# Patient Record
Sex: Female | Born: 1970 | Race: White | Hispanic: No | Marital: Married | Smoking: Never smoker
Health system: Southern US, Community
[De-identification: ages and names within clinical notes are randomized; demographics above are authoritative.]

## PROBLEM LIST (undated history)

## (undated) DIAGNOSIS — K635 Polyp of colon: Secondary | ICD-10-CM

## (undated) DIAGNOSIS — R8789 Other abnormal findings in specimens from female genital organs: Secondary | ICD-10-CM

## (undated) DIAGNOSIS — F419 Anxiety disorder, unspecified: Secondary | ICD-10-CM

## (undated) DIAGNOSIS — Q211 Atrial septal defect: Secondary | ICD-10-CM

## (undated) DIAGNOSIS — T753XXA Motion sickness, initial encounter: Secondary | ICD-10-CM

## (undated) DIAGNOSIS — Z9889 Other specified postprocedural states: Secondary | ICD-10-CM

## (undated) DIAGNOSIS — K219 Gastro-esophageal reflux disease without esophagitis: Secondary | ICD-10-CM

## (undated) DIAGNOSIS — I639 Cerebral infarction, unspecified: Secondary | ICD-10-CM

## (undated) DIAGNOSIS — Q2112 Patent foramen ovale: Secondary | ICD-10-CM

## (undated) DIAGNOSIS — Z8619 Personal history of other infectious and parasitic diseases: Secondary | ICD-10-CM

## (undated) DIAGNOSIS — T8859XA Other complications of anesthesia, initial encounter: Secondary | ICD-10-CM

## (undated) DIAGNOSIS — T4145XA Adverse effect of unspecified anesthetic, initial encounter: Secondary | ICD-10-CM

## (undated) DIAGNOSIS — D649 Anemia, unspecified: Secondary | ICD-10-CM

## (undated) DIAGNOSIS — T8339XA Other mechanical complication of intrauterine contraceptive device, initial encounter: Secondary | ICD-10-CM

## (undated) DIAGNOSIS — Z803 Family history of malignant neoplasm of breast: Secondary | ICD-10-CM

## (undated) DIAGNOSIS — Z8632 Personal history of gestational diabetes: Secondary | ICD-10-CM

## (undated) DIAGNOSIS — Z973 Presence of spectacles and contact lenses: Secondary | ICD-10-CM

## (undated) DIAGNOSIS — J42 Unspecified chronic bronchitis: Secondary | ICD-10-CM

## (undated) DIAGNOSIS — J45909 Unspecified asthma, uncomplicated: Secondary | ICD-10-CM

## (undated) DIAGNOSIS — R7303 Prediabetes: Secondary | ICD-10-CM

## (undated) DIAGNOSIS — M1990 Unspecified osteoarthritis, unspecified site: Secondary | ICD-10-CM

## (undated) DIAGNOSIS — R87610 Atypical squamous cells of undetermined significance on cytologic smear of cervix (ASC-US): Secondary | ICD-10-CM

## (undated) DIAGNOSIS — G43909 Migraine, unspecified, not intractable, without status migrainosus: Secondary | ICD-10-CM

## (undated) DIAGNOSIS — R739 Hyperglycemia, unspecified: Secondary | ICD-10-CM

## (undated) DIAGNOSIS — R0789 Other chest pain: Secondary | ICD-10-CM

## (undated) DIAGNOSIS — R87612 Low grade squamous intraepithelial lesion on cytologic smear of cervix (LGSIL): Secondary | ICD-10-CM

## (undated) DIAGNOSIS — M5020 Other cervical disc displacement, unspecified cervical region: Secondary | ICD-10-CM

## (undated) DIAGNOSIS — Z8041 Family history of malignant neoplasm of ovary: Secondary | ICD-10-CM

## (undated) DIAGNOSIS — M722 Plantar fascial fibromatosis: Secondary | ICD-10-CM

## (undated) HISTORY — DX: Other cervical disc displacement, unspecified cervical region: M50.20

## (undated) HISTORY — PX: CERVICAL CONIZATION W/BX: SHX1330

## (undated) HISTORY — DX: Other abnormal findings in specimens from female genital organs: R87.89

## (undated) HISTORY — DX: Low grade squamous intraepithelial lesion on cytologic smear of cervix (LGSIL): R87.612

## (undated) HISTORY — DX: Other chest pain: R07.89

## (undated) HISTORY — DX: Atypical squamous cells of undetermined significance on cytologic smear of cervix (ASC-US): R87.610

## (undated) HISTORY — PX: KNEE SURGERY: SHX244

## (undated) HISTORY — DX: Personal history of gestational diabetes: Z86.32

## (undated) HISTORY — DX: Plantar fascial fibromatosis: M72.2

## (undated) HISTORY — DX: Family history of malignant neoplasm of ovary: Z80.41

## (undated) HISTORY — DX: Family history of malignant neoplasm of breast: Z80.3

---

## 2007-01-08 ENCOUNTER — Ambulatory Visit: Payer: Self-pay | Admitting: Specialist

## 2007-01-26 ENCOUNTER — Ambulatory Visit: Payer: Self-pay | Admitting: Specialist

## 2007-10-16 ENCOUNTER — Ambulatory Visit: Payer: Self-pay | Admitting: Obstetrics and Gynecology

## 2007-11-29 ENCOUNTER — Ambulatory Visit: Payer: Self-pay | Admitting: Obstetrics and Gynecology

## 2008-01-15 ENCOUNTER — Observation Stay: Payer: Self-pay | Admitting: Obstetrics and Gynecology

## 2008-02-26 ENCOUNTER — Ambulatory Visit: Payer: Self-pay | Admitting: Obstetrics and Gynecology

## 2008-03-03 ENCOUNTER — Inpatient Hospital Stay: Payer: Self-pay | Admitting: Obstetrics and Gynecology

## 2008-04-08 ENCOUNTER — Ambulatory Visit: Payer: Self-pay | Admitting: Obstetrics and Gynecology

## 2010-01-19 ENCOUNTER — Ambulatory Visit: Payer: Self-pay | Admitting: Unknown Physician Specialty

## 2010-08-05 ENCOUNTER — Emergency Department: Payer: Self-pay | Admitting: Emergency Medicine

## 2010-11-01 ENCOUNTER — Observation Stay: Payer: Self-pay | Admitting: Obstetrics and Gynecology

## 2010-12-20 ENCOUNTER — Ambulatory Visit: Payer: Self-pay | Admitting: Obstetrics and Gynecology

## 2010-12-31 ENCOUNTER — Ambulatory Visit: Payer: Self-pay | Admitting: Obstetrics and Gynecology

## 2011-01-17 ENCOUNTER — Observation Stay: Payer: Self-pay

## 2011-01-30 ENCOUNTER — Ambulatory Visit: Payer: Self-pay | Admitting: Obstetrics and Gynecology

## 2011-02-11 ENCOUNTER — Inpatient Hospital Stay: Payer: Self-pay | Admitting: Internal Medicine

## 2012-09-20 DIAGNOSIS — R87612 Low grade squamous intraepithelial lesion on cytologic smear of cervix (LGSIL): Secondary | ICD-10-CM

## 2012-09-20 HISTORY — DX: Low grade squamous intraepithelial lesion on cytologic smear of cervix (LGSIL): R87.612

## 2013-01-30 DIAGNOSIS — R87610 Atypical squamous cells of undetermined significance on cytologic smear of cervix (ASC-US): Secondary | ICD-10-CM

## 2013-01-30 HISTORY — DX: Atypical squamous cells of undetermined significance on cytologic smear of cervix (ASC-US): R87.610

## 2013-03-21 ENCOUNTER — Ambulatory Visit: Payer: Self-pay | Admitting: Obstetrics & Gynecology

## 2013-04-01 ENCOUNTER — Ambulatory Visit: Payer: Self-pay | Admitting: Obstetrics & Gynecology

## 2013-04-11 ENCOUNTER — Observation Stay: Payer: Self-pay | Admitting: Obstetrics and Gynecology

## 2013-05-06 ENCOUNTER — Inpatient Hospital Stay: Payer: Self-pay

## 2013-05-06 LAB — CBC WITH DIFFERENTIAL/PLATELET
Basophil #: 0.1 10*3/uL (ref 0.0–0.1)
Basophil %: 0.5 %
Eosinophil #: 0.1 10*3/uL (ref 0.0–0.7)
Eosinophil %: 0.5 %
HGB: 13.7 g/dL (ref 12.0–16.0)
Lymphocyte #: 3 10*3/uL (ref 1.0–3.6)
MCH: 30 pg (ref 26.0–34.0)
MCHC: 34.5 g/dL (ref 32.0–36.0)
Neutrophil #: 9.1 10*3/uL — ABNORMAL HIGH (ref 1.4–6.5)
Platelet: 187 10*3/uL (ref 150–440)

## 2013-05-07 DIAGNOSIS — O24419 Gestational diabetes mellitus in pregnancy, unspecified control: Secondary | ICD-10-CM

## 2013-05-08 LAB — HEMATOCRIT: HCT: 31.8 % — ABNORMAL LOW (ref 35.0–47.0)

## 2014-09-03 ENCOUNTER — Ambulatory Visit (INDEPENDENT_AMBULATORY_CARE_PROVIDER_SITE_OTHER): Payer: PRIVATE HEALTH INSURANCE

## 2014-09-03 ENCOUNTER — Ambulatory Visit (INDEPENDENT_AMBULATORY_CARE_PROVIDER_SITE_OTHER): Payer: PRIVATE HEALTH INSURANCE | Admitting: Podiatry

## 2014-09-03 ENCOUNTER — Encounter: Payer: Self-pay | Admitting: Podiatry

## 2014-09-03 VITALS — BP 112/68 | HR 73 | Resp 16 | Ht 64.0 in | Wt 185.0 lb

## 2014-09-03 DIAGNOSIS — M722 Plantar fascial fibromatosis: Secondary | ICD-10-CM

## 2014-09-03 MED ORDER — MELOXICAM 15 MG PO TABS: 15.0000 mg | ORAL_TABLET | Freq: Every day | ORAL | Status: DC

## 2014-09-03 MED ORDER — METHYLPREDNISOLONE (PAK) 4 MG PO TABS: ORAL_TABLET | ORAL | Status: DC

## 2014-09-03 NOTE — Patient Instructions (Signed)

## 2014-09-03 NOTE — Progress Notes (Signed)
   Subjective:    Patient ID: Tammy PontiffAngelic R Sosa, female    DOB: 12/02/1970, 44 y.o.   MRN: [id number]  HPI Comments: Left heel pain for a couple of months , only getting worse. Can barely put foot down on the ground. It is throbbing now sitting in this chair   Foot Pain      Review of Systems  All other systems reviewed and are negative.      Objective:   Physical Exam: I have reviewed her past medical history medications allergies surgery social history and review of systems. Pulses are strongly palpable bilateral. Neurologic sensorium is intact per Semmes-Weinstein monofilament. Deep tendon reflexes are intact bilateral and muscle strength is 5 over 5 dorsiflexion plantar flexors and inverters everters all just musculature is intact. [facility] on palpation medially continue tubercle of the left heel. Radiographic evaluation Mr. is a soft tissue increase in density of the plantar fascial insertion site of the left heel. Otherwise the foot appears to be rectus with normal osseous architecture.        Assessment & Plan:  Assessment: Plantar fasciitis left.  Plan: We discussed the etiology pathology conservative versus surgical therapies. We discussed appropriate shoe gear stretching exercises ice therapy and shoe modifications. I injected her left heel with Kenalog and local anesthetic today. Placed her in a plantar fascial brace and a night splint. We discussed medications which will include a Medrol Dosepak to be followed by meloxicam. I encouraged her to wear appropriate shoe gear and discussed that in detail I will follow-up with her in 1 month

## 2014-10-01 ENCOUNTER — Encounter: Payer: Self-pay | Admitting: Podiatry

## 2014-10-01 ENCOUNTER — Ambulatory Visit (INDEPENDENT_AMBULATORY_CARE_PROVIDER_SITE_OTHER): Payer: PRIVATE HEALTH INSURANCE | Admitting: Podiatry

## 2014-10-01 VITALS — BP 84/55 | HR 69 | Resp 16

## 2014-10-01 DIAGNOSIS — M722 Plantar fascial fibromatosis: Secondary | ICD-10-CM

## 2014-10-01 MED ORDER — DICLOFENAC SODIUM 75 MG PO TBEC: 75.0000 mg | DELAYED_RELEASE_TABLET | Freq: Two times a day (BID) | ORAL | Status: DC

## 2014-10-01 NOTE — Progress Notes (Signed)
She presents today for follow-up of her plantar fasciitis left heel. She states it really has not helped a whole lot.  Objective: Vital signs are stable she is alert and oriented 3. Pulses are palpable left foot. On palpation medial calcaneal tubercle of the left heel.  Assessment: Plantar fasciitis of the left heel.  Plan: Encouraged her to try to use the night splint which she states she's not been using. I also encouraged her to wear her plantar fascial brace. Reinject the left heel today with Kenalog and local anesthetic in dispensed a prescription for diclofenac and we'll discontinue meloxicam. Follow-up with me in 1 month at which time we may need to consider orthotics.

## 2014-11-05 ENCOUNTER — Encounter: Payer: Self-pay | Admitting: Podiatry

## 2014-11-05 ENCOUNTER — Ambulatory Visit (INDEPENDENT_AMBULATORY_CARE_PROVIDER_SITE_OTHER): Payer: PRIVATE HEALTH INSURANCE | Admitting: Podiatry

## 2014-11-05 VITALS — BP 110/64 | HR 70 | Resp 16

## 2014-11-05 DIAGNOSIS — M722 Plantar fascial fibromatosis: Secondary | ICD-10-CM

## 2014-11-05 NOTE — Progress Notes (Signed)
She presents today for follow-up of plantar fasciitis to her left heel. She states that she does not want another injection. She states that she is feeling much better but she still has some pain.  Objective: Pulses are palpable left foot. She has pain on palpation medial calcaneal tubercle of the left heel.  Assessment: Biomechanical-induced chronic optimal plantar fasciitis left.  Plan: She is For set of orthotics today.

## 2014-12-01 ENCOUNTER — Ambulatory Visit (INDEPENDENT_AMBULATORY_CARE_PROVIDER_SITE_OTHER): Payer: PRIVATE HEALTH INSURANCE | Admitting: *Deleted

## 2014-12-01 DIAGNOSIS — M722 Plantar fascial fibromatosis: Secondary | ICD-10-CM

## 2014-12-01 NOTE — Progress Notes (Signed)
Orthotics dispensed. Gradual breakin instructions given. Recheck 1 month, if needed.

## 2014-12-01 NOTE — Patient Instructions (Signed)

## 2014-12-09 NOTE — H&P (Signed)
L&D Evaluation:  History:  HPI 44 yo [id number] at 9784w2d gestation by D=6wk US derived EDC of 05/21/2013 presenting after being seen in [facility] clinic today and reporting contraction every 5 minutes.  The patient was not checked at the time of her clinic visit. On arrival she was noted to be 1.5cm dilated.  Contractions have been off an on for the past 24-hrs.  No LOF, no VB, +FM.  [facility], AMA, GDM currently on glyburide 2.5mg  qHS.  US 04/11/13 2792 (6lbs 2oz) c/w 72%ile, and afi of 18cm.   Presents with contractions   Patient's Medical History No Chronic Illness   Patient's Surgical History knee surgery   Medications Pre Natal Vitamins  glyburide 2.5mg  qHS   Allergies NKDA   Social History none   Family History Non-Contributory   ROS:  ROS All systems were reviewed.  HEENT, CNS, GI, GU, Respiratory, CV, Renal and Musculoskeletal systems were found to be normal.   Exam:  Vital Signs stable   Urine Protein not completed   General no apparent distress   Mental Status clear   Chest no increased work of breathing   Abdomen gravid, non-tender   Estimated Fetal Weight Average for gestational age   Fetal Position vtx   Edema no edema   Pelvic no external lesions, 1.5/50/hi unchanged over 2-hrs   Mebranes Intact   FHT normal rate with no decels, negative contraction stress test   Ucx irregular   Skin q5-217min spaced out to q7510min on discharge   Impression:  Impression Braxton Hicks contractions   Plan:  Comments - routine labor precautions - follow up in clinic next week   Follow Up Appointment already scheduled   Electronic Signatures: Lorrene ReidStaebler, Merranda Bolls M (MD)  (Signed 12-Sep-14 21:16)  Authored: L&D Evaluation   Last Updated: 12-Sep-14 21:16 by Lorrene ReidStaebler, Mykle Pascua M (MD)

## 2014-12-09 NOTE — H&P (Signed)
L&D Evaluation:  History:  HPI 44 yo [id number] at 38 weeks who presents to L&D with c/o contractions. She reports +FM, denies vb or lof. She is O+, VI, RI, and GBS-. Her prenatal course is significant for gestational diabetes requiring glyburide daily for BG control, and AMA.   Presents with abdominal pain   Patient's Medical History No Chronic Illness  AMA   Patient's Surgical History knee surgery   Medications Pre Natal Vitamins   Allergies NKDA   Social History none   Family History Non-Contributory   ROS:  ROS All systems were reviewed.  HEENT, CNS, GI, GU, Respiratory, CV, Renal and Musculoskeletal systems were found to be normal.   Exam:  Vital Signs stable   General no apparent distress   Mental Status clear   Chest clear   Heart normal sinus rhythm   Abdomen gravid, tender with contractions   Back no CVAT   Pelvic no external lesions, cervix 2.5/70/0 on admission- at 2115- cervix 4/85/0   Mebranes Intact   FHT normal rate with no decels   Ucx regular, 2 min apart   Skin dry   Lymph no lymphadenopathy   Impression:  Impression active labor, reactive NST, IUP at 38 weeks, GDM   Plan:  Plan admit for labor   Follow Up Appointment need to schedule   Electronic Signatures: Jannet MantisSubudhi, Gethsemane Fischler (CNM)  (Signed 06-Oct-14 21:47)  Authored: L&D Evaluation   Last Updated: 06-Oct-14 21:47 by Jannet MantisSubudhi, Yuki Brunsman (CNM)

## 2014-12-11 DIAGNOSIS — R87618 Other abnormal cytological findings on specimens from cervix uteri: Secondary | ICD-10-CM

## 2014-12-11 HISTORY — DX: Other abnormal cytological findings on specimens from cervix uteri: R87.618

## 2014-12-11 LAB — HM PAP SMEAR: HM Pap smear: POSITIVE

## 2015-01-01 ENCOUNTER — Encounter: Payer: Self-pay | Admitting: Podiatry

## 2015-01-01 ENCOUNTER — Ambulatory Visit (INDEPENDENT_AMBULATORY_CARE_PROVIDER_SITE_OTHER): Payer: PRIVATE HEALTH INSURANCE | Admitting: Podiatry

## 2015-01-01 VITALS — BP 136/91 | HR 83 | Resp 16

## 2015-01-01 DIAGNOSIS — M722 Plantar fascial fibromatosis: Secondary | ICD-10-CM

## 2015-01-01 MED ORDER — DICLOFENAC SODIUM 1 % TD GEL
2.0000 g | Freq: Four times a day (QID) | TRANSDERMAL | Status: DC
Start: 2015-01-01 — End: 2017-08-25

## 2015-01-01 NOTE — Patient Instructions (Signed)
Plantar Fasciitis (Heel Spur Syndrome) with Rehab The plantar fascia is a fibrous, ligament-like, soft-tissue structure that spans the bottom of the foot. Plantar fasciitis is a condition that causes pain in the foot due to inflammation of the tissue. SYMPTOMS   Pain and tenderness on the underneath side of the foot.  Pain that worsens with standing or walking. CAUSES  Plantar fasciitis is caused by irritation and injury to the plantar fascia on the underneath side of the foot. Common mechanisms of injury include:  Direct trauma to bottom of the foot.  Damage to a small nerve that runs under the foot where the main fascia attaches to the heel bone.  Stress placed on the plantar fascia due to bone spurs. RISK INCREASES WITH:   Activities that place stress on the plantar fascia (running, jumping, pivoting, or cutting).  Poor strength and flexibility.  Improperly fitted shoes.  Tight calf muscles.  Flat feet.  Failure to warm-up properly before activity.  Obesity. PREVENTION  Warm up and stretch properly before activity.  Allow for adequate recovery between workouts.  Maintain physical fitness:  Strength, flexibility, and endurance.  Cardiovascular fitness.  Maintain a health body weight.  Avoid stress on the plantar fascia.  Wear properly fitted shoes, including arch supports for individuals who have flat feet. PROGNOSIS  If treated properly, then the symptoms of plantar fasciitis usually resolve without surgery. However, occasionally surgery is necessary. RELATED COMPLICATIONS   Recurrent symptoms that may result in a chronic condition.  Problems of the lower back that are caused by compensating for the injury, such as limping.  Pain or weakness of the foot during push-off following surgery.  Chronic inflammation, scarring, and partial or complete fascia tear, occurring more often from repeated injections. TREATMENT  Treatment initially involves the use of  ice and medication to help reduce pain and inflammation. The use of strengthening and stretching exercises may help reduce pain with activity, especially stretches of the Achilles tendon. These exercises may be performed at home or with a therapist. Your caregiver may recommend that you use heel cups of arch supports to help reduce stress on the plantar fascia. Occasionally, corticosteroid injections are given to reduce inflammation. If symptoms persist for greater than 6 months despite non-surgical (conservative), then surgery may be recommended.  MEDICATION   If pain medication is necessary, then nonsteroidal anti-inflammatory medications, such as aspirin and ibuprofen, or other minor pain relievers, such as acetaminophen, are often recommended.  Do not take pain medication within 7 days before surgery.  Prescription pain relievers may be given if deemed necessary by your caregiver. Use only as directed and only as much as you need.  Corticosteroid injections may be given by your caregiver. These injections should be reserved for the most serious cases, because they may only be given a certain number of times. HEAT AND COLD  Cold treatment (icing) relieves pain and reduces inflammation. Cold treatment should be applied for 10 to 15 minutes every 2 to 3 hours for inflammation and pain and immediately after any activity that aggravates your symptoms. Use ice packs or massage the area with a piece of ice (ice massage).  Heat treatment may be used prior to performing the stretching and strengthening activities prescribed by your caregiver, physical therapist, or athletic trainer. Use a heat pack or soak the injury in warm water. SEEK IMMEDIATE MEDICAL CARE IF:  Treatment seems to offer no benefit, or the condition worsens.  Any medications produce adverse side effects. EXERCISES RANGE   OF MOTION (ROM) AND STRETCHING EXERCISES - Plantar Fasciitis (Heel Spur Syndrome) These exercises may help you  when beginning to rehabilitate your injury. Your symptoms may resolve with or without further involvement from your physician, physical therapist or athletic trainer. While completing these exercises, remember:   Restoring tissue flexibility helps normal motion to return to the joints. This allows healthier, less painful movement and activity.  An effective stretch should be held for at least 30 seconds.  A stretch should never be painful. You should only feel a gentle lengthening or release in the stretched tissue. RANGE OF MOTION - Toe Extension, Flexion  Sit with your right / left leg crossed over your opposite knee.  Grasp your toes and gently pull them back toward the top of your foot. You should feel a stretch on the bottom of your toes and/or foot.  Hold this stretch for __________ seconds.  Now, gently pull your toes toward the bottom of your foot. You should feel a stretch on the top of your toes and or foot.  Hold this stretch for __________ seconds. Repeat __________ times. Complete this stretch __________ times per day.  RANGE OF MOTION - Ankle Dorsiflexion, Active Assisted  Remove shoes and sit on a chair that is preferably not on a carpeted surface.  Place right / left foot under knee. Extend your opposite leg for support.  Keeping your heel down, slide your right / left foot back toward the chair until you feel a stretch at your ankle or calf. If you do not feel a stretch, slide your bottom forward to the edge of the chair, while still keeping your heel down.  Hold this stretch for __________ seconds. Repeat __________ times. Complete this stretch __________ times per day.  STRETCH - Gastroc, Standing  Place hands on wall.  Extend right / left leg, keeping the front knee somewhat bent.  Slightly point your toes inward on your back foot.  Keeping your right / left heel on the floor and your knee straight, shift your weight toward the wall, not allowing your back to  arch.  You should feel a gentle stretch in the right / left calf. Hold this position for __________ seconds. Repeat __________ times. Complete this stretch __________ times per day. STRETCH - Soleus, Standing  Place hands on wall.  Extend right / left leg, keeping the other knee somewhat bent.  Slightly point your toes inward on your back foot.  Keep your right / left heel on the floor, bend your back knee, and slightly shift your weight over the back leg so that you feel a gentle stretch deep in your back calf.  Hold this position for __________ seconds. Repeat __________ times. Complete this stretch __________ times per day. STRETCH - Gastrocsoleus, Standing  Note: This exercise can place a lot of stress on your foot and ankle. Please complete this exercise only if specifically instructed by your caregiver.   Place the ball of your right / left foot on a step, keeping your other foot firmly on the same step.  Hold on to the wall or a rail for balance.  Slowly lift your other foot, allowing your body weight to press your heel down over the edge of the step.  You should feel a stretch in your right / left calf.  Hold this position for __________ seconds.  Repeat this exercise with a slight bend in your right / left knee. Repeat __________ times. Complete this stretch __________ times per day.    STRENGTHENING EXERCISES - Plantar Fasciitis (Heel Spur Syndrome)  These exercises may help you when beginning to rehabilitate your injury. They may resolve your symptoms with or without further involvement from your physician, physical therapist or athletic trainer. While completing these exercises, remember:   Muscles can gain both the endurance and the strength needed for everyday activities through controlled exercises.  Complete these exercises as instructed by your physician, physical therapist or athletic trainer. Progress the resistance and repetitions only as guided. STRENGTH -  Towel Curls  Sit in a chair positioned on a non-carpeted surface.  Place your foot on a towel, keeping your heel on the floor.  Pull the towel toward your heel by only curling your toes. Keep your heel on the floor.  If instructed by your physician, physical therapist or athletic trainer, add ____________________ at the end of the towel. Repeat __________ times. Complete this exercise __________ times per day. STRENGTH - Ankle Inversion  Secure one end of a rubber exercise band/tubing to a fixed object (table, pole). Loop the other end around your foot just before your toes.  Place your fists between your knees. This will focus your strengthening at your ankle.  Slowly, pull your big toe up and in, making sure the band/tubing is positioned to resist the entire motion.  Hold this position for __________ seconds.  Have your muscles resist the band/tubing as it slowly pulls your foot back to the starting position. Repeat __________ times. Complete this exercises __________ times per day.  Document Released: 07/18/2005 Document Revised: 10/10/2011 Document Reviewed: 10/30/2008 [employer] Patient Information 2015 [employer]. This information is not intended to replace advice given to you by your health care provider. Make sure you discuss any questions you have with your health care provider.  

## 2015-01-02 NOTE — Progress Notes (Signed)
Patient ID: Tammy Sosa, female   DOB: 08/13/1970, 44 y.o.   MRN: [id number]  Subjective: 44 year old female presents the office they for follow-up evaluation of left foot plantar fasciitis. She states that she continues have pain to her left heel worse in the morning or after sustaining for long periods of time. She previously had 3 steroid injections were heel, tried Voltaren, mobile, stretching, icing without much relief. She didn't get the orthotics last appointment and she states they do help some all that she does continue to have the pain. She states that since getting orthotics she has discontinued wearing the night splint, icing, stretching or any anti-inflammatory medications. She denies a swelling or redness. Denies numbness or tingling. The pain does not wake her up at night. Denies any history of injury or trauma denies any change or increase in activity recently. No other complaints at this time in no acute changes.  Objective: AAO x3, NAD DP/PT pulses palpable bilaterally, CRT less than 3 seconds Protective sensation intact with Simms Weinstein monofilament, vibratory sensation intact, Achilles tendon reflex intact Tenderness to palpation overlying the plantar medial tubercle of the calcaneus to left heel at the insertion of the plantar fascia. There is no pain along the course of plantar fascia within the arch of the foot and the plantar fascia appears intact. There is no pain with lateral compression of the calcaneus or pain the vibratory sensation. No pain on the posterior aspect of the calcaneus or along the course/insertion of the Achilles tendon. There is no overlying edema, erythema, increase in warmth. No other areas of tenderness palpation or pain with vibratory sensation to the foot/ankle. MMT 5/5, ROM WNL No open lesions or pre-ulcerative lesions are identified. No pain with calf compression, swelling, warmth, erythema.  Assessment: 44 year old female with chronic left heel  pain, plantar fasciitis  Plan: -Treatment options discussed including all alternatives, risks, and complications -Recommended patient contacted his stretching, icing on a consistent basis. Also when the night splint at night. Continue with the orthotics. Anti-inflammatory as needed. Prescribed Voltaren gel as well. -Also discussed physical therapy, EPAT with the patient. She will continue other conservative treatments for how and if her pain is not better in 3 weeks, will likely refer to PT. in the meantime I encouraged her to call the office with questions, concerns, change in symptoms.

## 2015-01-22 ENCOUNTER — Ambulatory Visit: Payer: PRIVATE HEALTH INSURANCE | Admitting: Podiatry

## 2015-08-02 DIAGNOSIS — I639 Cerebral infarction, unspecified: Secondary | ICD-10-CM

## 2015-08-02 HISTORY — DX: Cerebral infarction, unspecified: I63.9

## 2015-08-22 ENCOUNTER — Emergency Department: Payer: BLUE CROSS/BLUE SHIELD

## 2015-08-22 ENCOUNTER — Inpatient Hospital Stay
Admission: EM | Admit: 2015-08-22 | Discharge: 2015-08-25 | DRG: 065 | Disposition: A | Discharge status: Home / Self Care | Payer: BLUE CROSS/BLUE SHIELD | Attending: Internal Medicine | Admitting: Internal Medicine

## 2015-08-22 ENCOUNTER — Encounter: Payer: Self-pay | Admitting: Emergency Medicine

## 2015-08-22 DIAGNOSIS — M6289 Other specified disorders of muscle: Secondary | ICD-10-CM | POA: Insufficient documentation

## 2015-08-22 DIAGNOSIS — R29898 Other symptoms and signs involving the musculoskeletal system: Secondary | ICD-10-CM

## 2015-08-22 DIAGNOSIS — R531 Weakness: Secondary | ICD-10-CM

## 2015-08-22 DIAGNOSIS — Z9889 Other specified postprocedural states: Secondary | ICD-10-CM

## 2015-08-22 DIAGNOSIS — I639 Cerebral infarction, unspecified: Principal | ICD-10-CM | POA: Diagnosis present

## 2015-08-22 DIAGNOSIS — R519 Headache, unspecified: Secondary | ICD-10-CM

## 2015-08-22 DIAGNOSIS — Z8673 Personal history of transient ischemic attack (TIA), and cerebral infarction without residual deficits: Secondary | ICD-10-CM | POA: Diagnosis present

## 2015-08-22 DIAGNOSIS — J45909 Unspecified asthma, uncomplicated: Secondary | ICD-10-CM | POA: Diagnosis present

## 2015-08-22 DIAGNOSIS — R609 Edema, unspecified: Secondary | ICD-10-CM

## 2015-08-22 DIAGNOSIS — G43909 Migraine, unspecified, not intractable, without status migrainosus: Secondary | ICD-10-CM | POA: Diagnosis present

## 2015-08-22 DIAGNOSIS — Z8249 Family history of ischemic heart disease and other diseases of the circulatory system: Secondary | ICD-10-CM

## 2015-08-22 DIAGNOSIS — Z79899 Other long term (current) drug therapy: Secondary | ICD-10-CM

## 2015-08-22 DIAGNOSIS — R51 Headache: Secondary | ICD-10-CM

## 2015-08-22 DIAGNOSIS — Z7982 Long term (current) use of aspirin: Secondary | ICD-10-CM

## 2015-08-22 DIAGNOSIS — Z823 Family history of stroke: Secondary | ICD-10-CM

## 2015-08-22 DIAGNOSIS — G8191 Hemiplegia, unspecified affecting right dominant side: Secondary | ICD-10-CM | POA: Diagnosis present

## 2015-08-22 HISTORY — DX: Unspecified asthma, uncomplicated: J45.909

## 2015-08-22 LAB — LIPID PANEL
Cholesterol: 158 mg/dL (ref 0–200)
HDL: 40 mg/dL — AB (ref >40)
LDL CALC: 108 mg/dL — AB (ref 0–99)
Total CHOL/HDL Ratio: 4 RATIO
Triglycerides: 48 mg/dL (ref <150)
VLDL: 10 mg/dL (ref 0–40)

## 2015-08-22 LAB — URINALYSIS COMPLETE WITH MICROSCOPIC (ARMC ONLY)
BILIRUBIN URINE: NEGATIVE
BILIRUBIN URINE: NEGATIVE
Glucose, UA: NEGATIVE mg/dL
Glucose, UA: NEGATIVE mg/dL
NITRITE: NEGATIVE
Nitrite: NEGATIVE
PH: 6 (ref 5.0–8.0)
PH: 6 (ref 5.0–8.0)
Protein, ur: NEGATIVE mg/dL
Protein, ur: NEGATIVE mg/dL
SPECIFIC GRAVITY, URINE: 1.006 (ref 1.005–1.030)
Specific Gravity, Urine: 1.006 (ref 1.005–1.030)

## 2015-08-22 LAB — CBC
HCT: 36.8 % (ref 35.0–47.0)
HEMOGLOBIN: 12.5 g/dL (ref 12.0–16.0)
MCH: 28.8 pg (ref 26.0–34.0)
MCHC: 33.9 g/dL (ref 32.0–36.0)
MCV: 84.9 fL (ref 80.0–100.0)
PLATELETS: 252 10*3/uL (ref 150–440)
RBC: 4.34 MIL/uL (ref 3.80–5.20)
RDW: 14.5 % (ref 11.5–14.5)
WBC: 6.7 10*3/uL (ref 3.6–11.0)

## 2015-08-22 LAB — DIFFERENTIAL
BASOS ABS: 0.1 10*3/uL (ref 0–0.1)
Basophils Relative: 2 %
EOS ABS: 0.1 10*3/uL (ref 0–0.7)
Eosinophils Relative: 2 %
LYMPHS ABS: 2.5 10*3/uL (ref 1.0–3.6)
LYMPHS PCT: 38 %
Monocytes Absolute: 0.4 10*3/uL (ref 0.2–0.9)
Monocytes Relative: 6 %
NEUTROS PCT: 52 %
Neutro Abs: 3.5 10*3/uL (ref 1.4–6.5)

## 2015-08-22 LAB — COMPREHENSIVE METABOLIC PANEL
ALBUMIN: 4.3 g/dL (ref 3.5–5.0)
ALK PHOS: 49 U/L (ref 38–126)
ALT: 20 U/L (ref 14–54)
ANION GAP: 6 (ref 5–15)
AST: 25 U/L (ref 15–41)
BILIRUBIN TOTAL: 0.5 mg/dL (ref 0.3–1.2)
BUN: 11 mg/dL (ref 6–20)
CO2: 24 mmol/L (ref 22–32)
Calcium: 9 mg/dL (ref 8.9–10.3)
Chloride: 108 mmol/L (ref 101–111)
Creatinine, Ser: 0.79 mg/dL (ref 0.44–1.00)
GFR calc Af Amer: >60 mL/min (ref >60)
GFR calc non Af Amer: >60 mL/min (ref >60)
GLUCOSE: 95 mg/dL (ref 65–99)
POTASSIUM: 3.7 mmol/L (ref 3.5–5.1)
SODIUM: 138 mmol/L (ref 135–145)
TOTAL PROTEIN: 7.2 g/dL (ref 6.5–8.1)

## 2015-08-22 LAB — POCT PREGNANCY, URINE: Preg Test, Ur: NEGATIVE

## 2015-08-22 LAB — TROPONIN I: Troponin I: <0.03 ng/mL (ref <0.031)

## 2015-08-22 LAB — PROTIME-INR
INR: 1.03
PROTHROMBIN TIME: 13.7 s (ref 11.4–15.0)

## 2015-08-22 LAB — ETHANOL: Alcohol, Ethyl (B): <5 mg/dL (ref <5)

## 2015-08-22 LAB — APTT: APTT: 36 s (ref 24–36)

## 2015-08-22 LAB — SEDIMENTATION RATE: Sed Rate: 15 mm/hr (ref 0–20)

## 2015-08-22 MED ORDER — ACETAMINOPHEN 325 MG PO TABS
650.0000 mg | ORAL_TABLET | Freq: Four times a day (QID) | ORAL | Status: DC | PRN
  Administered 2015-08-23 – 2015-08-25 (×5): 650 mg via ORAL
  Filled 2015-08-22 (×5): qty 2

## 2015-08-22 MED ORDER — KETOROLAC TROMETHAMINE 30 MG/ML IJ SOLN
30.0000 mg | Freq: Once | INTRAMUSCULAR | Status: AC
  Administered 2015-08-22: 30 mg via INTRAVENOUS
  Filled 2015-08-22: qty 1

## 2015-08-22 MED ORDER — HYDROCODONE-ACETAMINOPHEN 5-325 MG PO TABS
1.0000 | ORAL_TABLET | ORAL | Status: DC | PRN
  Filled 2015-08-22: qty 2

## 2015-08-22 MED ORDER — ACETAMINOPHEN 650 MG RE SUPP: 650.0000 mg | Freq: Four times a day (QID) | RECTAL | Status: DC | PRN

## 2015-08-22 MED ORDER — DOCUSATE SODIUM 100 MG PO CAPS
100.0000 mg | ORAL_CAPSULE | Freq: Two times a day (BID) | ORAL | Status: DC
Start: 2015-08-22 — End: 2015-08-25
  Administered 2015-08-23: 100 mg via ORAL
  Filled 2015-08-22 (×5): qty 1

## 2015-08-22 MED ORDER — SODIUM CHLORIDE 0.9 % IJ SOLN
3.0000 mL | Freq: Two times a day (BID) | INTRAMUSCULAR | Status: DC
  Administered 2015-08-22 – 2015-08-24 (×5): 3 mL via INTRAVENOUS

## 2015-08-22 MED ORDER — ASPIRIN EC 81 MG PO TBEC
81.0000 mg | DELAYED_RELEASE_TABLET | Freq: Every day | ORAL | Status: DC
  Administered 2015-08-22 – 2015-08-23 (×2): 81 mg via ORAL
  Filled 2015-08-22 (×2): qty 1

## 2015-08-22 MED ORDER — SODIUM CHLORIDE 0.9 % IV BOLUS (SEPSIS)
1000.0000 mL | Freq: Once | INTRAVENOUS | Status: AC
  Administered 2015-08-22: 1000 mL via INTRAVENOUS

## 2015-08-22 MED ORDER — METOCLOPRAMIDE HCL 5 MG/ML IJ SOLN
10.0000 mg | Freq: Once | INTRAMUSCULAR | Status: AC
  Administered 2015-08-22: 10 mg via INTRAVENOUS
  Filled 2015-08-22: qty 2

## 2015-08-22 MED ORDER — ONDANSETRON HCL 4 MG PO TABS: 4.0000 mg | ORAL_TABLET | Freq: Four times a day (QID) | ORAL | Status: DC | PRN

## 2015-08-22 MED ORDER — HYDROMORPHONE HCL 1 MG/ML IJ SOLN
1.0000 mg | INTRAMUSCULAR | Status: DC | PRN
  Administered 2015-08-24: 07:00:00 1 mg via INTRAVENOUS
  Filled 2015-08-22: qty 1

## 2015-08-22 MED ORDER — BISACODYL 10 MG RE SUPP: 10.0000 mg | Freq: Every day | RECTAL | Status: DC | PRN

## 2015-08-22 MED ORDER — IOHEXOL 350 MG/ML SOLN
75.0000 mL | Freq: Once | INTRAVENOUS | Status: AC | PRN
  Administered 2015-08-22: 75 mL via INTRAVENOUS

## 2015-08-22 MED ORDER — HEPARIN SODIUM (PORCINE) 5000 UNIT/ML IJ SOLN
5000.0000 [IU] | Freq: Three times a day (TID) | INTRAMUSCULAR | Status: DC
  Administered 2015-08-22 – 2015-08-25 (×8): 5000 [IU] via SUBCUTANEOUS
  Filled 2015-08-22 (×8): qty 1

## 2015-08-22 MED ORDER — ONDANSETRON HCL 4 MG/2ML IJ SOLN
4.0000 mg | Freq: Four times a day (QID) | INTRAMUSCULAR | Status: DC | PRN
  Administered 2015-08-24: 4 mg via INTRAVENOUS
  Filled 2015-08-22: qty 2

## 2015-08-22 MED ORDER — DIPHENHYDRAMINE HCL 50 MG/ML IJ SOLN
12.5000 mg | Freq: Once | INTRAMUSCULAR | Status: AC
  Administered 2015-08-22: 12.5 mg via INTRAVENOUS
  Filled 2015-08-22: qty 1

## 2015-08-22 NOTE — ED Notes (Signed)
Patient transported to CT 

## 2015-08-22 NOTE — H&P (Signed)
History and Physical    Tammy Sosa:[id number] DOB: June 10, 1971 DOA: 08/22/2015  Referring physician: Dr. Inocencio Homes PCP: Towana Badger, MD  Specialists: none  Chief Complaint: HA with right-sided weakness  HPI: Tammy Sosa is a 45 y.o. female has a past medical history significant for migraines now with acute right-sided weakness causing her to drop to the ground associated with severe HA. In ER, weakness has resolved but she continues to c/o HA. Work up in ER negative thus far. She is now admitted. Has strong FH of CVA's at early age.  Review of Systems: The patient denies anorexia, fever, weight loss,, vision loss, decreased hearing, hoarseness, chest pain, syncope, dyspnea on exertion, peripheral edema, balance deficits, hemoptysis, abdominal pain, melena, hematochezia, severe indigestion/heartburn, hematuria, incontinence, genital sores, muscle weakness, suspicious skin lesions, transient blindness,  depression, unusual weight change, abnormal bleeding, enlarged lymph nodes, angioedema, and breast masses.   History reviewed. No pertinent past medical history. Past Surgical History  Procedure Laterality Date  . Knee surgery    . Knee surgery     Social History:  reports that she has never smoked. She does not have any smokeless tobacco history on file. She reports that she does not drink alcohol. Her drug history is not on file.  No Known Allergies  FH: positive for CVA and HTN, negative for breast or colon cancer. Negative for ASCVD  Prior to Admission medications   Medication Sig Start Date End Date Taking? Authorizing Provider  norethindrone (MICRONOR,CAMILA,ERRIN) 0.35 MG tablet Take 1 tablet by mouth daily. 08/16/15  Yes Historical Provider, MD  diclofenac sodium (VOLTAREN) 1 % GEL Apply 2 g topically 4 (four) times daily. Rub into affected area of foot 2 to 4 times daily 01/01/15   Vivi Barrack, DPM   Physical Exam: Filed Vitals:   08/22/15 1330 08/22/15 1400  08/22/15 1430 08/22/15 1500  BP: 130/95 127/76 119/73 118/72  Pulse: 65 54 63 58  Temp:      TempSrc:      Resp: Height:      Weight:      SpO2: 96% 100% 97% 96%     General:  No apparent distress  Eyes: PERRL, EOMI, no scleral icterus  ENT: moist oropharynx, dentition fair  Neck: supple, no lymphadenopathy. No thyromegaly or bruits  Cardiovascular: regular rate with 2/6 systolic murmur noted. No rubs or gallops.; 2+ peripheral pulses, no JVD, no peripheral edema  Respiratory: CTA biL, good air movement without wheezing, rhonchi or crackled  Abdomen: soft, non tender to palpation, positive bowel sounds, no guarding, no rebound  Skin: no rashes or lesions  Musculoskeletal: normal bulk and tone, no joint swelling  Psychiatric: normal mood and affect  Neurologic: CN 2-12 grossly intact, Motor strength 5/5 in all 4 groups with normal sensory exam. DTR's symmetric  Labs on Admission:  Basic Metabolic Panel:  Recent Labs Lab 08/22/15 1338  NA 138  K 3.7  CL 108  CO2 24  GLUCOSE 95  BUN 11  CREATININE 0.79  CALCIUM 9.0   Liver Function Tests:  Recent Labs Lab 08/22/15 1338  AST 25  ALT 20  ALKPHOS 49  BILITOT 0.5  PROT 7.2  ALBUMIN 4.3   No results for input(s): LIPASE, AMYLASE in the last 168 hours. No results for input(s): AMMONIA in the last 168 hours. CBC:  Recent Labs Lab 08/22/15 1338  WBC 6.7  NEUTROABS 3.5  HGB 12.5  HCT 36.8  MCV 84.9  PLT 252   Cardiac Enzymes:  Recent Labs Lab 08/22/15 1338  TROPONINI <0.03    BNP (last 3 results) No results for input(s): BNP in the last 8760 hours.  ProBNP (last 3 results) No results for input(s): PROBNP in the last 8760 hours.  CBG: No results for input(s): GLUCAP in the last 168 hours.  Radiological Exams on Admission: Ct Head Wo Contrast  08/22/2015  CLINICAL DATA:  Right-sided weakness EXAM: CT HEAD WITHOUT CONTRAST TECHNIQUE: Contiguous axial images were obtained from  the base of the skull through the vertex without intravenous contrast. COMPARISON:  01/19/2010 FINDINGS: The bony calvarium is intact. The ventricles are of normal size and configuration. No findings to suggest acute hemorrhage, acute infarction or space-occupying mass lesion are noted. IMPRESSION: No acute intracranial abnormality noted. These results were called by telephone at the time of interpretation on 08/22/2015 at 1:58 pm to Dr. Toney Rakes , who verbally acknowledged these results. Electronically Signed   By: Alcide Clever M.D.   On: 08/22/2015 13:59   Dg Chest Portable 1 View  08/22/2015  CLINICAL DATA:  Possible stroke. EXAM: PORTABLE CHEST 1 VIEW COMPARISON:  None. FINDINGS: Cardiomediastinal silhouette is normal. Mediastinal contours appear intact. There is no evidence of focal airspace consolidation, pleural effusion or pneumothorax. Osseous structures are without acute abnormality. Soft tissues are grossly normal. IMPRESSION: No active disease. Electronically Signed   By: Ted Mcalpine M.D.   On: 08/22/2015 15:44    EKG: Independently reviewed.  Assessment/Plan Principal Problem:   Right sided weakness Active Problems:   Headache   Will observe on floor and consult Neurology. Neuro checks q4h. Order echo and brain MRI. Monitor BP closely.  Diet: regular Fluids: saline lock DVT Prophylaxis: SQ Heparin  Code Status: FULL  Family Communication: yes  Disposition Plan: home  Time spent: 45 min

## 2015-08-22 NOTE — ED Provider Notes (Signed)
[facility] Emergency Department Provider Note  ____________________________________________  Time seen: Approximately 1:30 PM  I have reviewed the triage vital signs and the nursing notes.   HISTORY  Chief Complaint Stroke Symptoms    HPI Tammy Sosa is a 45 y.o. female with history of migraines who presents for evaluation of sudden onset right-sided weakness which began at 12:30 PM, sudden onset, initially severe, now mild, no modifying factors. The patient reports that she was in her kitchen making a smoothie when she dropped her smoothie cup because of sudden development of right arm numbness. She did crumple to the floor and her family noted that her "mouth was twisted". Tonight after that she developed left-sided headache with some "spots in her vision". She reports initially the headache was severe however now is reporting headache 5 out of 10. She report she has had migraines in the past and her pain today is somewhat similar to those. No chest pain or difficulty breathing. No vomiting, diarrhea, fevers or chills but she does take oral contraceptive pills. No family history of early coronary artery disease or early CVA though she does have strong family history of family members with strokes in their 50s-60s.   History reviewed. No pertinent past medical history.  There are no active problems to display for this patient.   Past Surgical History  Procedure Laterality Date  . Knee surgery    . Knee surgery      Current Outpatient Rx  Name  Route  Sig  Dispense  Refill  . diclofenac sodium (VOLTAREN) 1 % GEL   Topical   Apply 2 g topically 4 (four) times daily. Rub into affected area of foot 2 to 4 times daily   100 g   2     Allergies Review of patient's allergies indicates no known allergies.  History reviewed. No pertinent family history.  Social History Social History  Substance Use Topics  . Smoking status: Never Smoker   .  Smokeless tobacco: None  . Alcohol Use: No    Review of Systems Constitutional: No fever/chills Eyes: No visual changes. ENT: No sore throat. Cardiovascular: Denies chest pain. Respiratory: Denies shortness of breath. Gastrointestinal: No abdominal pain.  No nausea, no vomiting.  No diarrhea.  No constipation. Genitourinary: Negative for dysuria. Musculoskeletal: Negative for back pain. Skin: Negative for rash. Neurological: Positive for left sided headache and right arm weakness.  10-point ROS otherwise negative.  ____________________________________________   PHYSICAL EXAM:  VITAL SIGNS: ED Triage Vitals  Enc Vitals Group     BP 08/22/15 1329 129/68 mmHg     Pulse Rate 08/22/15 1329 64     Resp 08/22/15 1330 14     Temp 08/22/15 1329 98.1 F (36.7 C)     Temp Source 08/22/15 1329 Oral     SpO2 08/22/15 1329 100 %     Weight 08/22/15 1329 180 lb (81.647 kg)     Height 08/22/15 1329  (1.626 m)     Head Cir --      Peak Flow --      Pain Score 08/22/15 1330 10     Pain Loc --      Pain Edu? --      Excl. in GC? --     Constitutional: Alert and oriented. Well appearing and in no acute distress. Sitting up in bed. Pleasant, cooperative. Eyes: Conjunctivae are normal. PERRL. EOMI. Head: Atraumatic. Nose: No congestion/rhinnorhea. Mouth/Throat: Mucous membranes are moist.  Oropharynx non-erythematous. Neck: No stridor.  Cardiovascular: Normal rate, regular rhythm. Grossly normal heart sounds.  Good peripheral circulation. Respiratory: Normal respiratory effort.  No retractions. Lungs CTAB. Gastrointestinal: Soft and nontender. No distention. No abdominal bruits. No CVA tenderness. Genitourinary: deferred Musculoskeletal: No lower extremity tenderness nor edema.  No joint effusions. Neurologic:  Normal speech and language. There is 5 out of 5 strength in bilateral upper and lower extremities. Sensation intact to light touch throughout. Cranial nerves II through  XII intact. The patient does exhibit some drift of the right arm however there is no pronation and she has full strength on formal testing. Skin:  Skin is warm, dry and intact. No rash noted. Psychiatric: Mood and affect are normal. Speech and behavior are normal.  ____________________________________________   LABS (all labs ordered are listed, but only abnormal results are displayed)  Labs Reviewed  ETHANOL  PROTIME-INR  APTT  CBC  DIFFERENTIAL  COMPREHENSIVE METABOLIC PANEL  URINALYSIS COMPLETEWITH MICROSCOPIC ([facility] ONLY)  TROPONIN I  URINALYSIS COMPLETEWITH MICROSCOPIC ([facility] ONLY)  POC URINE PREG, ED  POC URINE PREG, ED  POCT PREGNANCY, URINE   ____________________________________________  EKG  ED ECG REPORT I, Gayla Doss, the attending physician, personally viewed and interpreted this ECG.   Date: 08/22/2015  EKG Time: 13:26  Rate: 68  Rhythm: normal sinus rhythm  Axis: normal  Intervals:none  ST&T Change: No acute ST elevation.  ____________________________________________  RADIOLOGY  CT head IMPRESSION: No acute intracranial abnormality noted.  These results were called by telephone at the time of interpretation on 08/22/2015 at 1:58 pm to Dr. Toney Rakes , who verbally acknowledged these results.  CXR - pending ____________________________________________   PROCEDURES  Procedure(s) performed: None  Critical Care performed: Yes, see critical care note(s). Total critical care time spent 35 minutes.  ____________________________________________   INITIAL IMPRESSION / ASSESSMENT AND PLAN / ED COURSE  Pertinent labs & imaging results that were available during my care of the patient were reviewed by me and considered in my medical decision making (see chart for details).  Tammy Sosa is a 45 y.o. female with history of migraines who presents for evaluation of sudden onset right-sided weakness which began at 12:30 PM, now significantly  improved. On exam, she is nontoxic. No acute distress. NIH stroke scale is 1 for some mild right arm drift and there is no pronation of the arm. The remainder of her neurological examination is intact. Vital signs are stable, she is afebrile. Code stroke initiated on arrival. CT head negative for any acute intracranial process. We'll obtain labs and consult specialist on call.  ----------------------------------------- 3:18 PM on 08/22/2015 ----------------------------------------- Case discussed with Dr.Yapundich, teleneurologist on-call who has evaluated the patient. He suspects symptoms are likely secondary to complicated migraine. Currently, her NIH stroke scale is 0. Given resolution of symptoms, she is not a candidate for TPA. He reports to me that the patient was complaining of a 10 out of 10 headache when he spoke with her. At this time, I have reassessed her (after his evaluation) and she reports that her headache has "passed" and has nearly completely resolved. She reports that the headache comes and goes in waves. She reports it comes on for a few minutes and is severe and then it resolves completely. She is complaining of some intermittent subjective right arm numbness. I doubt that this represents a subarachnoid hemorrhage. Her neck is supple without meningismus, she has no fever and clinical picture is not consistent with meningitis. Teleneurologist  on call recommends CT angio head for evaluation for a large vessel occlusion which could potentially benefit from emergent intervention. If CTA unremarkable, he recommends admission for full stroke workup to include echo and MRI MRA. We'll treat with migraine cocktail and anticipate admission. Labs reviewed. CBC and BMP are unremarkable. Negative pregnancy test. Care transferred to Dr. Barbette Reichmann at this time.  ____________________________________________   FINAL CLINICAL IMPRESSION(S) / ED DIAGNOSES  Final diagnoses:  Weakness  Right arm  weakness  Acute nonintractable headache, unspecified headache type      Gayla Doss, MD 08/22/15 [phone]

## 2015-08-22 NOTE — ED Notes (Signed)
Pt arrived by EMS with c/o rt side head pain. Family told EMS that pt was dropping things in the kitchen and had rt sided facial droop.Upon arrival EMS did not see facial droop and stated that Pt had no signs/symptoms of stroke.

## 2015-08-23 ENCOUNTER — Observation Stay: Payer: BLUE CROSS/BLUE SHIELD

## 2015-08-23 ENCOUNTER — Observation Stay
Admit: 2015-08-23 | Discharge: 2015-08-23 | Disposition: A | Discharge status: Home / Self Care | Payer: BLUE CROSS/BLUE SHIELD | Attending: Internal Medicine | Admitting: Internal Medicine

## 2015-08-23 DIAGNOSIS — Z79899 Other long term (current) drug therapy: Secondary | ICD-10-CM | POA: Diagnosis not present

## 2015-08-23 DIAGNOSIS — Z9889 Other specified postprocedural states: Secondary | ICD-10-CM | POA: Diagnosis not present

## 2015-08-23 DIAGNOSIS — M6289 Other specified disorders of muscle: Secondary | ICD-10-CM

## 2015-08-23 DIAGNOSIS — G8191 Hemiplegia, unspecified affecting right dominant side: Secondary | ICD-10-CM | POA: Diagnosis present

## 2015-08-23 DIAGNOSIS — Z8249 Family history of ischemic heart disease and other diseases of the circulatory system: Secondary | ICD-10-CM | POA: Diagnosis not present

## 2015-08-23 DIAGNOSIS — I63132 Cerebral infarction due to embolism of left carotid artery: Secondary | ICD-10-CM

## 2015-08-23 DIAGNOSIS — R531 Weakness: Secondary | ICD-10-CM | POA: Diagnosis present

## 2015-08-23 DIAGNOSIS — J45909 Unspecified asthma, uncomplicated: Secondary | ICD-10-CM | POA: Diagnosis present

## 2015-08-23 DIAGNOSIS — Z8673 Personal history of transient ischemic attack (TIA), and cerebral infarction without residual deficits: Secondary | ICD-10-CM | POA: Diagnosis present

## 2015-08-23 DIAGNOSIS — Z7982 Long term (current) use of aspirin: Secondary | ICD-10-CM | POA: Diagnosis not present

## 2015-08-23 DIAGNOSIS — I639 Cerebral infarction, unspecified: Secondary | ICD-10-CM | POA: Diagnosis present

## 2015-08-23 DIAGNOSIS — G43009 Migraine without aura, not intractable, without status migrainosus: Secondary | ICD-10-CM | POA: Diagnosis not present

## 2015-08-23 DIAGNOSIS — Z823 Family history of stroke: Secondary | ICD-10-CM | POA: Diagnosis not present

## 2015-08-23 DIAGNOSIS — G43909 Migraine, unspecified, not intractable, without status migrainosus: Secondary | ICD-10-CM | POA: Diagnosis present

## 2015-08-23 LAB — CBC
HEMATOCRIT: 33.4 % — AB (ref 35.0–47.0)
HEMOGLOBIN: 11.2 g/dL — AB (ref 12.0–16.0)
MCH: 28.5 pg (ref 26.0–34.0)
MCHC: 33.4 g/dL (ref 32.0–36.0)
MCV: 85.4 fL (ref 80.0–100.0)
Platelets: 209 10*3/uL (ref 150–440)
RBC: 3.92 MIL/uL (ref 3.80–5.20)
RDW: 14.6 % — ABNORMAL HIGH (ref 11.5–14.5)
WBC: 6 10*3/uL (ref 3.6–11.0)

## 2015-08-23 LAB — COMPREHENSIVE METABOLIC PANEL
ALBUMIN: 3.2 g/dL — AB (ref 3.5–5.0)
ALK PHOS: 55 U/L (ref 38–126)
ALT: 33 U/L (ref 14–54)
ANION GAP: 4 — AB (ref 5–15)
AST: 29 U/L (ref 15–41)
BUN: 14 mg/dL (ref 6–20)
CHLORIDE: 114 mmol/L — AB (ref 101–111)
CO2: 24 mmol/L (ref 22–32)
Calcium: 8.4 mg/dL — ABNORMAL LOW (ref 8.9–10.3)
Creatinine, Ser: 0.66 mg/dL (ref 0.44–1.00)
GFR calc non Af Amer: >60 mL/min (ref >60)
GLUCOSE: 115 mg/dL — AB (ref 65–99)
POTASSIUM: 3.5 mmol/L (ref 3.5–5.1)
SODIUM: 142 mmol/L (ref 135–145)
Total Bilirubin: 0.2 mg/dL — ABNORMAL LOW (ref 0.3–1.2)
Total Protein: 5.6 g/dL — ABNORMAL LOW (ref 6.5–8.1)

## 2015-08-23 LAB — ANTITHROMBIN III: AntiThromb III Func: 93 % (ref 75–120)

## 2015-08-23 MED ORDER — ATORVASTATIN CALCIUM 20 MG PO TABS
40.0000 mg | ORAL_TABLET | Freq: Every day | ORAL | Status: DC
  Administered 2015-08-23 – 2015-08-24 (×2): 40 mg via ORAL
  Filled 2015-08-23 (×2): qty 2

## 2015-08-23 MED ORDER — SODIUM CHLORIDE 0.9 % IV BOLUS (SEPSIS)
1000.0000 mL | Freq: Once | INTRAVENOUS | Status: AC
  Administered 2015-08-23: 01:00:00 1000 mL via INTRAVENOUS

## 2015-08-23 NOTE — Plan of Care (Signed)
Problem: Skin Integrity: Goal: Risk for impaired skin integrity will decrease Outcome: Progressing NIH (0). No complaints of pain. Independent with adl's. Neuro checks q2 hrs until 6am then q4. MRI today along with echocardiogram. CT angiogram performed yesterday was negative.

## 2015-08-23 NOTE — Progress Notes (Signed)
[facility] [place] [facility]   PATIENT NAME: Tammy Sosa    MR#:  [id number]  DATE OF BIRTH:  Mar 16, 1971  SUBJECTIVE:  Came in with left-sided headache left upper and lower extremity weakness Symptoms resolved. Doing well. Requesting to go home.  REVIEW OF SYSTEMS:   Review of Systems  Constitutional: Negative for fever, chills and weight loss.  HENT: Negative for ear discharge, ear pain and nosebleeds.   Eyes: Negative for blurred vision, pain and discharge.  Respiratory: Negative for sputum production, shortness of breath, wheezing and stridor.   Cardiovascular: Negative for chest pain, palpitations, orthopnea and PND.  Gastrointestinal: Negative for nausea, vomiting, abdominal pain and diarrhea.  Genitourinary: Negative for urgency and frequency.  Musculoskeletal: Negative for back pain and joint pain.  Neurological: Positive for focal weakness, weakness and headaches. Negative for sensory change and speech change.  Psychiatric/Behavioral: Negative for depression and hallucinations. The patient is not nervous/anxious.   All other systems reviewed and are negative.  Tolerating Diet: Yes Tolerating PT: Pending  DRUG ALLERGIES:  No Known Allergies  VITALS:  Blood pressure 109/58, pulse 63, temperature 98.3 F (36.8 C), temperature source Oral, resp. rate 18, height  (1.651 m), weight 84.732 kg (186 lb 12.8 oz), last menstrual period 08/20/2015, SpO2 99 %.  PHYSICAL EXAMINATION:   Physical Exam  GENERAL:  45 y.o.-year-old patient lying in the bed with no acute distress.  EYES: Pupils equal, round, reactive to light and accommodation. No scleral icterus. Extraocular muscles intact.  HEENT: Head atraumatic, normocephalic. Oropharynx and nasopharynx clear.  NECK:  Supple, no jugular venous distention. No thyroid enlargement, no tenderness.  LUNGS: Normal breath sounds bilaterally, no wheezing, rales, rhonchi. No use of accessory muscles of  respiration.  CARDIOVASCULAR: S1, S2 normal. No murmurs, rubs, or gallops.  ABDOMEN: Soft, nontender, nondistended. Bowel sounds present. No organomegaly or mass.  EXTREMITIES: No cyanosis, clubbing or edema b/l.    NEUROLOGIC: Cranial nerves II through XII are intact. No focal Motor or sensory deficits b/l.  No focal neuro deficit PSYCHIATRIC:  patient is alert and oriented x 3.  SKIN: No obvious rash, lesion, or ulcer.   LABORATORY PANEL:  CBC  Recent Labs Lab 08/23/15 0536  WBC 6.0  HGB 11.2*  HCT 33.4*  PLT 209    Chemistries   Recent Labs Lab 08/23/15 0536  NA 142  K 3.5  CL 114*  CO2 24  GLUCOSE 115*  BUN 14  CREATININE 0.66  CALCIUM 8.4*  AST 29  ALT 33  ALKPHOS 55  BILITOT 0.2*   Cardiac Enzymes  Recent Labs Lab 08/22/15 1338  TROPONINI <0.03   RADIOLOGY:  Ct Angio Head W/cm &/or Wo Cm  08/22/2015  CLINICAL DATA:  45 year old female with right-sided weakness. Headache. History migraines during pregnancy. Subsequent encounter. EXAM: CT ANGIOGRAPHY HEAD TECHNIQUE: Multidetector CT imaging of the head was performed using the standard protocol during bolus administration of intravenous contrast. Multiplanar CT image reconstructions and MIPs were obtained to evaluate the vascular anatomy. CONTRAST:  75mL OMNIPAQUE IOHEXOL 350 MG/ML SOLN COMPARISON:  08/22/2015 head CT. FINDINGS: CT HEAD Brain: No intracranial hemorrhage, CT evidence of large acute infarct or intracranial enhancing lesion. No hydrocephalus. Calvarium and skull base: Negative. Paranasal sinuses: Clear. Orbits: Negative. CTA HEAD Anterior circulation: Anterior circulation without medium or large size vessel significant stenosis or occlusion. Posterior circulation: Posterior circulation without medium or large size vessel significant stenosis or occlusion. Venous sinuses: Patent. Anatomic variants: Negative. Delayed  phase:Negative. IMPRESSION: No medium or large size vessel significant stenosis or  occlusion. No aneurysm detected. No CT evidence of large acute infarct, intracranial hemorrhage, intracranial mass or hydrocephalus. Electronically Signed   By: Lacy Duverney M.D.   On: 08/22/2015 17:31   Ct Head Wo Contrast  08/22/2015  CLINICAL DATA:  Right-sided weakness EXAM: CT HEAD WITHOUT CONTRAST TECHNIQUE: Contiguous axial images were obtained from the base of the skull through the vertex without intravenous contrast. COMPARISON:  01/19/2010 FINDINGS: The bony calvarium is intact. The ventricles are of normal size and configuration. No findings to suggest acute hemorrhage, acute infarction or space-occupying mass lesion are noted. IMPRESSION: No acute intracranial abnormality noted. These results were called by telephone at the time of interpretation on 08/22/2015 at 1:58 pm to Dr. Toney Rakes , who verbally acknowledged these results. Electronically Signed   By: Alcide Clever M.D.   On: 08/22/2015 13:59   Mr Brain Wo Contrast  08/23/2015  CLINICAL DATA:  45 year old female who with migraines with migraines presenting with acute right-sided weakness. Weakness has resolved but continued headache. Subsequent encounter. EXAM: MRI HEAD WITHOUT CONTRAST TECHNIQUE: Multiplanar, multiecho pulse sequences of the brain and surrounding structures were obtained without intravenous contrast. COMPARISON:  08/22/2015 head CT/CT angiogram. Portions of 01/19/2010 MR. FINDINGS: Very small acute nonhemorrhagic infarct left subinsular region and left periatrial region. Tiny acute nonhemorrhagic infarcts posterior left operculum region. Left middle cerebral artery branch is well visualized within the left sylvian fissure on FLAIR sequence suggesting slow flow (from proximal stenosis) or occlusion. On the prior CT angiogram, no significant stenosis of the left carotid terminus, M1 segment of the left middle cerebral artery or left middle cerebral artery bifurcation was noted. One left middle cerebral artery M2 branch (1 cm beyond its  origin) may have a proximal stenosis although difficult to confirm with certainty on CT angiogram as there is artifact at this level. No intracranial hemorrhage. No hydrocephalus. No intracranial mass lesion noted on this unenhanced exam. Minimal polypoid opacification inferior left maxillary sinus. Minimal mucosal thickening right maxillary sinus and ethmoid sinus air cells. Decreased signal intensity of bone marrow may be related to patient's habitus. Correlation with CBC to exclude anemia contributing to this appearance may be considered Partially empty non expanded sella. Orbital structures unremarkable. Cervical medullary junction fine-needle region within normal limits. IMPRESSION: Very small acute nonhemorrhagic infarct left subinsular region and left periatrial region. Tiny acute nonhemorrhagic infarcts posterior left operculum region. Left middle cerebral artery branch is well visualized within the left sylvian fissure on FLAIR sequence suggesting slow flow (from proximal stenosis) or occlusion. On the prior CT angiogram, no significant stenosis of the left carotid terminus, M1 segment of the left middle cerebral artery or left middle cerebral artery bifurcation was noted. One left middle cerebral artery M2 branch (1 cm beyond its origin) may have a proximal stenosis although difficult to confirm with certainty on CT angiogram as there is artifact at this level. Decreased signal intensity of bone marrow may be related to patient's habitus. Correlation with CBC to exclude anemia contributing to this appearance may be considered Electronically Signed   By: Lacy Duverney M.D.   On: 08/23/2015 10:01   Dg Chest Portable 1 View  08/22/2015  CLINICAL DATA:  Possible stroke. EXAM: PORTABLE CHEST 1 VIEW COMPARISON:  None. FINDINGS: Cardiomediastinal silhouette is normal. Mediastinal contours appear intact. There is no evidence of focal airspace consolidation, pleural effusion or pneumothorax. Osseous structures  are without acute abnormality. Soft tissues are grossly  normal. IMPRESSION: No active disease. Electronically Signed   By: Ted Mcalpine M.D.   On: 08/22/2015 15:44   ASSESSMENT AND PLAN:  Patient is a 45 year old Caucasian female with past medical history of intermittent migraine headaches comes in with right upper and lower extremity weakness and severe headache. She was found to have  1. 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 Emmaline Life neuro hospitalist. -Chek echo. Get ultrasound carotid Doppler -Case discussed with Dr. Welton Flakes cardiology to perform TEE as part of workup for patient's with young stroke  2. Headache resolved likely secondary to stroke  3. Birth control pills discontinued at present patient is on norethindrone 0.35 mg daily  Case discussed with Care Management/Social Worker. Management plans discussed with the patient, family and they are in agreement.  CODE STATUS: Full  DVT Prophylaxis: Lovenox  TOTAL TIME TAKING CARE OF THIS PATIENT: 30 minutes.  >50% time spent on counselling and coordination of care  POSSIBLE D/C IN one to 2 DAYS, DEPENDING ON CLINICAL CONDITION.  Note: This dictation was prepared with Dragon dictation along with smaller phrase technology. Any transcriptional errors that result from this process are unintentional.  Kamariah Fruchter M.D on 08/23/2015 at 12:28 PM  Between 7am to 6pm - Pager - [phone]  After 6pm go to [email or web address] - password [email or web address]  [facility]  Office  [phone]  CC: Primary care physician; Towana Badger, MD

## 2015-08-23 NOTE — Consult Note (Signed)
Referring Physician: Manuella Ghazi    Chief Complaint: HA with right sided weakness  HPI: Tammy Sosa is an 45 y.o. female who reports that she was at home with family.  She bent down to pick something up and could not get up on her own.  She experienced a severe headache.  When her family came to help her she could not get her words out and they noticed a right facial droop.  Patient was brought to the ED where her symptoms resolved.    Date last known well: 08/22/2015 Time last known well: Time: 12:30 tPA Given: No: Resolution of symptoms  Past Medical History  Diagnosis Date  . Asthma     Past Surgical History  Procedure Laterality Date  . Knee surgery    . Knee surgery      Family history: Positive for CVA and HTN.    Social History:  reports that she has never smoked. She does not have any smokeless tobacco history on file. She reports that she does not drink alcohol. Her drug history is not on file.  Allergies: No Known Allergies  Medications:  I have reviewed the patient's current medications. Prior to Admission:  Prescriptions prior to admission  Medication Sig Dispense Refill Last Dose  . norethindrone (MICRONOR,CAMILA,ERRIN) 0.35 MG tablet Take 1 tablet by mouth daily.  11 08/22/2015 at Unknown time  . diclofenac sodium (VOLTAREN) 1 % GEL Apply 2 g topically 4 (four) times daily. Rub into affected area of foot 2 to 4 times daily 100 g 2    Scheduled: . aspirin EC  81 mg Oral Daily  . atorvastatin  40 mg Oral q1800  . docusate sodium  100 mg Oral BID  . heparin  5,000 Units Subcutaneous 3 times per day  . sodium chloride  3 mL Intravenous Q12H    ROS: History obtained from the patient  General ROS: negative for - chills, fatigue, fever, night sweats, weight gain or weight loss Psychological ROS: negative for - behavioral disorder, hallucinations, memory difficulties, mood swings or suicidal ideation Ophthalmic ROS: negative for - blurry vision, double vision, eye  pain or loss of vision ENT ROS: negative for - epistaxis, nasal discharge, oral lesions, sore throat, tinnitus or vertigo Allergy and Immunology ROS: negative for - hives or itchy/watery eyes Hematological and Lymphatic ROS: negative for - bleeding problems, bruising or swollen lymph nodes Endocrine ROS: negative for - galactorrhea, hair pattern changes, polydipsia/polyuria or temperature intolerance Respiratory ROS: negative for - cough, hemoptysis, shortness of breath or wheezing Cardiovascular ROS: negative for - chest pain, dyspnea on exertion, edema or irregular heartbeat Gastrointestinal ROS: negative for - abdominal pain, diarrhea, hematemesis, nausea/vomiting or stool incontinence Genito-Urinary ROS: negative for - dysuria, hematuria, incontinence or urinary frequency/urgency Musculoskeletal ROS: negative for - joint swelling or muscular weakness Neurological ROS: as noted in HPI Dermatological ROS: negative for rash and skin lesion changes  Physical Examination: Blood pressure 107/48, pulse 62, temperature 98.4 F (36.9 C), temperature source Oral, resp. rate 18, height 5' 5" (1.651 m), weight 84.732 kg (186 lb 12.8 oz), last menstrual period 08/20/2015, SpO2 98 %.  HEENT-  Normocephalic, no lesions, without obvious abnormality.  Normal external eye and conjunctiva.  Normal TM's bilaterally.  Normal auditory canals and external ears. Normal external nose, mucus membranes and septum.  Normal pharynx. Cardiovascular- S1, S2 normal, pulses palpable throughout   Lungs- chest clear, no wheezing, rales, normal symmetric air entry Abdomen- soft, non-tender; bowel sounds normal; no masses,  no organomegaly Extremities- no edema Lymph-no adenopathy palpable Musculoskeletal-no joint tenderness, deformity or swelling Skin-warm and dry, no hyperpigmentation, vitiligo, or suspicious lesions  Neurological Examination Mental Status: Alert, oriented, thought content appropriate.  Speech fluent  without evidence of aphasia.  Able to follow 3 step commands without difficulty. Cranial Nerves: II: Discs flat bilaterally; Visual fields grossly normal, pupils equal, round, reactive to light and accommodation III,IV, VI: ptosis not present, extra-ocular motions intact bilaterally V,VII: smile symmetric, facial light touch sensation normal bilaterally VIII: hearing normal bilaterally IX,X: gag reflex present XI: bilateral shoulder shrug XII: midline tongue extension Motor: Right : Upper extremity   5/5    Left:     Upper extremity   5/5  Lower extremity   5/5     Lower extremity   5/5 Mildly decreased right hand grip Sensory: Pinprick and light touch intact throughout, bilaterally Deep Tendon Reflexes: 2+ and symmetric throughout Plantars: Right: downgoing   Left: downgoing Cerebellar: Normal finger-to-noseand normal heel-to-shin testing bilaterally.  Decreased rapid alternating movements using the RUE  Gait: normal gait and station   Laboratory Studies:  Basic Metabolic Panel:  Recent Labs Lab 08/22/15 1338 08/23/15 0536  NA 138 142  K 3.7 3.5  CL 108 114*  CO2 24 24  GLUCOSE 95 115*  BUN 11 14  CREATININE 0.79 0.66  CALCIUM 9.0 8.4*    Liver Function Tests:  Recent Labs Lab 08/22/15 1338 08/23/15 0536  AST 25 29  ALT 20 33  ALKPHOS 49 55  BILITOT 0.5 0.2*  PROT 7.2 5.6*  ALBUMIN 4.3 3.2*   No results for input(s): LIPASE, AMYLASE in the last 168 hours. No results for input(s): AMMONIA in the last 168 hours.  CBC:  Recent Labs Lab 08/22/15 1338 08/23/15 0536  WBC 6.7 6.0  NEUTROABS 3.5  --   HGB 12.5 11.2*  HCT 36.8 33.4*  MCV 84.9 85.4  PLT 252 209    Cardiac Enzymes:  Recent Labs Lab 08/22/15 1338  TROPONINI <0.03    BNP: Invalid input(s): POCBNP  CBG: No results for input(s): GLUCAP in the last 168 hours.  Microbiology: No results found for this or any previous visit.  Coagulation Studies:  Recent Labs  08/22/15 1338   LABPROT 13.7  INR 1.03    Urinalysis:  Recent Labs Lab 08/22/15 1438  COLORURINE YELLOW*  YELLOW*  LABSPEC 1.006  1.006  PHURINE 6.0  6.0  GLUCOSEU NEGATIVE  NEGATIVE  HGBUR 1+*  1+*  BILIRUBINUR NEGATIVE  NEGATIVE  KETONESUR TRACE*  TRACE*  PROTEINUR NEGATIVE  NEGATIVE  NITRITE NEGATIVE  NEGATIVE  LEUKOCYTESUR TRACE*  TRACE*    Lipid Panel:    Component Value Date/Time   CHOL 158 08/22/2015 1848   TRIG 48 08/22/2015 1848   HDL 40* 08/22/2015 1848   CHOLHDL 4.0 08/22/2015 1848   VLDL 10 08/22/2015 1848   LDLCALC 108* 08/22/2015 1848    HgbA1C: No results found for: HGBA1C  Urine Drug Screen:  No results found for: LABOPIA, COCAINSCRNUR, LABBENZ, AMPHETMU, THCU, LABBARB  Alcohol Level:  Recent Labs Lab 08/22/15 1338  ETH <5    Imaging: Ct Angio Head W/cm &/or Wo Cm  08/22/2015  CLINICAL DATA:  44-year-old female with right-sided weakness. Headache. History migraines during pregnancy. Subsequent encounter. EXAM: CT ANGIOGRAPHY HEAD TECHNIQUE: Multidetector CT imaging of the head was performed using the standard protocol during bolus administration of intravenous contrast. Multiplanar CT image reconstructions and MIPs were obtained to evaluate the vascular anatomy. CONTRAST:    75mL OMNIPAQUE IOHEXOL 350 MG/ML SOLN COMPARISON:  08/22/2015 head CT. FINDINGS: CT HEAD Brain: No intracranial hemorrhage, CT evidence of large acute infarct or intracranial enhancing lesion. No hydrocephalus. Calvarium and skull base: Negative. Paranasal sinuses: Clear. Orbits: Negative. CTA HEAD Anterior circulation: Anterior circulation without medium or large size vessel significant stenosis or occlusion. Posterior circulation: Posterior circulation without medium or large size vessel significant stenosis or occlusion. Venous sinuses: Patent. Anatomic variants: Negative. Delayed phase:Negative. IMPRESSION: No medium or large size vessel significant stenosis or occlusion. No aneurysm  detected. No CT evidence of large acute infarct, intracranial hemorrhage, intracranial mass or hydrocephalus. Electronically Signed   By: Steven  Olson M.D.   On: 08/22/2015 17:31   Ct Head Wo Contrast  08/22/2015  CLINICAL DATA:  Right-sided weakness EXAM: CT HEAD WITHOUT CONTRAST TECHNIQUE: Contiguous axial images were obtained from the base of the skull through the vertex without intravenous contrast. COMPARISON:  01/19/2010 FINDINGS: The bony calvarium is intact. The ventricles are of normal size and configuration. No findings to suggest acute hemorrhage, acute infarction or space-occupying mass lesion are noted. IMPRESSION: No acute intracranial abnormality noted. These results were called by telephone at the time of interpretation on 08/22/2015 at 1:58 pm to Dr. ERYKA GAYLE , who verbally acknowledged these results. Electronically Signed   By: Mark  Lukens M.D.   On: 08/22/2015 13:59   Mr Brain Wo Contrast  08/23/2015  CLINICAL DATA:  44-year-old female who with migraines presenting with acute right-sided weakness. Weakness has resolved but continued headache. Subsequent encounter. EXAM: MRI HEAD WITHOUT CONTRAST TECHNIQUE: Multiplanar, multiecho pulse sequences of the brain and surrounding structures were obtained without intravenous contrast. COMPARISON:  08/22/2015 head CT/CT angiogram. Portions of 01/19/2010 MR. FINDINGS: Very small acute nonhemorrhagic infarct left subinsular region and left periatrial region. Tiny acute nonhemorrhagic infarcts posterior left operculum region. Left middle cerebral artery branch is well visualized within the left sylvian fissure on FLAIR sequence suggesting slow flow (from proximal stenosis) or occlusion. On the prior CT angiogram, no significant stenosis of the left carotid terminus, M1 segment of the left middle cerebral artery or left middle cerebral artery bifurcation was noted. One left middle cerebral artery M2 branch (1 cm beyond its origin) may have a proximal  stenosis although difficult to confirm with certainty on CT angiogram as there is artifact at this level. No intracranial hemorrhage. No hydrocephalus. No intracranial mass lesion noted on this unenhanced exam. Minimal polypoid opacification inferior left maxillary sinus. Minimal mucosal thickening right maxillary sinus and ethmoid sinus air cells. Decreased signal intensity of bone marrow may be related to patient's habitus. Correlation with CBC to exclude anemia contributing to this appearance may be considered Partially empty non expanded sella. Orbital structures unremarkable. Cervical medullary junction fine-needle region within normal limits. IMPRESSION: Very small acute nonhemorrhagic infarct left subinsular region and left periatrial region. Tiny acute nonhemorrhagic infarcts posterior left operculum region. Left middle cerebral artery branch is well visualized within the left sylvian fissure on FLAIR sequence suggesting slow flow (from proximal stenosis) or occlusion. On the prior CT angiogram, no significant stenosis of the left carotid terminus, M1 segment of the left middle cerebral artery or left middle cerebral artery bifurcation was noted. One left middle cerebral artery M2 branch (1 cm beyond its origin) may have a proximal stenosis although difficult to confirm with certainty on CT angiogram as there is artifact at this level. Decreased signal intensity of bone marrow may be related to patient's habitus. Correlation with CBC to exclude   anemia contributing to this appearance may be considered Electronically Signed   By: Steven  Olson M.D.   On: 08/23/2015 10:01   Us Carotid Bilateral  08/23/2015  CLINICAL DATA:  Acute left-sided cerebral infarction. EXAM: BILATERAL CAROTID DUPLEX ULTRASOUND TECHNIQUE: Gray scale imaging, color Doppler and duplex ultrasound were performed of bilateral carotid and vertebral arteries in the neck. COMPARISON:  None. FINDINGS: Criteria: Quantification of carotid  stenosis is based on velocity parameters that correlate the residual internal carotid diameter with NASCET-based stenosis levels, using the diameter of the distal internal carotid lumen as the denominator for stenosis measurement. The following velocity measurements were obtained: RIGHT ICA:  89/32 cm/sec CCA:  100/27 cm/sec SYSTOLIC ICA/CCA RATIO:  0.9 DIASTOLIC ICA/CCA RATIO:  1.2 ECA:  120 cm/sec LEFT ICA:  77/30 cm/sec CCA:  109/22 cm/sec SYSTOLIC ICA/CCA RATIO:  0.7 DIASTOLIC ICA/CCA RATIO:  1.4 ECA:  109 cm/sec RIGHT CAROTID ARTERY: No focal plaque is identified. Velocities and waveforms are normal. There is no evidence of right carotid stenosis. RIGHT VERTEBRAL ARTERY: Antegrade flow with normal waveform and velocity. LEFT CAROTID ARTERY: No focal plaque is identified. Velocities and waveforms are normal. There is no evidence of left carotid stenosis. LEFT VERTEBRAL ARTERY: Antegrade flow with normal waveform and velocity. IMPRESSION: Normal carotid duplex ultrasound demonstrating no evidence of focal plaque or bilateral carotid stenosis in the neck. Both vertebral arteries demonstrate antegrade flow. Electronically Signed   By: Glenn  Yamagata M.D.   On: 08/23/2015 13:28   Dg Chest Portable 1 View  08/22/2015  CLINICAL DATA:  Possible stroke. EXAM: PORTABLE CHEST 1 VIEW COMPARISON:  None. FINDINGS: Cardiomediastinal silhouette is normal. Mediastinal contours appear intact. There is no evidence of focal airspace consolidation, pleural effusion or pneumothorax. Osseous structures are without acute abnormality. Soft tissues are grossly normal. IMPRESSION: No active disease. Electronically Signed   By: Dobrinka  Dimitrova M.D.   On: 08/22/2015 15:44    Assessment: 44 y.o. female presenting with headache, right sided weakness and difficulty with speech.  Although symptoms resolved, MRI of the brain performed.  MRI personally reviewed and shows a small left periatrial and subinsular acute infarcts.  CTA  shows no vessel occlusion.  LDL elevated at 108.  ESR 15.  Carotid dopplers are unremarkable.  Etiology for infarcts remains unclear.  Further work up recommended.  Stroke Risk Factors - hyperlipidemia  Plan: 1. HgbA1c, ATIII, lupus anticoagulant, factor V, homocysteine, ANA, anticardiolipin antibody, protein C, protein S 2. PT consult, OT consult, Speech consult 3. Echocardiogram.  If unremarkable would have a low threshold for a TEE 4. Prophylactic therapy-Antiplatelet med: Aspirin - dose 325mg daily 5. NPO until RN stroke swallow screen 6. Telemetry monitoring 7. Frequent neuro checks 8. Agree with statin initiation 9. BCP discontinuation   Leslie Reynolds, MD Neurology [phone] 08/23/2015, 8:02 PM      

## 2015-08-23 NOTE — Plan of Care (Signed)
Problem: Physical Regulation: Goal: Ability to maintain clinical measurements within normal limits will improve Outcome: Progressing VSS. Mild headache in am but declined medication. NIH score 0. No neuro deficits. MRI positive for a stroke. TEE tomorrow in am. NPO after midnight.

## 2015-08-23 NOTE — Progress Notes (Signed)
Tammy Sosa is a 45 y.o. female  [id number]  Primary Cardiologist: Adrian Blackwater Reason for Consultation: CVA  HPI: 45 year old pleasant white female with a past medical history ofsignificant medical problems presented to the emergency room with right-sided weakness and severe headache and was found to have a nonhemorrhagic infarct on MRI and I was asked to evaluate the patient for TEE. Patient patient is otherwise feeling fine right now.   Review of Systems: No chest pain shortness of breath or difficulty swallowing   Past Medical History  Diagnosis Date  . Asthma     Medications Prior to Admission  Medication Sig Dispense Refill  . norethindrone (MICRONOR,CAMILA,ERRIN) 0.35 MG tablet Take 1 tablet by mouth daily.  11  . diclofenac sodium (VOLTAREN) 1 % GEL Apply 2 g topically 4 (four) times daily. Rub into affected area of foot 2 to 4 times daily 100 g 2     . aspirin EC  81 mg Oral Daily  . atorvastatin  40 mg Oral q1800  . docusate sodium  100 mg Oral BID  . heparin  5,000 Units Subcutaneous 3 times per day  . sodium chloride  3 mL Intravenous Q12H    Infusions:    No Known Allergies  Social History   Social History  . Marital Status: Married    Spouse Name: N/A  . Number of Children: N/A  . Years of Education: N/A   Occupational History  . Not on file.   Social History Main Topics  . Smoking status: Never Smoker   . Smokeless tobacco: Not on file  . Alcohol Use: No  . Drug Use: Not on file  . Sexual Activity: Not on file   Other Topics Concern  . Not on file   Social History Narrative    History reviewed. No pertinent family history.  PHYSICAL EXAM: Filed Vitals:   08/23/15 0610 08/23/15 1100  BP: 102/54 109/58  Pulse: 51 63  Temp: 98.5 F (36.9 C) 98.3 F (36.8 C)  Resp: 17 18     Intake/Output Summary (Last 24 hours) at 08/23/15 1227 Last data filed at 08/23/15 0006  Gross per 24 hour  Intake      0 ml  Output    350 ml  Net    -350 ml    General:  Well appearing. No respiratory difficulty HEENT: normal Neck: supple. no JVD. Carotids 2+ bilat; no bruits. No lymphadenopathy or thryomegaly appreciated. Cor: PMI nondisplaced. Regular rate & rhythm. No rubs, gallops or murmurs. Lungs: clear Abdomen: soft, nontender, nondistended. No hepatosplenomegaly. No bruits or masses. Good bowel sounds. Extremities: no cyanosis, clubbing, rash, edema Neuro: alert & oriented x 3, cranial nerves grossly intact. moves all 4 extremities w/o difficulty. Affect pleasant.  ECG: Sinus rhythm with no acute changes  Results for orders placed or performed during the hospital encounter of 08/22/15 (from the past 24 hour(s))  Ethanol     Status: None   Collection Time: 08/22/15  1:38 PM  Result Value Ref Range   Alcohol, Ethyl (B) <5 <5 mg/dL  Protime-INR     Status: None   Collection Time: 08/22/15  1:38 PM  Result Value Ref Range   Prothrombin Time 13.7 11.4 - 15.0 seconds   INR 1.03   APTT     Status: None   Collection Time: 08/22/15  1:38 PM  Result Value Ref Range   aPTT 36 24 - 36 seconds  CBC     Status:  None   Collection Time: 08/22/15  1:38 PM  Result Value Ref Range   WBC 6.7 3.6 - 11.0 K/uL   RBC 4.34 3.80 - 5.20 MIL/uL   Hemoglobin 12.5 12.0 - 16.0 g/dL   HCT 16.1 09.6 - 04.5 %   MCV 84.9 80.0 - 100.0 fL   MCH 28.8 26.0 - 34.0 pg   MCHC 33.9 32.0 - 36.0 g/dL   RDW 40.9 81.1 - 91.4 %   Platelets 252 150 - 440 K/uL  Differential     Status: None   Collection Time: 08/22/15  1:38 PM  Result Value Ref Range   Neutrophils Relative % 52 %   Neutro Abs 3.5 1.4 - 6.5 K/uL   Lymphocytes Relative 38 %   Lymphs Abs 2.5 1.0 - 3.6 K/uL   Monocytes Relative 6 %   Monocytes Absolute 0.4 0.2 - 0.9 K/uL   Eosinophils Relative 2 %   Eosinophils Absolute 0.1 0 - 0.7 K/uL   Basophils Relative 2 %   Basophils Absolute 0.1 0 - 0.1 K/uL  Comprehensive metabolic panel     Status: None   Collection Time: 08/22/15  1:38 PM   Result Value Ref Range   Sodium 138 135 - 145 mmol/L   Potassium 3.7 3.5 - 5.1 mmol/L   Chloride 108 101 - 111 mmol/L   CO2 24 22 - 32 mmol/L   Glucose, Bld 95 65 - 99 mg/dL   BUN 11 6 - 20 mg/dL   Creatinine, Ser 7.82 0.44 - 1.00 mg/dL   Calcium 9.0 8.9 - 95.6 mg/dL   Total Protein 7.2 6.5 - 8.1 g/dL   Albumin 4.3 3.5 - 5.0 g/dL   AST 25 15 - 41 U/L   ALT 20 14 - 54 U/L   Alkaline Phosphatase 49 38 - 126 U/L   Total Bilirubin 0.5 0.3 - 1.2 mg/dL   GFR calc non Af Amer >60 >60 mL/min   GFR calc Af Amer >60 >60 mL/min   Anion gap 6 5 - 15  Troponin I     Status: None   Collection Time: 08/22/15  1:38 PM  Result Value Ref Range   Troponin I <0.03 <0.031 ng/mL  Urinalysis complete, with microscopic ([facility] only)     Status: Abnormal   Collection Time: 08/22/15  2:38 PM  Result Value Ref Range   Color, Urine YELLOW (A) YELLOW   APPearance HAZY (A) CLEAR   Glucose, UA NEGATIVE NEGATIVE mg/dL   Bilirubin Urine NEGATIVE NEGATIVE   Ketones, ur TRACE (A) NEGATIVE mg/dL   Specific Gravity, Urine 1.006 1.005 - 1.030   Hgb urine dipstick 1+ (A) NEGATIVE   pH 6.0 5.0 - 8.0   Protein, ur NEGATIVE NEGATIVE mg/dL   Nitrite NEGATIVE NEGATIVE   Leukocytes, UA TRACE (A) NEGATIVE   RBC / HPF 6-30 0 - 5 RBC/hpf   WBC, UA 6-30 0 - 5 WBC/hpf   Bacteria, UA RARE (A) NONE SEEN   Squamous Epithelial / LPF 6-30 (A) NONE SEEN   Mucous PRESENT   Urinalysis complete, with microscopic ([facility] only)     Status: Abnormal   Collection Time: 08/22/15  2:38 PM  Result Value Ref Range   Color, Urine YELLOW (A) YELLOW   APPearance HAZY (A) CLEAR   Glucose, UA NEGATIVE NEGATIVE mg/dL   Bilirubin Urine NEGATIVE NEGATIVE   Ketones, ur TRACE (A) NEGATIVE mg/dL   Specific Gravity, Urine 1.006 1.005 - 1.030   Hgb urine dipstick 1+ (  A) NEGATIVE   pH 6.0 5.0 - 8.0   Protein, ur NEGATIVE NEGATIVE mg/dL   Nitrite NEGATIVE NEGATIVE   Leukocytes, UA TRACE (A) NEGATIVE   RBC / HPF 6-30 0 - 5 RBC/hpf   WBC, UA  6-30 0 - 5 WBC/hpf   Bacteria, UA RARE (A) NONE SEEN   Squamous Epithelial / LPF 6-30 (A) NONE SEEN   Mucous PRESENT   Pregnancy, urine POC     Status: None   Collection Time: 08/22/15  2:46 PM  Result Value Ref Range   Preg Test, Ur NEGATIVE NEGATIVE  Sedimentation rate     Status: None   Collection Time: 08/22/15  6:48 PM  Result Value Ref Range   Sed Rate 15 0 - 20 mm/hr  Lipid panel     Status: Abnormal   Collection Time: 08/22/15  6:48 PM  Result Value Ref Range   Cholesterol 158 0 - 200 mg/dL   Triglycerides 48 <161 mg/dL   HDL 40 (L) >09 mg/dL   Total CHOL/HDL Ratio 4.0 RATIO   VLDL 10 0 - 40 mg/dL   LDL Cholesterol 604 (H) 0 - 99 mg/dL  Comprehensive metabolic panel     Status: Abnormal   Collection Time: 08/23/15  5:36 AM  Result Value Ref Range   Sodium 142 135 - 145 mmol/L   Potassium 3.5 3.5 - 5.1 mmol/L   Chloride 114 (H) 101 - 111 mmol/L   CO2 24 22 - 32 mmol/L   Glucose, Bld 115 (H) 65 - 99 mg/dL   BUN 14 6 - 20 mg/dL   Creatinine, Ser 5.40 0.44 - 1.00 mg/dL   Calcium 8.4 (L) 8.9 - 10.3 mg/dL   Total Protein 5.6 (L) 6.5 - 8.1 g/dL   Albumin 3.2 (L) 3.5 - 5.0 g/dL   AST 29 15 - 41 U/L   ALT 33 14 - 54 U/L   Alkaline Phosphatase 55 38 - 126 U/L   Total Bilirubin 0.2 (L) 0.3 - 1.2 mg/dL   GFR calc non Af Amer >60 >60 mL/min   GFR calc Af Amer >60 >60 mL/min   Anion gap 4 (L) 5 - 15  CBC     Status: Abnormal   Collection Time: 08/23/15  5:36 AM  Result Value Ref Range   WBC 6.0 3.6 - 11.0 K/uL   RBC 3.92 3.80 - 5.20 MIL/uL   Hemoglobin 11.2 (L) 12.0 - 16.0 g/dL   HCT 98.1 (L) 19.1 - 47.8 %   MCV 85.4 80.0 - 100.0 fL   MCH 28.5 26.0 - 34.0 pg   MCHC 33.4 32.0 - 36.0 g/dL   RDW 29.5 (H) 62.1 - 30.8 %   Platelets 209 150 - 440 K/uL   Ct Angio Head W/cm &/or Wo Cm  08/22/2015  CLINICAL DATA:  45 year old female with right-sided weakness. Headache. History migraines during pregnancy. Subsequent encounter. EXAM: CT ANGIOGRAPHY HEAD TECHNIQUE: Multidetector  CT imaging of the head was performed using the standard protocol during bolus administration of intravenous contrast. Multiplanar CT image reconstructions and MIPs were obtained to evaluate the vascular anatomy. CONTRAST:  75mL OMNIPAQUE IOHEXOL 350 MG/ML SOLN COMPARISON:  08/22/2015 head CT. FINDINGS: CT HEAD Brain: No intracranial hemorrhage, CT evidence of large acute infarct or intracranial enhancing lesion. No hydrocephalus. Calvarium and skull base: Negative. Paranasal sinuses: Clear. Orbits: Negative. CTA HEAD Anterior circulation: Anterior circulation without medium or large size vessel significant stenosis or occlusion. Posterior circulation: Posterior circulation without medium or large  size vessel significant stenosis or occlusion. Venous sinuses: Patent. Anatomic variants: Negative. Delayed phase:Negative. IMPRESSION: No medium or large size vessel significant stenosis or occlusion. No aneurysm detected. No CT evidence of large acute infarct, intracranial hemorrhage, intracranial mass or hydrocephalus. Electronically Signed   By: Lacy Duverney M.D.   On: 08/22/2015 17:31   Ct Head Wo Contrast  08/22/2015  CLINICAL DATA:  Right-sided weakness EXAM: CT HEAD WITHOUT CONTRAST TECHNIQUE: Contiguous axial images were obtained from the base of the skull through the vertex without intravenous contrast. COMPARISON:  01/19/2010 FINDINGS: The bony calvarium is intact. The ventricles are of normal size and configuration. No findings to suggest acute hemorrhage, acute infarction or space-occupying mass lesion are noted. IMPRESSION: No acute intracranial abnormality noted. These results were called by telephone at the time of interpretation on 08/22/2015 at 1:58 pm to Dr. Toney Rakes , who verbally acknowledged these results. Electronically Signed   By: Alcide Clever M.D.   On: 08/22/2015 13:59   Mr Brain Wo Contrast  08/23/2015  CLINICAL DATA:  45 year old female who with migraines presenting with acute  right-sided weakness. Weakness has resolved but continued headache. Subsequent encounter. EXAM: MRI HEAD WITHOUT CONTRAST TECHNIQUE: Multiplanar, multiecho pulse sequences of the brain and surrounding structures were obtained without intravenous contrast. COMPARISON:  08/22/2015 head CT/CT angiogram. Portions of 01/19/2010 MR. FINDINGS: Very small acute nonhemorrhagic infarct left subinsular region and left periatrial region. Tiny acute nonhemorrhagic infarcts posterior left operculum region. Left middle cerebral artery branch is well visualized within the left sylvian fissure on FLAIR sequence suggesting slow flow (from proximal stenosis) or occlusion. On the prior CT angiogram, no significant stenosis of the left carotid terminus, M1 segment of the left middle cerebral artery or left middle cerebral artery bifurcation was noted. One left middle cerebral artery M2 branch (1 cm beyond its origin) may have a proximal stenosis although difficult to confirm with certainty on CT angiogram as there is artifact at this level. No intracranial hemorrhage. No hydrocephalus. No intracranial mass lesion noted on this unenhanced exam. Minimal polypoid opacification inferior left maxillary sinus. Minimal mucosal thickening right maxillary sinus and ethmoid sinus air cells. Decreased signal intensity of bone marrow may be related to patient's habitus. Correlation with CBC to exclude anemia contributing to this appearance may be considered Partially empty non expanded sella. Orbital structures unremarkable. Cervical medullary junction fine-needle region within normal limits. IMPRESSION: Very small acute nonhemorrhagic infarct left subinsular region and left periatrial region. Tiny acute nonhemorrhagic infarcts posterior left operculum region. Left middle cerebral artery branch is well visualized within the left sylvian fissure on FLAIR sequence suggesting slow flow (from proximal stenosis) or occlusion. On the prior CT angiogram,  no significant stenosis of the left carotid terminus, M1 segment of the left middle cerebral artery or left middle cerebral artery bifurcation was noted. One left middle cerebral artery M2 branch (1 cm beyond its origin) may have a proximal stenosis although difficult to confirm with certainty on CT angiogram as there is artifact at this level. Decreased signal intensity of bone marrow may be related to patient's habitus. Correlation with CBC to exclude anemia contributing to this appearance may be considered Electronically Signed   By: Lacy Duverney M.D.   On: 08/23/2015 10:01   Dg Chest Portable 1 View  08/22/2015  CLINICAL DATA:  Possible stroke. EXAM: PORTABLE CHEST 1 VIEW COMPARISON:  None. FINDINGS: Cardiomediastinal silhouette is normal. Mediastinal contours appear intact. There is no evidence of focal airspace consolidation, pleural effusion  or pneumothorax. Osseous structures are without acute abnormality. Soft tissues are grossly normal. IMPRESSION: No active disease. Electronically Signed   By: Ted Mcalpine M.D.   On: 08/22/2015 15:44     ASSESSMENT AND PLAN: Micro-infarct seen on MRI to be nonhemorrhagic and advised to do TEE by neurologists. Dr. Burna Cash called and she is requesting T to be done tomorrow morning. Patient has no difficulty swallowing and procedure was explained to the patient and is being set up for in the morning.  Amyah Clawson A

## 2015-08-24 ENCOUNTER — Inpatient Hospital Stay: Payer: BLUE CROSS/BLUE SHIELD

## 2015-08-24 ENCOUNTER — Inpatient Hospital Stay
Admit: 2015-08-24 | Discharge: 2015-08-24 | Disposition: A | Discharge status: Home / Self Care | Payer: BLUE CROSS/BLUE SHIELD | Attending: Cardiovascular Disease | Admitting: Cardiovascular Disease

## 2015-08-24 ENCOUNTER — Encounter
Admission: EM | Disposition: A | Discharge status: Home / Self Care | Payer: Self-pay | Source: Home / Self Care | Attending: Internal Medicine

## 2015-08-24 DIAGNOSIS — G43009 Migraine without aura, not intractable, without status migrainosus: Secondary | ICD-10-CM

## 2015-08-24 LAB — HEMOGLOBIN A1C: Hgb A1c MFr Bld: 5.3 % (ref 4.0–6.0)

## 2015-08-24 SURGERY — ECHOCARDIOGRAM, TRANSESOPHAGEAL
Anesthesia: Moderate Sedation

## 2015-08-24 MED ORDER — TRAMADOL HCL 50 MG PO TABS: 50.0000 mg | ORAL_TABLET | Freq: Four times a day (QID) | ORAL | Status: DC | PRN

## 2015-08-24 MED ORDER — MIDAZOLAM HCL 2 MG/2ML IJ SOLN
INTRAMUSCULAR | Status: AC | PRN
  Administered 2015-08-24: 13:00:00 1 mg via INTRAVENOUS

## 2015-08-24 MED ORDER — FENTANYL CITRATE (PF) 100 MCG/2ML IJ SOLN
INTRAMUSCULAR | Status: AC | PRN
  Administered 2015-08-24 (×2): 50 ug via INTRAVENOUS

## 2015-08-24 MED ORDER — SODIUM CHLORIDE 0.9 % IV SOLN
INTRAVENOUS | Status: AC | PRN
  Administered 2015-08-24: 10 mL/h via INTRAVENOUS

## 2015-08-24 MED ORDER — FENTANYL CITRATE (PF) 100 MCG/2ML IJ SOLN
INTRAMUSCULAR | Status: AC
  Administered 2015-08-24: 19:00:00
  Filled 2015-08-24: qty 4

## 2015-08-24 MED ORDER — SODIUM CHLORIDE 0.9 % IV SOLN
INTRAVENOUS | Status: DC
  Administered 2015-08-24 (×2): via INTRAVENOUS

## 2015-08-24 MED ORDER — LIDOCAINE VISCOUS 2 % MT SOLN
OROMUCOSAL | Status: AC
  Administered 2015-08-24: 19:00:00
  Filled 2015-08-24: qty 15

## 2015-08-24 MED ORDER — MIDAZOLAM HCL 5 MG/5ML IJ SOLN
INTRAMUSCULAR | Status: AC
  Administered 2015-08-24: 19:00:00
  Filled 2015-08-24: qty 5

## 2015-08-24 MED ORDER — ASPIRIN EC 325 MG PO TBEC
325.0000 mg | DELAYED_RELEASE_TABLET | Freq: Every day | ORAL | Status: DC
  Administered 2015-08-24 – 2015-08-25 (×2): 325 mg via ORAL
  Filled 2015-08-24 (×2): qty 1

## 2015-08-24 MED ORDER — MIDAZOLAM HCL 5 MG/5ML IJ SOLN
INTRAMUSCULAR | Status: AC | PRN
  Administered 2015-08-24: 1 mg via INTRAVENOUS

## 2015-08-24 MED ORDER — BUTAMBEN-TETRACAINE-BENZOCAINE 2-2-14 % EX AERO
INHALATION_SPRAY | CUTANEOUS | Status: AC
  Administered 2015-08-24: 19:00:00
  Filled 2015-08-24: qty 20

## 2015-08-24 NOTE — Progress Notes (Signed)
*  PRELIMINARY RESULTS* Echocardiogram 2D Echocardiogram has been performed.  Tammy Sosa 08/24/2015, 12:56 PM

## 2015-08-24 NOTE — Progress Notes (Signed)
Regular diet per Dr Sherryll Burger

## 2015-08-24 NOTE — Progress Notes (Signed)
SUBJECTIVE: Patient feeling much better   Filed Vitals:   08/23/15 2150 08/24/15 0114 08/24/15 0500 08/24/15 0511  BP: 104/52 99/47  107/56  Pulse: 66 57  62  Temp: 97.9 F (36.6 C) 97.4 F (36.3 C)  98 F (36.7 C)  TempSrc: Oral Oral  Oral  Resp: Height:      Weight:   187 lb 14.4 oz (85.231 kg)   SpO2: 100% 98%  100%    Intake/Output Summary (Last 24 hours) at 08/24/15 0827 Last data filed at 08/23/15 1800  Gross per 24 hour  Intake      0 ml  Output      0 ml  Net      0 ml    LABS: Basic Metabolic Panel:  Recent Labs  04/54/09 1338 08/23/15 0536  NA 138 142  K 3.7 3.5  CL 108 114*  CO2 24 24  GLUCOSE 95 115*  BUN 11 14  CREATININE 0.79 0.66  CALCIUM 9.0 8.4*   Liver Function Tests:  Recent Labs  08/22/15 1338 08/23/15 0536  AST 25 29  ALT 20 33  ALKPHOS 49 55  BILITOT 0.5 0.2*  PROT 7.2 5.6*  ALBUMIN 4.3 3.2*   No results for input(s): LIPASE, AMYLASE in the last 72 hours. CBC:  Recent Labs  08/22/15 1338 08/23/15 0536  WBC 6.7 6.0  NEUTROABS 3.5  --   HGB 12.5 11.2*  HCT 36.8 33.4*  MCV 84.9 85.4  PLT 252 209   Cardiac Enzymes:  Recent Labs  08/22/15 1338  TROPONINI <0.03   BNP: Invalid input(s): POCBNP D-Dimer: No results for input(s): DDIMER in the last 72 hours. Hemoglobin A1C:  Recent Labs  08/23/15 1704  HGBA1C 5.3   Fasting Lipid Panel:  Recent Labs  08/22/15 1848  CHOL 158  HDL 40*  LDLCALC 108*  TRIG 48  CHOLHDL 4.0   Thyroid Function Tests: No results for input(s): TSH, T4TOTAL, T3FREE, THYROIDAB in the last 72 hours.  Invalid input(s): FREET3 Anemia Panel: No results for input(s): VITAMINB12, FOLATE, FERRITIN, TIBC, IRON, RETICCTPCT in the last 72 hours.   PHYSICAL EXAM General: Well developed, well nourished, in no acute distress HEENT:  Normocephalic and atramatic Neck:  No JVD.  Lungs: Clear bilaterally to auscultation and percussion. Heart: HRRR . Normal S1 and S2 without  gallops or murmurs.  Abdomen: Bowel sounds are positive, abdomen soft and non-tender  Msk:  Back normal, normal gait. Normal strength and tone for age. Extremities: No clubbing, cyanosis or edema.   Neuro: Alert and oriented X 3. Psych:  Good affect, responds appropriately  TELEMETRY:  ASSESSMENT AND PLAN: Patient had nonhemorrhagic's mini strokes and is set up for TEE today. Dr. Debbrah Alar shock called and also wants to have Holter monitor done in the office also.  Principal Problem:   Right sided weakness Active Problems:   Headache   CVA (cerebral infarction)    Tammy Nancy, MD, [facility] 08/24/2015 8:27 AM

## 2015-08-24 NOTE — Progress Notes (Signed)
[facility] [place] at [facility]   PATIENT NAME: Tammy Sosa    MR#:  [id number]  DATE OF BIRTH:  Jul 15, 1971  SUBJECTIVE:  Came in with left-sided headache left upper and lower extremity weakness Symptoms resolved. Doing well. Getting TEE done later today. Having migraine symptoms. - headache some better still very nauseous  REVIEW OF SYSTEMS:   Review of Systems  Constitutional: Negative for fever, chills and weight loss.  HENT: Negative for ear discharge, ear pain and nosebleeds.   Eyes: Negative for blurred vision, pain and discharge.  Respiratory: Negative for sputum production, shortness of breath, wheezing and stridor.   Cardiovascular: Negative for chest pain, palpitations, orthopnea and PND.  Gastrointestinal: Positive for nausea. Negative for vomiting, abdominal pain and diarrhea.  Genitourinary: Negative for urgency and frequency.  Musculoskeletal: Negative for back pain and joint pain.  Neurological: Positive for weakness and headaches. Negative for sensory change, speech change and focal weakness.  Psychiatric/Behavioral: Negative for depression and hallucinations. The patient is not nervous/anxious.   All other systems reviewed and are negative.  Tolerating Diet: Yes Tolerating PT: Pending  DRUG ALLERGIES:  No Known Allergies  VITALS:  Blood pressure 107/56, pulse 62, temperature 98 F (36.7 C), temperature source Oral, resp. rate 22, height  (1.651 m), weight 85.231 kg (187 lb 14.4 oz), last menstrual period 08/20/2015, SpO2 100 %.  PHYSICAL EXAMINATION:   Physical Exam  GENERAL:  45 y.o.-year-old patient lying in the bed with no acute distress.  EYES: Pupils equal, round, reactive to light and accommodation. No scleral icterus. Extraocular muscles intact.  HEENT: Head atraumatic, normocephalic. Oropharynx and nasopharynx clear.  NECK:  Supple, no jugular venous distention. No thyroid enlargement, no tenderness.  LUNGS:  Normal breath sounds bilaterally, no wheezing, rales, rhonchi. No use of accessory muscles of respiration.  CARDIOVASCULAR: S1, S2 normal. No murmurs, rubs, or gallops.  ABDOMEN: Soft, nontender, nondistended. Bowel sounds present. No organomegaly or mass.  EXTREMITIES: No cyanosis, clubbing or edema b/l.    NEUROLOGIC: Cranial nerves II through XII are intact. No focal Motor or sensory deficits b/l.  No focal neuro deficit PSYCHIATRIC:  patient is alert and oriented x 3.  SKIN: No obvious rash, lesion, or ulcer.   LABORATORY PANEL:  CBC  Recent Labs Lab 08/23/15 0536  WBC 6.0  HGB 11.2*  HCT 33.4*  PLT 209    Chemistries   Recent Labs Lab 08/23/15 0536  NA 142  K 3.5  CL 114*  CO2 24  GLUCOSE 115*  BUN 14  CREATININE 0.66  CALCIUM 8.4*  AST 29  ALT 33  ALKPHOS 55  BILITOT 0.2*   Cardiac Enzymes  Recent Labs Lab 08/22/15 1338  TROPONINI <0.03   RADIOLOGY:  Ct Angio Head W/cm &/or Wo Cm  08/22/2015  CLINICAL DATA:  45 year old female with right-sided weakness. Headache. History migraines during pregnancy. Subsequent encounter. EXAM: CT ANGIOGRAPHY HEAD TECHNIQUE: Multidetector CT imaging of the head was performed using the standard protocol during bolus administration of intravenous contrast. Multiplanar CT image reconstructions and MIPs were obtained to evaluate the vascular anatomy. CONTRAST:  75mL OMNIPAQUE IOHEXOL 350 MG/ML SOLN COMPARISON:  08/22/2015 head CT. FINDINGS: CT HEAD Brain: No intracranial hemorrhage, CT evidence of large acute infarct or intracranial enhancing lesion. No hydrocephalus. Calvarium and skull base: Negative. Paranasal sinuses: Clear. Orbits: Negative. CTA HEAD Anterior circulation: Anterior circulation without medium or large size vessel significant stenosis or occlusion. Posterior circulation: Posterior circulation without medium or large  size vessel significant stenosis or occlusion. Venous sinuses: Patent. Anatomic variants: Negative.  Delayed phase:Negative. IMPRESSION: No medium or large size vessel significant stenosis or occlusion. No aneurysm detected. No CT evidence of large acute infarct, intracranial hemorrhage, intracranial mass or hydrocephalus. Electronically Signed   By: Lacy Duverney M.D.   On: 08/22/2015 17:31   Ct Head Wo Contrast  08/22/2015  CLINICAL DATA:  Right-sided weakness EXAM: CT HEAD WITHOUT CONTRAST TECHNIQUE: Contiguous axial images were obtained from the base of the skull through the vertex without intravenous contrast. COMPARISON:  01/19/2010 FINDINGS: The bony calvarium is intact. The ventricles are of normal size and configuration. No findings to suggest acute hemorrhage, acute infarction or space-occupying mass lesion are noted. IMPRESSION: No acute intracranial abnormality noted. These results were called by telephone at the time of interpretation on 08/22/2015 at 1:58 pm to Dr. Toney Rakes , who verbally acknowledged these results. Electronically Signed   By: Alcide Clever M.D.   On: 08/22/2015 13:59   Mr Brain Wo Contrast  08/23/2015  CLINICAL DATA:  45 year old female who with migraines presenting with acute right-sided weakness. Weakness has resolved but continued headache. Subsequent encounter. EXAM: MRI HEAD WITHOUT CONTRAST TECHNIQUE: Multiplanar, multiecho pulse sequences of the brain and surrounding structures were obtained without intravenous contrast. COMPARISON:  08/22/2015 head CT/CT angiogram. Portions of 01/19/2010 MR. FINDINGS: Very small acute nonhemorrhagic infarct left subinsular region and left periatrial region. Tiny acute nonhemorrhagic infarcts posterior left operculum region. Left middle cerebral artery branch is well visualized within the left sylvian fissure on FLAIR sequence suggesting slow flow (from proximal stenosis) or occlusion. On the prior CT angiogram, no significant stenosis of the left carotid terminus, M1 segment of the left middle cerebral artery or left middle cerebral  artery bifurcation was noted. One left middle cerebral artery M2 branch (1 cm beyond its origin) may have a proximal stenosis although difficult to confirm with certainty on CT angiogram as there is artifact at this level. No intracranial hemorrhage. No hydrocephalus. No intracranial mass lesion noted on this unenhanced exam. Minimal polypoid opacification inferior left maxillary sinus. Minimal mucosal thickening right maxillary sinus and ethmoid sinus air cells. Decreased signal intensity of bone marrow may be related to patient's habitus. Correlation with CBC to exclude anemia contributing to this appearance may be considered Partially empty non expanded sella. Orbital structures unremarkable. Cervical medullary junction fine-needle region within normal limits. IMPRESSION: Very small acute nonhemorrhagic infarct left subinsular region and left periatrial region. Tiny acute nonhemorrhagic infarcts posterior left operculum region. Left middle cerebral artery branch is well visualized within the left sylvian fissure on FLAIR sequence suggesting slow flow (from proximal stenosis) or occlusion. On the prior CT angiogram, no significant stenosis of the left carotid terminus, M1 segment of the left middle cerebral artery or left middle cerebral artery bifurcation was noted. One left middle cerebral artery M2 branch (1 cm beyond its origin) may have a proximal stenosis although difficult to confirm with certainty on CT angiogram as there is artifact at this level. Decreased signal intensity of bone marrow may be related to patient's habitus. Correlation with CBC to exclude anemia contributing to this appearance may be considered Electronically Signed   By: Lacy Duverney M.D.   On: 08/23/2015 10:01   US Carotid Bilateral  08/23/2015  CLINICAL DATA:  Acute left-sided cerebral infarction. EXAM: BILATERAL CAROTID DUPLEX ULTRASOUND TECHNIQUE: Wallace Cullens scale imaging, color Doppler and duplex ultrasound were performed of  bilateral carotid and vertebral arteries in the neck. COMPARISON:  None. FINDINGS: Criteria: Quantification of carotid stenosis is based on velocity parameters that correlate the residual internal carotid diameter with NASCET-based stenosis levels, using the diameter of the distal internal carotid lumen as the denominator for stenosis measurement. The following velocity measurements were obtained: RIGHT ICA:  89/32 cm/sec CCA:  100/27 cm/sec SYSTOLIC ICA/CCA RATIO:  0.9 DIASTOLIC ICA/CCA RATIO:  1.2 ECA:  120 cm/sec LEFT ICA:  77/30 cm/sec CCA:  109/22 cm/sec SYSTOLIC ICA/CCA RATIO:  0.7 DIASTOLIC ICA/CCA RATIO:  1.4 ECA:  109 cm/sec RIGHT CAROTID ARTERY: No focal plaque is identified. Velocities and waveforms are normal. There is no evidence of right carotid stenosis. RIGHT VERTEBRAL ARTERY: Antegrade flow with normal waveform and velocity. LEFT CAROTID ARTERY: No focal plaque is identified. Velocities and waveforms are normal. There is no evidence of left carotid stenosis. LEFT VERTEBRAL ARTERY: Antegrade flow with normal waveform and velocity. IMPRESSION: Normal carotid duplex ultrasound demonstrating no evidence of focal plaque or bilateral carotid stenosis in the neck. Both vertebral arteries demonstrate antegrade flow. Electronically Signed   By: Irish Lack M.D.   On: 08/23/2015 13:28   Dg Chest Portable 1 View  08/22/2015  CLINICAL DATA:  Possible stroke. EXAM: PORTABLE CHEST 1 VIEW COMPARISON:  None. FINDINGS: Cardiomediastinal silhouette is normal. Mediastinal contours appear intact. There is no evidence of focal airspace consolidation, pleural effusion or pneumothorax. Osseous structures are without acute abnormality. Soft tissues are grossly normal. IMPRESSION: No active disease. Electronically Signed   By: Ted Mcalpine M.D.   On: 08/22/2015 15:44   ASSESSMENT AND PLAN:  Patient is a 45 year old Caucasian female with past medical history of intermittent migraine headaches comes in with  right upper and lower extremity weakness and severe headache. She was found to have  1. 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 Emmaline Life neuro hospitalist. -Chek TEE. neg ultrasound carotid Doppler - Dr. Welton Flakes to perform TEE as part of workup for patient's with young stroke  - will need loop/holter monitor before D/C  2. Migraine: was on topamax post pregnancy but not any more - will let Neuro evaluate this.  3. Birth control pills discontinued at present patient is on norethindrone 0.35 mg daily  Case discussed with Care Management/Social Worker. Management plans discussed with the patient, family and they are in agreement.  CODE STATUS: Full  DVT Prophylaxis: Lovenox  TOTAL TIME TAKING CARE OF THIS PATIENT: 30 minutes.  >50% time spent on counselling and coordination of care  POSSIBLE D/C IN AM, DEPENDING ON CLINICAL CONDITION.  Note: This dictation was prepared with Dragon dictation along with smaller phrase technology. Any transcriptional errors that result from this process are unintentional.  [facility], Atleigh Gruen M.D on 08/24/2015 at 8:19 AM  Between 7am to 6pm - Pager - [phone]  After 6pm go to [email or web address] - password [email or web address]  [facility]  Office  [phone]  CC: Primary care physician; Towana Badger, MD

## 2015-08-24 NOTE — Progress Notes (Signed)
Subjective: Patient unchanged this morning.  Has had a severe headache.  Has a history of migraine but they have not been frequent.  Has been on Topamax in the past.    Objective: Current vital signs: BP 102/55 mmHg  Pulse 48  Temp(Src) 97.9 F (36.6 C) (Oral)  Resp 16  Ht  (1.651 m)  Wt 85.231 kg (187 lb 14.4 oz)  BMI 31.27 kg/m2  SpO2 96%  LMP 08/20/2015 Vital signs in last 24 hours: Temp:  [97.4 F (36.3 C)-98.4 F (36.9 C)] 97.9 F (36.6 C) (01/23 0856) Pulse Rate:  [48-66] 48 (01/23 1149) Resp:  [16-22] 16 (01/23 0856) BP: (99-107)/(47-63) 102/55 mmHg (01/23 1149) SpO2:  [96 %-100 %] 96 % (01/23 1149) Weight:  [85.231 kg (187 lb 14.4 oz)] 85.231 kg (187 lb 14.4 oz) (01/23 0500)  Intake/Output from previous day:   Intake/Output this shift:   Nutritional status: Diet NPO time specified  Neurologic Exam: Mental Status: Alert, oriented, thought content appropriate. Speech fluent without evidence of aphasia. Able to follow 3 step commands without difficulty. Cranial Nerves: II: Discs flat bilaterally; Visual fields grossly normal, pupils equal, round, reactive to light and accommodation III,IV, VI: ptosis not present, extra-ocular motions intact bilaterally V,VII: smile symmetric, facial light touch sensation normal bilaterally VIII: hearing normal bilaterally IX,X: gag reflex present XI: bilateral shoulder shrug XII: midline tongue extension Motor: Right :Upper extremity 5/5Left: Upper extremity 5/5 Lower extremity 5/5Lower extremity 5/5   Lab Results: Basic Metabolic Panel:  Recent Labs Lab 08/22/15 1338 08/23/15 0536  NA 138 142  K 3.7 3.5  CL 108 114*  CO2 24 24  GLUCOSE 95 115*  BUN 11 14  CREATININE 0.79 0.66  CALCIUM 9.0 8.4*    Liver Function Tests:  Recent Labs Lab 08/22/15 1338 08/23/15 0536  AST 25 29  ALT 20 33   ALKPHOS 49 55  BILITOT 0.5 0.2*  PROT 7.2 5.6*  ALBUMIN 4.3 3.2*   No results for input(s): LIPASE, AMYLASE in the last 168 hours. No results for input(s): AMMONIA in the last 168 hours.  CBC:  Recent Labs Lab 08/22/15 1338 08/23/15 0536  WBC 6.7 6.0  NEUTROABS 3.5  --   HGB 12.5 11.2*  HCT 36.8 33.4*  MCV 84.9 85.4  PLT 252 209    Cardiac Enzymes:  Recent Labs Lab 08/22/15 1338  TROPONINI <0.03    Lipid Panel:  Recent Labs Lab 08/22/15 1848  CHOL 158  TRIG 48  HDL 40*  CHOLHDL 4.0  VLDL 10  LDLCALC 696*    CBG: No results for input(s): GLUCAP in the last 168 hours.  Microbiology: No results found for this or any previous visit.  Coagulation Studies:  Recent Labs  08/22/15 1338  LABPROT 13.7  INR 1.03    Imaging: Ct Angio Head W/cm &/or Wo Cm  08/22/2015  CLINICAL DATA:  45 year old female with right-sided weakness. Headache. History migraines during pregnancy. Subsequent encounter. EXAM: CT ANGIOGRAPHY HEAD TECHNIQUE: Multidetector CT imaging of the head was performed using the standard protocol during bolus administration of intravenous contrast. Multiplanar CT image reconstructions and MIPs were obtained to evaluate the vascular anatomy. CONTRAST:  75mL OMNIPAQUE IOHEXOL 350 MG/ML SOLN COMPARISON:  08/22/2015 head CT. FINDINGS: CT HEAD Brain: No intracranial hemorrhage, CT evidence of large acute infarct or intracranial enhancing lesion. No hydrocephalus. Calvarium and skull base: Negative. Paranasal sinuses: Clear. Orbits: Negative. CTA HEAD Anterior circulation: Anterior circulation without medium or large size vessel significant stenosis or  occlusion. Posterior circulation: Posterior circulation without medium or large size vessel significant stenosis or occlusion. Venous sinuses: Patent. Anatomic variants: Negative. Delayed phase:Negative. IMPRESSION: No medium or large size vessel significant stenosis or occlusion. No aneurysm detected. No CT  evidence of large acute infarct, intracranial hemorrhage, intracranial mass or hydrocephalus. Electronically Signed   By: Lacy Duverney M.D.   On: 08/22/2015 17:31   Ct Head Wo Contrast  08/22/2015  CLINICAL DATA:  Right-sided weakness EXAM: CT HEAD WITHOUT CONTRAST TECHNIQUE: Contiguous axial images were obtained from the base of the skull through the vertex without intravenous contrast. COMPARISON:  01/19/2010 FINDINGS: The bony calvarium is intact. The ventricles are of normal size and configuration. No findings to suggest acute hemorrhage, acute infarction or space-occupying mass lesion are noted. IMPRESSION: No acute intracranial abnormality noted. These results were called by telephone at the time of interpretation on 08/22/2015 at 1:58 pm to Dr. Toney Rakes , who verbally acknowledged these results. Electronically Signed   By: Alcide Clever M.D.   On: 08/22/2015 13:59   Mr Brain Wo Contrast  08/23/2015  CLINICAL DATA:  45 year old female who with migraines presenting with acute right-sided weakness. Weakness has resolved but continued headache. Subsequent encounter. EXAM: MRI HEAD WITHOUT CONTRAST TECHNIQUE: Multiplanar, multiecho pulse sequences of the brain and surrounding structures were obtained without intravenous contrast. COMPARISON:  08/22/2015 head CT/CT angiogram. Portions of 01/19/2010 MR. FINDINGS: Very small acute nonhemorrhagic infarct left subinsular region and left periatrial region. Tiny acute nonhemorrhagic infarcts posterior left operculum region. Left middle cerebral artery branch is well visualized within the left sylvian fissure on FLAIR sequence suggesting slow flow (from proximal stenosis) or occlusion. On the prior CT angiogram, no significant stenosis of the left carotid terminus, M1 segment of the left middle cerebral artery or left middle cerebral artery bifurcation was noted. One left middle cerebral artery M2 branch (1 cm beyond its origin) may have a proximal stenosis  although difficult to confirm with certainty on CT angiogram as there is artifact at this level. No intracranial hemorrhage. No hydrocephalus. No intracranial mass lesion noted on this unenhanced exam. Minimal polypoid opacification inferior left maxillary sinus. Minimal mucosal thickening right maxillary sinus and ethmoid sinus air cells. Decreased signal intensity of bone marrow may be related to patient's habitus. Correlation with CBC to exclude anemia contributing to this appearance may be considered Partially empty non expanded sella. Orbital structures unremarkable. Cervical medullary junction fine-needle region within normal limits. IMPRESSION: Very small acute nonhemorrhagic infarct left subinsular region and left periatrial region. Tiny acute nonhemorrhagic infarcts posterior left operculum region. Left middle cerebral artery branch is well visualized within the left sylvian fissure on FLAIR sequence suggesting slow flow (from proximal stenosis) or occlusion. On the prior CT angiogram, no significant stenosis of the left carotid terminus, M1 segment of the left middle cerebral artery or left middle cerebral artery bifurcation was noted. One left middle cerebral artery M2 branch (1 cm beyond its origin) may have a proximal stenosis although difficult to confirm with certainty on CT angiogram as there is artifact at this level. Decreased signal intensity of bone marrow may be related to patient's habitus. Correlation with CBC to exclude anemia contributing to this appearance may be considered Electronically Signed   By: Lacy Duverney M.D.   On: 08/23/2015 10:01   US Carotid Bilateral  08/23/2015  CLINICAL DATA:  Acute left-sided cerebral infarction. EXAM: BILATERAL CAROTID DUPLEX ULTRASOUND TECHNIQUE: Wallace Cullens scale imaging, color Doppler and duplex ultrasound were performed of bilateral  carotid and vertebral arteries in the neck. COMPARISON:  None. FINDINGS: Criteria: Quantification of carotid stenosis is  based on velocity parameters that correlate the residual internal carotid diameter with NASCET-based stenosis levels, using the diameter of the distal internal carotid lumen as the denominator for stenosis measurement. The following velocity measurements were obtained: RIGHT ICA:  89/32 cm/sec CCA:  100/27 cm/sec SYSTOLIC ICA/CCA RATIO:  0.9 DIASTOLIC ICA/CCA RATIO:  1.2 ECA:  120 cm/sec LEFT ICA:  77/30 cm/sec CCA:  109/22 cm/sec SYSTOLIC ICA/CCA RATIO:  0.7 DIASTOLIC ICA/CCA RATIO:  1.4 ECA:  109 cm/sec RIGHT CAROTID ARTERY: No focal plaque is identified. Velocities and waveforms are normal. There is no evidence of right carotid stenosis. RIGHT VERTEBRAL ARTERY: Antegrade flow with normal waveform and velocity. LEFT CAROTID ARTERY: No focal plaque is identified. Velocities and waveforms are normal. There is no evidence of left carotid stenosis. LEFT VERTEBRAL ARTERY: Antegrade flow with normal waveform and velocity. IMPRESSION: Normal carotid duplex ultrasound demonstrating no evidence of focal plaque or bilateral carotid stenosis in the neck. Both vertebral arteries demonstrate antegrade flow. Electronically Signed   By: Irish Lack M.D.   On: 08/23/2015 13:28   Dg Chest Portable 1 View  08/22/2015  CLINICAL DATA:  Possible stroke. EXAM: PORTABLE CHEST 1 VIEW COMPARISON:  None. FINDINGS: Cardiomediastinal silhouette is normal. Mediastinal contours appear intact. There is no evidence of focal airspace consolidation, pleural effusion or pneumothorax. Osseous structures are without acute abnormality. Soft tissues are grossly normal. IMPRESSION: No active disease. Electronically Signed   By: Ted Mcalpine M.D.   On: 08/22/2015 15:44    Medications:  I have reviewed the patient's current medications. Scheduled: . aspirin EC  325 mg Oral Daily  . atorvastatin  40 mg Oral q1800  . butamben-tetracaine-benzocaine      . docusate sodium  100 mg Oral BID  . fentaNYL      . heparin  5,000 Units  Subcutaneous 3 times per day  . lidocaine      . midazolam      . sodium chloride  3 mL Intravenous Q12H    Assessment/Plan: Headaches have increased.  This can be seen after acute infarcts.  Patient on ASA.  Cardiac work up remains in progress.   A1c 5.3, ATIII 93.  Remaining lab work pending.     Recommendations: 1.  Would not start daily prophylactic medication at this time.  Expect headaches to improve in frequency.  Would consider Ultram  prn headache for use at home.   LOS: 1 day   Thana Farr, MD Neurology [phone] 08/24/2015  11:59 AM

## 2015-08-25 ENCOUNTER — Inpatient Hospital Stay: Payer: BLUE CROSS/BLUE SHIELD

## 2015-08-25 MED ORDER — ATORVASTATIN CALCIUM 40 MG PO TABS: 40.0000 mg | ORAL_TABLET | Freq: Every day | ORAL | Status: DC

## 2015-08-25 MED ORDER — ASPIRIN 325 MG PO TBEC: 325.0000 mg | DELAYED_RELEASE_TABLET | Freq: Every day | ORAL | Status: DC

## 2015-08-25 NOTE — Discharge Instructions (Signed)
Stroke Prevention Some medical conditions and behaviors are associated with an increased chance of having a stroke. You may prevent a stroke by making healthy choices and managing medical conditions. HOW CAN I REDUCE MY RISK OF HAVING A STROKE?   Stay physically active. Get at least 30 minutes of activity on most or all days.  Do not smoke. It may also be helpful to avoid exposure to secondhand smoke.  Limit alcohol use. Moderate alcohol use is considered to be:  No more than 2 drinks per day for men.  No more than 1 drink per day for nonpregnant women.  Eat healthy foods. This involves:  Eating 5 or more servings of fruits and vegetables a day.  Making dietary changes that address high blood pressure (hypertension), high cholesterol, diabetes, or obesity.  Manage your cholesterol levels.  Making food choices that are high in fiber and low in saturated fat, trans fat, and cholesterol may control cholesterol levels.  Take any prescribed medicines to control cholesterol as directed by your health care provider.  Manage your diabetes.  Controlling your carbohydrate and sugar intake is recommended to manage diabetes.  Take any prescribed medicines to control diabetes as directed by your health care provider.  Control your hypertension.  Making food choices that are low in salt (sodium), saturated fat, trans fat, and cholesterol is recommended to manage hypertension.  Ask your health care provider if you need treatment to lower your blood pressure. Take any prescribed medicines to control hypertension as directed by your health care provider.  If you are 18-39 years of age, have your blood pressure checked every 3-5 years. If you are 40 years of age or older, have your blood pressure checked every year.  Maintain a healthy weight.  Reducing calorie intake and making food choices that are low in sodium, saturated fat, trans fat, and cholesterol are recommended to manage  weight.  Stop drug abuse.  Avoid taking birth control pills.  Talk to your health care provider about the risks of taking birth control pills if you are over 35 years old, smoke, get migraines, or have ever had a blood clot.  Get evaluated for sleep disorders (sleep apnea).  Talk to your health care provider about getting a sleep evaluation if you snore a lot or have excessive sleepiness.  Take medicines only as directed by your health care provider.  For some people, aspirin or blood thinners (anticoagulants) are helpful in reducing the risk of forming abnormal blood clots that can lead to stroke. If you have the irregular heart rhythm of atrial fibrillation, you should be on a blood thinner unless there is a good reason you cannot take them.  Understand all your medicine instructions.  Make sure that other conditions (such as anemia or atherosclerosis) are addressed. SEEK IMMEDIATE MEDICAL CARE IF:   You have sudden weakness or numbness of the face, arm, or leg, especially on one side of the body.  Your face or eyelid droops to one side.  You have sudden confusion.  You have trouble speaking (aphasia) or understanding.  You have sudden trouble seeing in one or both eyes.  You have sudden trouble walking.  You have dizziness.  You have a loss of balance or coordination.  You have a sudden, severe headache with no known cause.  You have new chest pain or an irregular heartbeat. Any of these symptoms may represent a serious problem that is an emergency. Do not wait to see if the symptoms will   go away. Get medical help at once. Call your local emergency services (911 in [place] Do not drive yourself to the hospital.   This information is not intended to replace advice given to you by your health care provider. Make sure you discuss any questions you have with your health care provider.   Document Released: 08/25/2004 Document Revised: 08/08/2014 Document Reviewed:  01/18/2013 Elsevier Interactive Patient Education 2016 Elsevier Inc.  

## 2015-08-25 NOTE — Progress Notes (Signed)
SUBJECTIVE: Pt is doing very well.    Filed Vitals:   08/24/15 1317 08/24/15 1347 08/24/15 2130 08/25/15 0620  BP: 129/69 109/53 93/50 93/52   Pulse: 53 49 54 48  Temp:  97.8 F (36.6 C) 98.5 F (36.9 C) 98.6 F (37 C)  TempSrc:  Oral Oral   Resp: Height:      Weight:    180 lb (81.647 kg)  SpO2: 97% 97% 98% 98%   No intake or output data in the 24 hours ending 08/25/15 0835  LABS: Basic Metabolic Panel:  Recent Labs  16/10/96 1338 08/23/15 0536  NA 138 142  K 3.7 3.5  CL 108 114*  CO2 24 24  GLUCOSE 95 115*  BUN 11 14  CREATININE 0.79 0.66  CALCIUM 9.0 8.4*   Liver Function Tests:  Recent Labs  08/22/15 1338 08/23/15 0536  AST 25 29  ALT 20 33  ALKPHOS 49 55  BILITOT 0.5 0.2*  PROT 7.2 5.6*  ALBUMIN 4.3 3.2*   No results for input(s): LIPASE, AMYLASE in the last 72 hours. CBC:  Recent Labs  08/22/15 1338 08/23/15 0536  WBC 6.7 6.0  NEUTROABS 3.5  --   HGB 12.5 11.2*  HCT 36.8 33.4*  MCV 84.9 85.4  PLT 252 209   Cardiac Enzymes:  Recent Labs  08/22/15 1338  TROPONINI <0.03   BNP: Invalid input(s): POCBNP D-Dimer: No results for input(s): DDIMER in the last 72 hours. Hemoglobin A1C:  Recent Labs  08/23/15 1704  HGBA1C 5.3   Fasting Lipid Panel:  Recent Labs  08/22/15 1848  CHOL 158  HDL 40*  LDLCALC 108*  TRIG 48  CHOLHDL 4.0   Thyroid Function Tests: No results for input(s): TSH, T4TOTAL, T3FREE, THYROIDAB in the last 72 hours.  Invalid input(s): FREET3 Anemia Panel: No results for input(s): VITAMINB12, FOLATE, FERRITIN, TIBC, IRON, RETICCTPCT in the last 72 hours.   PHYSICAL EXAM General: Well developed, well nourished, in no acute distress HEENT:  Normocephalic and atramatic Neck:  No JVD.  Lungs: Clear bilaterally to auscultation and percussion. Heart: HRRR . Normal S1 and S2 without gallops or murmurs.  Abdomen: Bowel sounds are positive, abdomen soft and non-tender  Msk:  Back normal, normal  gait. Normal strength and tone for age. Extremities: No clubbing, cyanosis or edema.   Neuro: Alert and oriented X 3. 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 Going home today after ultra sound of the leg to rule out DVT is done. She will be coming to our office to get a holter and follow up. We will set her up for ASD repair 6 weeks from now.  Principal Problem:   Right sided weakness Active Problems:   Headache   CVA (cerebral infarction)    Laurier Nancy, MD, [facility] 08/25/2015 8:35 AM

## 2015-08-26 NOTE — Discharge Summary (Signed)
[place] [facility] [place] NAME: Tammy Sosa    MR#:  [id number]  DATE OF BIRTH:  Aug 11, 1970  DATE OF ADMISSION:  08/22/2015 ADMITTING PHYSICIAN: Idelle Crouch, MD  DATE OF DISCHARGE: 08/25/2015 11:10 AM  PRIMARY CARE PHYSICIAN: Rosina Lowenstein, MD    ADMISSION DIAGNOSIS:  Weakness [R53.1] Right arm weakness [R29.898] Right sided weakness [M62.89] Acute nonintractable headache, unspecified headache type [R51]  DISCHARGE DIAGNOSIS:  Principal Problem:   Right sided weakness Active Problems:   Headache   CVA (cerebral infarction) no residual deficits - not requiring any therapy due to same. SECONDARY DIAGNOSIS:   Past Medical History  Diagnosis Date  . Asthma     HOSPITAL COURSE:  45 y.o. female with H/O migraine who reports that she was at home with family. She bent down to pick something up and could not get up on her own. She experienced a severe headache. When her family came to help her she could not get her words out and they noticed a right facial droop. Patient was admitted for stroke work up. Please see Dr Stacie Glaze dictated [place] for further details.   Her symptoms were completely resolved not requiring any therapy evaluations. MRI showed a small left periatrial and subinsular acute infarcts. CTA shows no vessel occlusion. LDL elevated at 108. ESR 15. Carotid dopplers are unremarkable. Neuro c/s was obtained who recommended TEE which was performed showing ASD with positive bubble study. After d/w cardio and Neuro decision was made to perform LE dopplers to r/o DVT which was negative.  Neuro didn't recommend full dose anticoagulation per recent literature for stroke. She was D/Ced on full dose ASA and high intensity STATIN per Neuro recommendations and outpt holter monitoring.  Patient was agreeable with same. And was D/C home in stable condition. DISCHARGE CONDITIONS:   STABLE  CONSULTS OBTAINED:  Treatment  Team:  Alexis Goodell, MD Dionisio David, MD  DRUG ALLERGIES:  No Known Allergies  DISCHARGE MEDICATIONS:   Discharge Medication List as of 08/25/2015  9:55 AM    START taking these medications   Details  aspirin EC 325 MG EC tablet Take 1 tablet (325 mg total) by mouth daily., Starting 08/25/2015, Until Discontinued, Normal    atorvastatin (LIPITOR) 40 MG tablet Take 1 tablet (40 mg total) by mouth daily at 6 PM., Starting 08/25/2015, Until Discontinued, Normal      CONTINUE these medications which have NOT CHANGED   Details  diclofenac sodium (VOLTAREN) 1 % GEL Apply 2 g topically 4 (four) times daily. Rub into affected area of foot 2 to 4 times daily, Starting 01/01/2015, Until Discontinued, Normal      STOP taking these medications     norethindrone (MICRONOR,CAMILA,ERRIN) 0.35 MG tablet          DISCHARGE INSTRUCTIONS:    DIET:  Regular diet  DISCHARGE CONDITION:  Good  ACTIVITY:  Activity as tolerated  OXYGEN:  Home Oxygen: No.   Oxygen Delivery: room air  DISCHARGE LOCATION:  home   If you experience worsening of your admission symptoms, develop shortness of breath, life threatening emergency, suicidal or homicidal thoughts you must seek medical attention immediately by calling 911 or calling your MD immediately  if symptoms less severe.  You Must read complete instructions/literature along with all the possible adverse reactions/side effects for all the Medicines you take and that have been prescribed to you. Take any new Medicines after you have completely understood  and accpet all the possible adverse reactions/side effects.   Please note  You were cared for by a hospitalist during your hospital stay. If you have any questions about your discharge medications or the care you received while you were in the hospital after you are discharged, you can call the unit and asked to speak with the hospitalist on call if the hospitalist that took care of you is  not available. Once you are discharged, your primary care physician will handle any further medical issues. Please note that NO REFILLS for any discharge medications will be authorized once you are discharged, as it is imperative that you return to your primary care physician (or establish a relationship with a primary care physician if you do not have one) for your aftercare needs so that they can reassess your need for medications and monitor your lab values.    On the day of Discharge:  VITAL SIGNS:  Blood pressure 93/52, pulse 48, temperature 98.6 F (37 C), temperature source Oral, resp. rate 18, height 5' 5"  (1.651 m), weight 81.647 kg (180 lb), last menstrual period 08/20/2015, SpO2 98 %.  PHYSICAL EXAMINATION:  GENERAL:  45 y.o.-year-old patient lying in the bed with no acute distress.  EYES: Pupils equal, round, reactive to light and accommodation. No scleral icterus. Extraocular muscles intact.  HEENT: Head atraumatic, normocephalic. Oropharynx and nasopharynx clear.  NECK:  Supple, no jugular venous distention. No thyroid enlargement, no tenderness.  LUNGS: Normal breath sounds bilaterally, no wheezing, rales,rhonchi or crepitation. No use of accessory muscles of respiration.  CARDIOVASCULAR: S1, S2 normal. No murmurs, rubs, or gallops.  ABDOMEN: Soft, non-tender, non-distended. Bowel sounds present. No organomegaly or mass.  EXTREMITIES: No pedal edema, cyanosis, or clubbing.  NEUROLOGIC: Cranial nerves II through XII are intact. Muscle strength 5/5 in all extremities. Sensation intact. Gait not checked.  PSYCHIATRIC: The patient is alert and oriented x 3.  SKIN: No obvious rash, lesion, or ulcer.  DATA REVIEW:   CBC  Recent Labs Lab 08/23/15 0536  WBC 6.0  HGB 11.2*  HCT 33.4*  PLT 209    Chemistries   Recent Labs Lab 08/23/15 0536  NA 142  K 3.5  CL 114*  CO2 24  GLUCOSE 115*  BUN 14  CREATININE 0.66  CALCIUM 8.4*  AST 29  ALT 33  ALKPHOS 55  BILITOT  0.2*    Cardiac Enzymes  Recent Labs Lab 08/22/15 [place] <0.03    Microbiology Results  No results found for this or any previous visit.  RADIOLOGY:  US Venous Img Lower Bilateral  08/25/2015  CLINICAL DATA:  Bilateral lower extremity swelling for 1 day. Evaluate for DVT. EXAM: BILATERAL LOWER EXTREMITY VENOUS DOPPLER ULTRASOUND TECHNIQUE: Gray-scale sonography with graded compression, as well as color Doppler and duplex ultrasound were performed to evaluate the lower extremity deep venous systems from the level of the common femoral vein and including the common femoral, femoral, profunda femoral, popliteal and calf veins including the posterior tibial, peroneal and gastrocnemius veins when visible. The superficial great saphenous vein was also interrogated. Spectral Doppler was utilized to evaluate flow at rest and with distal augmentation maneuvers in the common femoral, femoral and popliteal veins. COMPARISON:  None. FINDINGS: RIGHT LOWER EXTREMITY Common Femoral Vein: No evidence of thrombus. Normal compressibility, respiratory phasicity and response to augmentation. Saphenofemoral Junction: No evidence of thrombus. Normal compressibility and flow on color Doppler imaging. Profunda Femoral Vein: No evidence of thrombus. Normal compressibility and flow on color  Doppler imaging. Femoral Vein: No evidence of thrombus. Normal compressibility, respiratory phasicity and response to augmentation. Popliteal Vein: No evidence of thrombus. Normal compressibility, respiratory phasicity and response to augmentation. Calf Veins: No evidence of thrombus. Normal compressibility and flow on color Doppler imaging. Superficial Great Saphenous Vein: No evidence of thrombus. Normal compressibility and flow on color Doppler imaging. Venous Reflux:  None. Other Findings:  None. LEFT LOWER EXTREMITY Common Femoral Vein: No evidence of thrombus. Normal compressibility, respiratory phasicity and response to  augmentation. Saphenofemoral Junction: No evidence of thrombus. Normal compressibility and flow on color Doppler imaging. Profunda Femoral Vein: No evidence of thrombus. Normal compressibility and flow on color Doppler imaging. Femoral Vein: No evidence of thrombus. Normal compressibility, respiratory phasicity and response to augmentation. Popliteal Vein: No evidence of thrombus. Normal compressibility, respiratory phasicity and response to augmentation. Calf Veins: No evidence of thrombus. Normal compressibility and flow on color Doppler imaging. Superficial Great Saphenous Vein: No evidence of thrombus. Normal compressibility and flow on color Doppler imaging. Venous Reflux:  None. Other Findings:  None. IMPRESSION: No evidence of DVT within either lower extremity. Electronically Signed   By: Sandi Mariscal M.D.   On: 08/25/2015 09:28     Management plans discussed with the patient, family and they are in agreement.  CODE STATUS:  Code Status History    Date Active Date Inactive Code Status Order ID Comments User Context   08/22/2015  6:16 PM 08/25/2015  2:10 PM Full Code [id number]  Idelle Crouch, MD Inpatient      TOTAL TIME TAKING CARE OF THIS PATIENT: 55 minutes.    [facility], Veralyn Lopp M.D on 08/26/2015 at 3:18 PM  Between 7am to 6pm - Pager - [phone]  After 6pm go to [email or web address] - password [email or web address] [facility]  Office  [phone]  CC: Primary care physician; Rosina Lowenstein, MD Alexis Goodell, MD Dionisio David, MD   Note: This dictation was prepared with Dragon dictation along with smaller phrase technology. Any transcriptional errors that result from this process are unintentional.

## 2015-08-26 NOTE — Progress Notes (Signed)
[facility] - [place] at [facility] was admitted to the Hospital on 08/22/2015 and Discharged 08/25/2015 and should be excused from work for 3 days starting 08/22/2015 , may return to work without any restrictions starting 08/26/2015.  Delfino Lovett M.D on 08/26/2015,at 3:26 PM  [facility] - [place] at [facility]  [phone]

## 2015-08-27 LAB — CARDIOLIPIN ANTIBODIES, IGG, IGM, IGA
Anticardiolipin IgA: <9 APL U/mL (ref 0–11)
Anticardiolipin IgG: <9 GPL U/mL (ref 0–14)
Anticardiolipin IgM: 11 MPL U/mL (ref 0–12)

## 2015-08-27 LAB — PROTEIN S ACTIVITY: PROTEIN S ACTIVITY: 83 % (ref 63–140)

## 2015-08-27 LAB — LUPUS ANTICOAGULANT PANEL
DRVVT: 34.4 s (ref 0.0–44.0)
PTT LA: 40.4 s (ref 0.0–40.6)

## 2015-08-27 LAB — BETA-2-GLYCOPROTEIN I ABS, IGG/M/A
BETA-2-GLYCOPROTEIN I IGM: 17 GPI IgM units (ref 0–32)
Beta-2 Glyco I IgG: <9 GPI IgG units (ref 0–20)
Beta-2-Glycoprotein I IgA: <9 GPI IgA units (ref 0–25)

## 2015-08-27 LAB — FACTOR 5 LEIDEN

## 2015-08-27 LAB — HOMOCYSTEINE: Homocysteine: 8.3 umol/L (ref 0.0–15.0)

## 2015-08-27 LAB — ANTINUCLEAR ANTIBODIES, IFA: ANA Ab, IFA: NEGATIVE

## 2015-08-27 LAB — PROTHROMBIN GENE MUTATION

## 2015-08-27 LAB — PROTEIN S, TOTAL: Protein S Ag, Total: 110 % (ref 60–150)

## 2015-08-27 LAB — PROTEIN C ACTIVITY: PROTEIN C ACTIVITY: 107 % (ref 73–180)

## 2015-08-27 LAB — PROTEIN C, TOTAL: PROTEIN C, TOTAL: 99 % (ref 60–150)

## 2015-10-07 ENCOUNTER — Other Ambulatory Visit: Payer: Self-pay | Admitting: Internal Medicine

## 2015-10-07 DIAGNOSIS — Z1231 Encounter for screening mammogram for malignant neoplasm of breast: Secondary | ICD-10-CM

## 2015-10-15 ENCOUNTER — Encounter: Payer: Self-pay | Admitting: Physical Therapy

## 2015-10-15 ENCOUNTER — Ambulatory Visit
Admission: RE | Admit: 2015-10-15 | Discharge: 2015-10-15 | Disposition: A | Discharge status: Home / Self Care | Payer: BLUE CROSS/BLUE SHIELD | Source: Ambulatory Visit | Attending: Internal Medicine | Admitting: Internal Medicine

## 2015-10-15 ENCOUNTER — Ambulatory Visit: Payer: BLUE CROSS/BLUE SHIELD | Attending: Neurology | Admitting: Physical Therapy

## 2015-10-15 DIAGNOSIS — R531 Weakness: Secondary | ICD-10-CM | POA: Insufficient documentation

## 2015-10-15 DIAGNOSIS — Z1231 Encounter for screening mammogram for malignant neoplasm of breast: Secondary | ICD-10-CM

## 2015-10-15 DIAGNOSIS — I69898 Other sequelae of other cerebrovascular disease: Secondary | ICD-10-CM | POA: Insufficient documentation

## 2015-10-15 DIAGNOSIS — IMO0002 Reserved for concepts with insufficient information to code with codable children: Secondary | ICD-10-CM

## 2015-10-15 NOTE — Addendum Note (Signed)
Addended by: Ezekiel InaMANSFIELD, KRISTINE S on: 10/15/2015 05:58 PM   Modules accepted: Orders

## 2015-10-15 NOTE — Therapy (Addendum)
[facility] [facility] MAIN [facility] SERVICES [address] [place], [place], [address] Phone: [phone]   Fax:  [phone]  Physical Therapy Evaluation  Patient Details  Name: IOANA LOUKS MRN: [id number] Date of Birth: 1970-08-19 Referring Provider: Clelia Croft  Encounter Date: 10/15/2015      PT End of Session - 10/15/15 1509    Visit Number 1   Date for PT Re-Evaluation 12/10/15   PT Start Time 0300   PT Stop Time 0345   PT Time Calculation (min) 45 min      Past Medical History  Diagnosis Date  . Asthma     Past Surgical History  Procedure Laterality Date  . Knee surgery    . Knee surgery      There were no vitals filed for this visit.  Visit Diagnosis:  Weakness due to cerebrovascular accident      Subjective Assessment - 10/15/15 1506    Subjective Patient says that right side is weak mostly in her arm.    Pain Score 3    Pain Location Shoulder   Pain Orientation Right   Pain Descriptors / Indicators Throbbing   Pain Type Acute pain   Pain Onset Today            [facility] PT Assessment - 10/15/15 1510    Assessment   Medical Diagnosis cva   Referring Provider shaw   Onset Date/Surgical Date 08/21/15   Hand Dominance Right   Prior Therapy no   Restrictions   Weight Bearing Restrictions No   Balance Screen   Has the patient fallen in the past 6 months No   Has the patient had a decrease in activity level because of a fear of falling?  Yes   Is the patient reluctant to leave their home because of a fear of falling?  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   Cognition   Overall Cognitive Status Within Functional Limits for tasks assessed       PAIN: right shoulder 3/10  POSTURE: WFL   PROM/AROM: WFL  STRENGTH:  Graded on a 0-5 scale Muscle Group Left Right  Shoulder flex 5 4  Shoulder Abd 5 4  Shoulder Ext 5 4  Shoulder IR/ER 5 4  Elbow 5 4  Wrist/hand 5 4  Hip Flex 5 4  Hip Abd 5 4  Hip Add 5 4  Hip Ext 5 4  Hip IR/ER 5 4  Knee Flex 5 4  Knee Ext 5 4  Ankel DF 5 4  Ankle PF 5 4   SENSATION: intact      FUNCTIONAL MOBILITY: independent   BALANCE: 25 sec tandem stand   GAIT: Independent without AD  OUTCOME MEASURES: TEST Outcome Interpretation  5 times sit<>stand 13.30sec >60 yo, >15 sec indicates increased risk for falls  10 meter walk test  1.70               m/s <1.0 m/s indicates increased risk for falls; limited community ambulator  Timed  up and Go   6.30              sec <14 sec indicates increased risk for falls  6 minute walk test     1530           Feet 1000 feet is community Financial controllerambulator  Berg Balance Assessment  <36/56 (100% risk for falls), 37-45 (80% risk for falls); 46-51 (>50% risk for falls); 52-55 (lower risk <25% of falls)  9 Hole Peg Test L:     18.37           R: 20.40                          PT Education - 10/15/15 1507    Education provided Yes   Education Details HEP   Person(s) Educated Patient   Methods Explanation   Comprehension Verbalized understanding                     Problem List Patient Active Problem List   Diagnosis Date Noted  . CVA (cerebral infarction) 08/23/2015  . Right sided weakness 08/22/2015  . Headache 08/22/2015   Ezekiel InaKristine S Jarrod Mcenery, PT, DPT [place], Barkley BrunsKristine S 10/15/2015, 3:15 PM  [facility] [address] [place], [place], [address] Phone: [phone]   Fax:  [phone]  Name: Ericka Pontiffngelic R  Schwanke MRN: [id number] Date of Birth: 11/02/1970

## 2015-10-19 ENCOUNTER — Ambulatory Visit: Payer: BLUE CROSS/BLUE SHIELD | Admitting: Physical Therapy

## 2015-10-21 ENCOUNTER — Ambulatory Visit: Payer: BLUE CROSS/BLUE SHIELD | Admitting: Physical Therapy

## 2015-10-22 ENCOUNTER — Ambulatory Visit: Payer: BLUE CROSS/BLUE SHIELD | Admitting: Physical Therapy

## 2015-10-22 ENCOUNTER — Encounter: Payer: Self-pay | Admitting: Physical Therapy

## 2015-10-22 DIAGNOSIS — IMO0002 Reserved for concepts with insufficient information to code with codable children: Secondary | ICD-10-CM

## 2015-10-22 DIAGNOSIS — I69898 Other sequelae of other cerebrovascular disease: Secondary | ICD-10-CM | POA: Diagnosis not present

## 2015-10-22 NOTE — Therapy (Signed)
[facility] [address] [place], [place], [address] Phone: [phone]   Fax:  [phone]  Physical Therapy Treatment  Patient Details  Name: Tammy Sosa MRN: [id number] Date of Birth: 07/09/1971 Referring Provider: Clelia Croftshaw  Encounter Date: 10/22/2015      PT End of Session - 10/22/15 1350    Visit Number 2   Date for PT Re-Evaluation 12/10/15   PT Start Time 0145   PT Stop Time 0230   PT Time Calculation (min) 45 min      Past Medical History  Diagnosis Date  . Asthma     Past Surgical History  Procedure Laterality Date  . Knee surgery    . Knee surgery      There were no vitals filed for this visit.  Visit Diagnosis:  Weakness due to cerebrovascular accident      Subjective Assessment - 10/22/15 1349    Subjective Patient says that right side is weak  and she is also pregnant.    Currently in Pain? No/denies   Pain Onset Today     Therapeutic exercise: standing hip abd with YTB x 20  side stepping left and right with YTB 10 feet x 3 step ups from floor to 6 inch stool x 20 bilateral sit to stand x 10 marching in parallel bars x 20 Cross training  x 10 minutes TM x 10 mins  UBE x 10 mins L4 Patient needs occasional verbal cueing to improve posture and cueing to correctly perform exercises slowly, holding at end of range to increase motor firing of desired muscle to encourage fatigue.                            PT Education - 10/22/15 1350    Education provided Yes   Education Details HEP   Person(s) Educated Patient   Methods Explanation   Comprehension Verbalized understanding             PT Long Term Goals - 10/15/15 1540    PT LONG TERM GOAL #1   Title Patient will be independent in home exercise program to improve strength/mobility for better functional independence with ADLs.   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    Pt will benefit from skilled therapeutic intervention in order to improve on the following deficits Decreased strength;Decreased balance   PT Frequency 1x / week   PT Duration 8 weeks        Problem List Patient Active Problem List   Diagnosis Date Noted  . CVA (cerebral infarction) 08/23/2015  . Right sided weakness 08/22/2015  . Headache 08/22/2015   Ezekiel InaKristine S Rayelle Armor, PT, DPT [place], Barkley BrunsKristine S 10/22/2015, 1:54 PM  [place]  [facility] MAIN [facility] SERVICES [address] [place], [place], [address] Phone: [phone]   Fax:  [phone]  Name: Tammy Sosa MRN: [id number] Date of Birth: [birth year]

## 2015-10-22 NOTE — Addendum Note (Signed)
Addended by: Ezekiel InaMANSFIELD, Maryanna Stuber S on: 10/22/2015 04:36 PM   Modules accepted: Orders

## 2015-10-26 ENCOUNTER — Ambulatory Visit: Payer: BLUE CROSS/BLUE SHIELD | Admitting: Physical Therapy

## 2015-10-28 ENCOUNTER — Ambulatory Visit: Payer: BLUE CROSS/BLUE SHIELD | Admitting: Physical Therapy

## 2015-10-29 ENCOUNTER — Ambulatory Visit: Payer: BLUE CROSS/BLUE SHIELD | Admitting: Physical Therapy

## 2015-11-02 ENCOUNTER — Other Ambulatory Visit: Payer: BLUE CROSS/BLUE SHIELD

## 2015-11-02 ENCOUNTER — Ambulatory Visit: Payer: BLUE CROSS/BLUE SHIELD | Admitting: Physical Therapy

## 2015-11-03 ENCOUNTER — Encounter: Admission: RE | Payer: Self-pay | Source: Ambulatory Visit

## 2015-11-03 ENCOUNTER — Ambulatory Visit
Admission: RE | Admit: 2015-11-03 | Payer: BLUE CROSS/BLUE SHIELD | Source: Ambulatory Visit | Admitting: Obstetrics & Gynecology

## 2015-11-03 SURGERY — DILATION AND EVACUATION, UTERUS
Anesthesia: Choice

## 2015-11-04 ENCOUNTER — Ambulatory Visit: Payer: BLUE CROSS/BLUE SHIELD | Admitting: Physical Therapy

## 2015-11-20 DIAGNOSIS — Z30430 Encounter for insertion of intrauterine contraceptive device: Secondary | ICD-10-CM | POA: Diagnosis not present

## 2015-11-25 ENCOUNTER — Ambulatory Visit: Payer: BLUE CROSS/BLUE SHIELD | Admitting: Anesthesiology

## 2015-11-25 ENCOUNTER — Ambulatory Visit
Admission: AD | Admit: 2015-11-25 | Discharge: 2015-11-25 | Disposition: A | Discharge status: Home / Self Care | Payer: BLUE CROSS/BLUE SHIELD | Source: Ambulatory Visit | Attending: Obstetrics & Gynecology | Admitting: Obstetrics & Gynecology

## 2015-11-25 ENCOUNTER — Encounter: Payer: Self-pay | Admitting: Anesthesiology

## 2015-11-25 ENCOUNTER — Encounter
Admission: AD | Disposition: A | Discharge status: Home / Self Care | Payer: Self-pay | Source: Ambulatory Visit | Attending: Obstetrics & Gynecology

## 2015-11-25 DIAGNOSIS — O039 Complete or unspecified spontaneous abortion without complication: Secondary | ICD-10-CM | POA: Diagnosis not present

## 2015-11-25 DIAGNOSIS — Z803 Family history of malignant neoplasm of breast: Secondary | ICD-10-CM | POA: Insufficient documentation

## 2015-11-25 DIAGNOSIS — N939 Abnormal uterine and vaginal bleeding, unspecified: Secondary | ICD-10-CM | POA: Insufficient documentation

## 2015-11-25 DIAGNOSIS — Z9889 Other specified postprocedural states: Secondary | ICD-10-CM | POA: Diagnosis not present

## 2015-11-25 DIAGNOSIS — O034 Incomplete spontaneous abortion without complication: Secondary | ICD-10-CM | POA: Diagnosis not present

## 2015-11-25 DIAGNOSIS — Z7982 Long term (current) use of aspirin: Secondary | ICD-10-CM | POA: Diagnosis not present

## 2015-11-25 DIAGNOSIS — J45909 Unspecified asthma, uncomplicated: Secondary | ICD-10-CM | POA: Insufficient documentation

## 2015-11-25 DIAGNOSIS — Z8673 Personal history of transient ischemic attack (TIA), and cerebral infarction without residual deficits: Secondary | ICD-10-CM | POA: Insufficient documentation

## 2015-11-25 DIAGNOSIS — N888 Other specified noninflammatory disorders of cervix uteri: Secondary | ICD-10-CM | POA: Diagnosis not present

## 2015-11-25 HISTORY — PX: INTRAUTERINE DEVICE (IUD) INSERTION: SHX5877

## 2015-11-25 HISTORY — DX: Cerebral infarction, unspecified: I63.9

## 2015-11-25 HISTORY — DX: Other specified postprocedural states: Z98.890

## 2015-11-25 LAB — BASIC METABOLIC PANEL
ANION GAP: 7 (ref 5–15)
BUN: 11 mg/dL (ref 6–20)
CALCIUM: 8.8 mg/dL — AB (ref 8.9–10.3)
CO2: 24 mmol/L (ref 22–32)
Chloride: 109 mmol/L (ref 101–111)
Creatinine, Ser: 0.71 mg/dL (ref 0.44–1.00)
Glucose, Bld: 102 mg/dL — ABNORMAL HIGH (ref 65–99)
Potassium: 3.7 mmol/L (ref 3.5–5.1)
SODIUM: 140 mmol/L (ref 135–145)

## 2015-11-25 LAB — CBC
HCT: 32.7 % — ABNORMAL LOW (ref 35.0–47.0)
Hemoglobin: 11.1 g/dL — ABNORMAL LOW (ref 12.0–16.0)
MCH: 29.2 pg (ref 26.0–34.0)
MCHC: 33.9 g/dL (ref 32.0–36.0)
MCV: 86.2 fL (ref 80.0–100.0)
PLATELETS: 248 10*3/uL (ref 150–440)
RBC: 3.79 MIL/uL — ABNORMAL LOW (ref 3.80–5.20)
RDW: 14.8 % — AB (ref 11.5–14.5)
WBC: 5.8 10*3/uL (ref 3.6–11.0)

## 2015-11-25 LAB — ABO/RH: ABO/RH(D): O POS

## 2015-11-25 LAB — TYPE AND SCREEN
ABO/RH(D): O POS
ANTIBODY SCREEN: NEGATIVE

## 2015-11-25 SURGERY — EXAM UNDER ANESTHESIA
Anesthesia: Monitor Anesthesia Care | Site: Cervix | Wound class: Clean Contaminated

## 2015-11-25 MED ORDER — DIPHENHYDRAMINE HCL 50 MG/ML IJ SOLN
INTRAMUSCULAR | Status: AC
  Filled 2015-11-25: qty 1

## 2015-11-25 MED ORDER — ONDANSETRON HCL 4 MG/2ML IJ SOLN
4.0000 mg | Freq: Once | INTRAMUSCULAR | Status: AC | PRN
  Administered 2015-11-25: 4 mg via INTRAVENOUS

## 2015-11-25 MED ORDER — DOXYCYCLINE HYCLATE 100 MG IV SOLR
200.0000 mg | Freq: Once | INTRAVENOUS | Status: AC
  Administered 2015-11-25: 200 mg via INTRAVENOUS
  Filled 2015-11-25: qty 200

## 2015-11-25 MED ORDER — FENTANYL CITRATE (PF) 100 MCG/2ML IJ SOLN
INTRAMUSCULAR | Status: AC
  Filled 2015-11-25: qty 2

## 2015-11-25 MED ORDER — DIPHENHYDRAMINE HCL 50 MG/ML IJ SOLN
25.0000 mg | Freq: Once | INTRAMUSCULAR | Status: AC
  Administered 2015-11-25: 25 mg via INTRAVENOUS

## 2015-11-25 MED ORDER — ONDANSETRON HCL 4 MG/2ML IJ SOLN
INTRAMUSCULAR | Status: AC
  Filled 2015-11-25: qty 2

## 2015-11-25 MED ORDER — METHYLERGONOVINE MALEATE 0.2 MG/ML IJ SOLN
INTRAMUSCULAR | Status: AC
  Administered 2015-11-25: 0.2 mg via INTRAMUSCULAR
  Filled 2015-11-25: qty 1

## 2015-11-25 MED ORDER — FENTANYL CITRATE (PF) 100 MCG/2ML IJ SOLN
25.0000 ug | INTRAMUSCULAR | Status: DC | PRN
  Administered 2015-11-25 (×4): 25 ug via INTRAVENOUS

## 2015-11-25 MED ORDER — PROPOFOL 10 MG/ML IV BOLUS
INTRAVENOUS | Status: DC | PRN
  Administered 2015-11-25: 40 mg via INTRAVENOUS
  Administered 2015-11-25: 20 mg via INTRAVENOUS
  Administered 2015-11-25: 30 mg via INTRAVENOUS

## 2015-11-25 MED ORDER — FENTANYL CITRATE (PF) 100 MCG/2ML IJ SOLN
INTRAMUSCULAR | Status: DC | PRN
  Administered 2015-11-25: 50 ug via INTRAVENOUS

## 2015-11-25 MED ORDER — LACTATED RINGERS IV SOLN
INTRAVENOUS | Status: DC
  Administered 2015-11-25: 14:00:00 via INTRAVENOUS

## 2015-11-25 MED ORDER — MIDAZOLAM HCL 2 MG/2ML IJ SOLN
INTRAMUSCULAR | Status: DC | PRN
  Administered 2015-11-25: 2 mg via INTRAVENOUS

## 2015-11-25 SURGICAL SUPPLY — 18 items
CUP MEDICINE 2OZ PLAST GRAD ST (MISCELLANEOUS) ×2 IMPLANT
DRAPE UNDER BUTTOCK W/FLU (DRAPES) ×2 IMPLANT
GLOVE BIO SURGEON STRL SZ 6.5 (GLOVE) ×6 IMPLANT
GOWN STRL REUS W/ TWL LRG LVL3 (GOWN DISPOSABLE) ×2 IMPLANT
GOWN STRL REUS W/TWL LRG LVL3 (GOWN DISPOSABLE) ×2
HEMOSTAT SURGICEL 2X3 (HEMOSTASIS) ×2 IMPLANT
KIT RM TURNOVER CYSTO AR (KITS) ×2 IMPLANT
LABEL OR SOLS (LABEL) IMPLANT
NS IRRIG 500ML POUR BTL (IV SOLUTION) ×2 IMPLANT
PACK DNC HYST (MISCELLANEOUS) ×2 IMPLANT
PAD OB MATERNITY 4.3X12.25 (PERSONAL CARE ITEMS) ×2 IMPLANT
PAD PREP 24X41 OB/GYN DISP (PERSONAL CARE ITEMS) ×2 IMPLANT
SOL PREP PVP 2OZ (MISCELLANEOUS)
SOLUTION PREP PVP 2OZ (MISCELLANEOUS) IMPLANT
SPONGE XRAY 4X4 16PLY STRL (MISCELLANEOUS) ×2 IMPLANT
SURGILUBE 2OZ TUBE FLIPTOP (MISCELLANEOUS) IMPLANT
SUT VICRYL+ 3-0 36IN CT-1 (SUTURE) ×2 IMPLANT
TOWEL OR 17X26 4PK STRL BLUE (TOWEL DISPOSABLE) IMPLANT

## 2015-11-25 NOTE — Op Note (Signed)
Operative Report Exam Under Anesthesia 11/25/2015  Patient:  Yoseline R Madilyn FiremanHayes  45 y.o. female Preoperative diagnosis:  CERVICAL BLEEDING Postoperative diagnosis:  CERVICAL BLEEDING  PROCEDURE:  Procedure(s): EXAM UNDER ANESTHESIA WITH SUTURE OF CERVIX (N/A) Surgeon:  Moishe SpiceSurgeon(s) and Role:    * Tijuan Dantes Salena Saner Brentney Goldbach, MD - Primary  Anesthesia:  sedation I/O: Total I/O In: 300 [I.V.:300] Out: - 30cc blood, no urine Specimens:  Cervical tissue Complications: None Apparent Disposition:  VS stable to PACU  Findings: Uterus, mobile, normal size, sounding to 8.5 cm; normal vagina, perineum. Cervix, dilated to 1cm, with what appeared to be blood clot protruding through the external os, once removed - brisk bleeding.      Indication for procedure/Consents: 45 y.o. [id number] s/p EAB 2-3 weeks ago due to recent stroke and high pregnancy risk.  She was following up with me in the office, and had an ultrasound to confirm evacuation of uterus.  At the same visit she was having a Mirena IUD placed, and when the os was seen the tissue was removed and brisk bleeding encountered.  Various hemostatic techniques and agents were used but bleeding remained brisk.  Patient is on 81mg  ASA daily for stroke prevention.  She was brought to the OR for exam under anesthesia for attempt to eliminate bleeding.  Written informed consent was obtained.    Procedure Details:   The patient was then taken to the operating room where sedatives were administered and was found to be adequate.  After a formal and adequate timeout was performed, she was placed in the dorsal lithotomy position and draped in the sterile manner. The vaginal packing was removed from the vagina, and betadine was used for antiseptic prep.   A speculum was then placed in the patient's vagina.   The Monsel's solution previously used was scraped away, and the IUD strings remained intact and visible.  The bleeding was lighter than in the office but still persistent.  A  3-0 vicryl was placed in a figure-of-eight to capture and ligate the cervical vessels at 9:00.  This was tied down.  Another 3-0 vicryl was placed to capture and ligate the vessels at 3:00, and were tied down.  After several minutes of observation, hemostasis was confirmed.  Surgicell was placed in the cervical os.  Again hemostasis was confirmed.  The patient tolerated the procedure well and was taken to the recovery area awake, extubated and in stable condition.  The patient will be discharged to home as per PACU criteria.  Routine postoperative instructions given. She will follow up in the clinic in two to four weeks for postoperative evaluation.  Ranae Plumberhelsea Ahnyla Mendel, MD [facility] Attending Gynecologist

## 2015-11-25 NOTE — Discharge Instructions (Signed)
You have my number - please let me know if you have any concerning symptoms.  - Dr. Elesa MassedWard.  You should expect to have some cramping and vaginal bleeding for about a week. This should taper off and subside, much like a period. If heavy bleeding continues or gets worse, you should contact the office for an earlier appointment.   Please call the office or physician on call for fever >101, severe pain, and heavy bleeding.   [phone]  NOTHING IN THE VAGINA FOR 2 WEEKS!!

## 2015-11-25 NOTE — H&P (Signed)
Pre-Operative History and Physical   SERVICE: Gynecology   Patient Name: Tammy Sosa Patient MRN:   [id number]  CC: cervical bleeding  HPI: Tammy Sosa is a 45 y.o. [id number] who is s/p EAB (medical) due to recent stroke in 08/2015 and significant risk for pregnancy.  [facility] today for a follow up ultrasound that confirmed absence of products in the uterus.  She had requested a Mirena, and as it had arrived, we decided to proceed with placement today.  Upon placement of the speculum there was noted to be a small blood clot in the external os of the cervix, and this was grasped with an alis clamp and teased out.  After doing this, there was noted to be brisk bleeding from the endocervix.  The Mirena was placed without difficulty, but the endocervix continued to briskly bleed.  Various tamponading mechanisms were tried and hemostatic agents were used (Monsels and Silver Nitrate sticks), but the bleeding persisted.  An attempt to place cervical stay-sutures to ligate the arteries was aborted due to inadequately sized suture needles.    Due to the persistent bleeding, the decision was made to take the patient to the OR for sedation and exam under anesthesia.  Consent was obtained and signed.  Risks were discussed.    She had a stroke in Jan 2017 and is on Aspirin 81mg  daily for anticoagulation.   Review of Systems: positives in bold GEN:   fevers, chills, weight changes, appetite changes, fatigue, night sweats HEENT:  HA, vision changes, hearing loss, congestion, rhinorrhea, sinus pressure, dysphagia CV:   CP, palpitations PULM:  SOB, cough GI:  abd pain, N/V/D/C GU:  dysuria, urgency, frequency MSK:  arthralgias, myalgias, back pain, swelling SKIN:  rashes, color changes, pallor NEURO:  numbness, weakness, tingling, seizures, dizziness, tremors PSYCH:  depression, anxiety, behavioral problems, confusion  HEME/LYMPH:  easy bruising or bleeding ENDO:   heat/cold intolerance  Past Obstetrical History: OB History    No data available      Past Gynecologic History: No LMP recorded.   Past Medical History: Past Medical History  Diagnosis Date  . Asthma Stroke, 2017     Past Surgical History:   Past Surgical History  Procedure Laterality Date  . Knee surgery    . Knee surgery    . Cervical conization w/bx      Family History:  family history includes Breast cancer (age of onset: 8955) in her mother.  Social History:  Social History   Social History  . Marital Status: Married    Spouse Name: N/A  . Number of Children: N/A  . Years of Education: N/A   Occupational History  . Not on file.   Social History Main Topics  . Smoking status: Never Smoker   . Smokeless tobacco: Not on file  . Alcohol Use: 0.6 oz/week    1 Glasses of wine, 0 Standard drinks or equivalent per week  . Drug Use: Not on file  . Sexual Activity: Not on file   Other Topics Concern  . Not on file   Social History Narrative    Home Medications:  Medications reconciled in EPIC  No current facility-administered medications on file prior to encounter.   Current Outpatient Prescriptions on File Prior to Encounter  Medication Sig Dispense Refill  . aspirin EC 325 MG EC tablet Take 1 tablet (325 mg total) by mouth daily. 30 tablet 0  . atorvastatin (LIPITOR) 40 MG tablet Take  1 tablet (40 mg total) by mouth daily at 6 PM. 30 tablet 0  . diclofenac sodium (VOLTAREN) 1 % GEL Apply 2 g topically 4 (four) times daily. Rub into affected area of foot 2 to 4 times daily 100 g 2    Allergies:  No Known Allergies  Physical Exam:  Temp:  [97.5 F (36.4 C)-98.5 F (36.9 C)] 97.5 F (36.4 C) (04/26 1446) Pulse Rate:  [50-65] 64 (04/26 1527) Resp:  [10-16] 16 (04/26 1527) BP: (90-108)/(41-63) 103/63 mmHg (04/26 1517) SpO2:  [95 %-100 %] 95 % (04/26 1527) Weight:  [78.472 kg (173 lb)] 78.472 kg (173 lb) (04/26 1324)   General Appearance:  Well  developed, well nourished, no acute distress, alert and oriented x3 HEENT:  Normocephalic atraumatic, extraocular movements intact, moist mucous membranes Cardiovascular:  Normal S1/S2, regular rate and rhythm, no murmurs Pulmonary:  clear to auscultation, no wheezes, rales or rhonchi, symmetric air entry, good air exchange Abdomen:  Bowel sounds present, soft, nontender, nondistended, no abnormal masses, no epigastric pain Extremities:  Full range of motion, no pedal edema, 2+ distal pulses, no tenderness Skin:  normal coloration and turgor, no rashes, no suspicious skin lesions noted  Neurologic:  Cranial nerves 2-12 grossly intact, normal muscle tone, strength 5/5 all four extremities Psychiatric:  Normal mood and affect, appropriate, no AH/VH Pelvic:  NEFG, no vulvar masses or lesions, normal vaginal mucosa, cervix without lesions or erythema, as described above, uterus, mobile, anteverted sounded to 8.5cm, no adnexal masses appreciated, no pelvic organ prolapse,    Labs/Studies:   CBC and Coags:  Pending  BMP:  Lab Results  Component Value Date   NA 140 11/25/2015   K 3.7 11/25/2015   CL 109 11/25/2015   CO2 24 11/25/2015   BUN 11 11/25/2015   CREATININE 0.71 11/25/2015   CREATININE 0.66 08/23/2015   CREATININE 0.79 08/22/2015   PROT 5.6* 08/23/2015   BILITOT 0.2* 08/23/2015   ALT 33 08/23/2015   AST 29 08/23/2015   ALKPHOS 55 08/23/2015     Assessment / Plan:   Tammy Sosa is a 45 y.o. with persistent endocervical bleeding after removal of tissue/blood clot at cervical os.  1. To OR for EXAM UNDER ANESTHESIA 2. CBC, BMP.  Urine pregnancy deferred due to clinical situation    ----- Ranae Plumber, MD Attending Obstetrician and Gynecologist [facility]

## 2015-11-25 NOTE — Transfer of Care (Signed)
Immediate Anesthesia Transfer of Care Note  Patient: Tammy Sosa  Procedure(s) Performed: Procedure(s): EXAM UNDER ANESTHESIA WITH SUTURE OF CERVIX (N/A)  Patient Location: PACU  Anesthesia Type:MAC  Level of Consciousness: awake, alert  and oriented  Airway & Oxygen Therapy: Patient Spontanous Breathing and Patient connected to nasal cannula oxygen  Post-op Assessment: Report given to RN and Post -op Vital signs reviewed and stable  Post vital signs: Reviewed and stable  Last Vitals:  Filed Vitals:   11/25/15 1324 11/25/15 1447  BP: 108/52 90/41  Pulse: 58 55  Temp: 36.9 C   Resp: 16 14    Last Pain:  Filed Vitals:   11/25/15 1449  PainSc: 3          Complications: No apparent anesthesia complications

## 2015-11-25 NOTE — Anesthesia Postprocedure Evaluation (Signed)
Anesthesia Post Note  Patient: Tammy Sosa  Procedure(s) Performed: Procedure(s) (LRB): EXAM UNDER ANESTHESIA WITH SUTURE OF CERVIX (N/A)  Patient location during evaluation: PACU Anesthesia Type: General Level of consciousness: awake and alert Pain management: pain level controlled Vital Signs Assessment: post-procedure vital signs reviewed and stable Respiratory status: spontaneous breathing, nonlabored ventilation, respiratory function stable and patient connected to nasal cannula oxygen Cardiovascular status: blood pressure returned to baseline and stable Postop Assessment: no signs of nausea or vomiting Anesthetic complications: no    Last Vitals:  Filed Vitals:   11/25/15 1547 11/25/15 1600  BP: 109/59 105/60  Pulse: 53 50  Temp: 35.9 C   Resp: 16     Last Pain:  Filed Vitals:   11/25/15 1611  PainSc: 2                  Raniah Karan S

## 2015-11-25 NOTE — OR Nursing (Signed)
Dr Elesa MassedWard spoke with Dr Maisie Fushomas about not obtaining pregnancy test.  I also verified no pregnancy test with Dr Maisie Fushomas.

## 2015-11-25 NOTE — Anesthesia Preprocedure Evaluation (Addendum)
Anesthesia Evaluation  Patient identified by MRN, date of birth, ID band Patient awake    Reviewed: Allergy & Precautions, NPO status , Patient's Chart, lab work & pertinent test results, reviewed documented beta blocker date and time   Airway Mallampati: II  TM Distance: >3 FB     Dental  (+) Chipped   Pulmonary asthma ,           Cardiovascular      Neuro/Psych  Headaches, CVA, No Residual Symptoms    GI/Hepatic   Endo/Other    Renal/GU      Musculoskeletal   Abdominal   Peds  Hematology   Anesthesia Other Findings Considering her present situation, Dr.  Elesa MassedWard does not feel she needs a U-preg test.  Reproductive/Obstetrics                            Anesthesia Physical Anesthesia Plan  ASA: III  Anesthesia Plan: MAC   Post-op Pain Management:    Induction: Intravenous  Airway Management Planned:   Additional Equipment:   Intra-op Plan:   Post-operative Plan:   Informed Consent: I have reviewed the patients History and Physical, chart, labs and discussed the procedure including the risks, benefits and alternatives for the proposed anesthesia with the patient or authorized representative who has indicated his/her understanding and acceptance.     Plan Discussed with: CRNA  Anesthesia Plan Comments:        Anesthesia Quick Evaluation

## 2015-11-27 LAB — SURGICAL PATHOLOGY

## 2015-11-30 DIAGNOSIS — I69398 Other sequelae of cerebral infarction: Secondary | ICD-10-CM | POA: Diagnosis not present

## 2015-11-30 DIAGNOSIS — R2689 Other abnormalities of gait and mobility: Secondary | ICD-10-CM | POA: Diagnosis not present

## 2015-12-01 DIAGNOSIS — O034 Incomplete spontaneous abortion without complication: Secondary | ICD-10-CM | POA: Diagnosis not present

## 2015-12-17 DIAGNOSIS — F329 Major depressive disorder, single episode, unspecified: Secondary | ICD-10-CM | POA: Diagnosis not present

## 2015-12-17 DIAGNOSIS — N879 Dysplasia of cervix uteri, unspecified: Secondary | ICD-10-CM | POA: Diagnosis not present

## 2015-12-17 DIAGNOSIS — E663 Overweight: Secondary | ICD-10-CM | POA: Diagnosis not present

## 2015-12-17 DIAGNOSIS — F419 Anxiety disorder, unspecified: Secondary | ICD-10-CM | POA: Diagnosis not present

## 2015-12-29 DIAGNOSIS — Q211 Atrial septal defect: Secondary | ICD-10-CM | POA: Diagnosis not present

## 2015-12-30 DIAGNOSIS — Z30431 Encounter for routine checking of intrauterine contraceptive device: Secondary | ICD-10-CM | POA: Diagnosis not present

## 2016-01-05 DIAGNOSIS — R55 Syncope and collapse: Secondary | ICD-10-CM | POA: Diagnosis not present

## 2016-01-27 DIAGNOSIS — F419 Anxiety disorder, unspecified: Secondary | ICD-10-CM | POA: Diagnosis not present

## 2016-01-27 DIAGNOSIS — F329 Major depressive disorder, single episode, unspecified: Secondary | ICD-10-CM | POA: Diagnosis not present

## 2016-02-03 DIAGNOSIS — R55 Syncope and collapse: Secondary | ICD-10-CM | POA: Diagnosis not present

## 2016-03-09 DIAGNOSIS — E785 Hyperlipidemia, unspecified: Secondary | ICD-10-CM | POA: Diagnosis not present

## 2016-03-09 DIAGNOSIS — F329 Major depressive disorder, single episode, unspecified: Secondary | ICD-10-CM | POA: Diagnosis not present

## 2016-03-09 DIAGNOSIS — I639 Cerebral infarction, unspecified: Secondary | ICD-10-CM | POA: Diagnosis not present

## 2016-03-09 DIAGNOSIS — F419 Anxiety disorder, unspecified: Secondary | ICD-10-CM | POA: Diagnosis not present

## 2016-03-09 DIAGNOSIS — Z113 Encounter for screening for infections with a predominantly sexual mode of transmission: Secondary | ICD-10-CM | POA: Diagnosis not present

## 2016-03-09 DIAGNOSIS — Z0000 Encounter for general adult medical examination without abnormal findings: Secondary | ICD-10-CM | POA: Diagnosis not present

## 2016-05-14 DIAGNOSIS — Z23 Encounter for immunization: Secondary | ICD-10-CM | POA: Diagnosis not present

## 2016-06-01 DIAGNOSIS — E6609 Other obesity due to excess calories: Secondary | ICD-10-CM | POA: Diagnosis not present

## 2016-06-01 DIAGNOSIS — R413 Other amnesia: Secondary | ICD-10-CM | POA: Diagnosis not present

## 2016-06-01 DIAGNOSIS — I6930 Unspecified sequelae of cerebral infarction: Secondary | ICD-10-CM | POA: Diagnosis not present

## 2016-06-01 DIAGNOSIS — I69398 Other sequelae of cerebral infarction: Secondary | ICD-10-CM | POA: Diagnosis not present

## 2016-06-02 DIAGNOSIS — H524 Presbyopia: Secondary | ICD-10-CM | POA: Diagnosis not present

## 2016-06-06 DIAGNOSIS — I638 Other cerebral infarction: Secondary | ICD-10-CM | POA: Diagnosis not present

## 2016-06-06 DIAGNOSIS — R0602 Shortness of breath: Secondary | ICD-10-CM | POA: Diagnosis not present

## 2016-06-06 DIAGNOSIS — Q211 Atrial septal defect: Secondary | ICD-10-CM | POA: Diagnosis not present

## 2016-06-22 ENCOUNTER — Emergency Department
Admission: EM | Admit: 2016-06-22 | Discharge: 2016-06-22 | Disposition: A | Discharge status: Home / Self Care | Payer: BLUE CROSS/BLUE SHIELD | Attending: Emergency Medicine | Admitting: Emergency Medicine

## 2016-06-22 ENCOUNTER — Emergency Department: Payer: BLUE CROSS/BLUE SHIELD

## 2016-06-22 ENCOUNTER — Encounter: Payer: Self-pay | Admitting: Emergency Medicine

## 2016-06-22 DIAGNOSIS — W2201XA Walked into wall, initial encounter: Secondary | ICD-10-CM | POA: Diagnosis not present

## 2016-06-22 DIAGNOSIS — S60221A Contusion of right hand, initial encounter: Secondary | ICD-10-CM | POA: Insufficient documentation

## 2016-06-22 DIAGNOSIS — J45909 Unspecified asthma, uncomplicated: Secondary | ICD-10-CM | POA: Insufficient documentation

## 2016-06-22 DIAGNOSIS — Z79899 Other long term (current) drug therapy: Secondary | ICD-10-CM | POA: Insufficient documentation

## 2016-06-22 DIAGNOSIS — M79642 Pain in left hand: Secondary | ICD-10-CM | POA: Diagnosis not present

## 2016-06-22 DIAGNOSIS — Y929 Unspecified place or not applicable: Secondary | ICD-10-CM | POA: Insufficient documentation

## 2016-06-22 DIAGNOSIS — E785 Hyperlipidemia, unspecified: Secondary | ICD-10-CM | POA: Insufficient documentation

## 2016-06-22 DIAGNOSIS — S60222A Contusion of left hand, initial encounter: Secondary | ICD-10-CM | POA: Diagnosis not present

## 2016-06-22 DIAGNOSIS — Y999 Unspecified external cause status: Secondary | ICD-10-CM | POA: Diagnosis not present

## 2016-06-22 DIAGNOSIS — M79641 Pain in right hand: Secondary | ICD-10-CM | POA: Diagnosis not present

## 2016-06-22 DIAGNOSIS — Y939 Activity, unspecified: Secondary | ICD-10-CM | POA: Diagnosis not present

## 2016-06-22 DIAGNOSIS — M7989 Other specified soft tissue disorders: Secondary | ICD-10-CM | POA: Diagnosis not present

## 2016-06-22 DIAGNOSIS — S6991XA Unspecified injury of right wrist, hand and finger(s), initial encounter: Secondary | ICD-10-CM | POA: Diagnosis present

## 2016-06-22 DIAGNOSIS — Z7982 Long term (current) use of aspirin: Secondary | ICD-10-CM | POA: Insufficient documentation

## 2016-06-22 MED ORDER — OXYCODONE-ACETAMINOPHEN 7.5-325 MG PO TABS: 1.0000 | ORAL_TABLET | Freq: Four times a day (QID) | ORAL | 0 refills | Status: DC | PRN

## 2016-06-22 MED ORDER — OXYCODONE-ACETAMINOPHEN 5-325 MG PO TABS
2.0000 | ORAL_TABLET | Freq: Once | ORAL | Status: AC
  Administered 2016-06-22: 2 via ORAL
  Filled 2016-06-22: qty 2

## 2016-06-22 NOTE — Discharge Instructions (Signed)
Wear splint for 3-5 days as needed. °

## 2016-06-22 NOTE — ED Notes (Signed)
Pt presents with injuries to both hands after hitting a wall approximately 90 minutes ago. Pt is icing both hands. Limited movement in right hand, swelling in left hand as well. Pt alert & oriented with NAD noted.

## 2016-06-22 NOTE — ED Triage Notes (Signed)
Pt punched wall with both hands. Swelling/bruising noted.

## 2016-06-22 NOTE — ED Notes (Signed)
Pt discharged home after verbalizing understanding of discharge instructions; nad noted. 

## 2016-06-22 NOTE — ED Provider Notes (Signed)
[facility] Emergency Department Provider Note   ____________________________________________   First MD Initiated Contact with Patient 06/22/16 1031     (approximate)  I have reviewed the triage vital signs and the nursing notes.   HISTORY  Chief Complaint Hand Pain    HPI Tammy Sosa is a 45 y.o. female patient complaining of bilateral hand pain after striking a wall approximately hour and a half prior to arrival. Patient applied ice to both hands out to arrival. Limited range of motion with flexion extension of the phalange. Patient stated right hand hurts worst in the left.Patient is right-hand dominant. Patient weight pain as 8/10. Patient described a pain as "sharp and achy". Patient is status post CVA tenderness ago resulting in some residual mild right sided weakness. Patient only takes 81 mg of aspirin.   Past Medical History:  Diagnosis Date  . Asthma   . H/O arthroscopy of knee   . History of conization of cervix   . Stroke [facility](H[facility])     Patient Active Problem List   Diagnosis Date Noted  . CVA (cerebral infarction) 08/23/2015  . Right sided weakness 08/22/2015  . Headache 08/22/2015    Past Surgical History:  Procedure Laterality Date  . CERVICAL CONIZATION W/BX    . KNEE SURGERY    . KNEE SURGERY      Prior to Admission medications   Medication Sig Start Date End Date Taking? Authorizing Provider  aspirin EC 325 MG EC tablet Take 1 tablet (325 mg total) by mouth daily. 08/25/15   Delfino LovettVipul Shah, MD  atorvastatin (LIPITOR) 40 MG tablet Take 1 tablet (40 mg total) by mouth daily at 6 PM. 08/25/15   Delfino LovettVipul Shah, MD  diclofenac sodium (VOLTAREN) 1 % GEL Apply 2 g topically 4 (four) times daily. Rub into affected area of foot 2 to 4 times daily 01/01/15   Vivi BarrackMatthew R Wagoner, DPM  oxyCODONE-acetaminophen (PERCOCET) 7.5-325 MG tablet Take 1 tablet by mouth every 6 (six) hours as needed for severe pain. 06/22/16   Joni Reiningonald K Smith, PA-C     Allergies Patient has no known allergies.  Family History  Problem Relation Age of Onset  . Breast cancer Mother 1555    Social History Social History  Substance Use Topics  . Smoking status: Never Smoker  . Smokeless tobacco: Not on file  . Alcohol use 0.6 oz/week    1 Glasses of wine per week    Review of Systems Constitutional: No fever/chills Eyes: No visual changes. ENT: No sore throat. Cardiovascular: Denies chest pain. Respiratory: Denies shortness of breath. Gastrointestinal: No abdominal pain.  No nausea, no vomiting.  No diarrhea.  No constipation. Genitourinary: Negative for dysuria. Musculoskeletal: Negative for back pain. Skin: Negative for rash. Neurological: Negative for headaches, focal weakness or numbness. Endocrine:Hyperlipidemia  ____________________________________________   PHYSICAL EXAM:  VITAL SIGNS: ED Triage Vitals [06/22/16 1006]  Enc Vitals Group     BP 120/66     Pulse Rate 87     Resp 18     Temp 98.3 F (36.8 C)     Temp Source Oral     SpO2 98 %     Weight 185 lb (83.9 kg)     Height 5\' 4"  (1.626 m)     Head Circumference      Peak Flow      Pain Score 8     Pain Loc      Pain Edu?  Excl. in GC?     Constitutional: Alert and oriented. Well appearing and in no acute distress. Eyes: Conjunctivae are normal. PERRL. EOMI. Head: Atraumatic. Nose: No congestion/rhinnorhea. Mouth/Throat: Mucous membranes are moist.  Oropharynx non-erythematous. Neck: No stridor.  No cervical spine tenderness to palpation. Hematological/Lymphatic/Immunilogical: No cervical lymphadenopathy. Cardiovascular: Normal rate, regular rhythm. Grossly normal heart sounds.  Good peripheral circulation. Respiratory: Normal respiratory effort.  No retractions. Lungs CTAB. Gastrointestinal: Soft and nontender. No distention. No abdominal bruits. No CVA tenderness. Musculoskeletal: No lower extremity tenderness nor edema.  No joint  effusions. Neurologic:  Normal speech and language. No gross focal neurologic deficits are appreciated. No gait instability. Skin:  Skin is warm, dry and intact. No rash noted.Ecchymosis and abrasion dorsal aspect bilateral hand Psychiatric: Mood and affect are normal. Speech and behavior are normal.  ____________________________________________   LABS (all labs ordered are listed, but only abnormal results are displayed)  Labs Reviewed - No data to display ____________________________________________  EKG   ____________________________________________  RADIOLOGY  No acute findings x-ray of bilateral hand. ____________________________________________   PROCEDURES  Procedure(s) performed: None  Procedures  Critical Care performed: No  ____________________________________________   INITIAL IMPRESSION / ASSESSMENT AND PLAN / ED COURSE  Pertinent labs & imaging results that were available during my care of the patient were reviewed by me and considered in my medical decision making (see chart for details).  Bilateral hand contusion and abrasions. Patient given discharge care instructions. Patient given a prescription for Percocet. Advised follow-up family doctor condition persists.  Clinical Course   Discussed x-ray finding with patient.   ____________________________________________   FINAL CLINICAL IMPRESSION(S) / ED DIAGNOSES  Final diagnoses:  Contusion of right hand, initial encounter  Contusion of left hand, initial encounter      NEW MEDICATIONS STARTED DURING THIS VISIT:  New Prescriptions   OXYCODONE-ACETAMINOPHEN (PERCOCET) 7.5-325 MG TABLET    Take 1 tablet by mouth every 6 (six) hours as needed for severe pain.     Note:  This document was prepared using Dragon voice recognition software and may include unintentional dictation errors.    Joni ReiningRonald K Smith, PA-C 06/22/16 1059    Jennye MoccasinBrian S Quigley, MD 06/22/16 1120

## 2016-07-11 ENCOUNTER — Other Ambulatory Visit: Payer: Self-pay | Admitting: Internal Medicine

## 2016-07-11 DIAGNOSIS — Z1231 Encounter for screening mammogram for malignant neoplasm of breast: Secondary | ICD-10-CM

## 2016-08-16 ENCOUNTER — Encounter: Payer: Self-pay | Admitting: Radiology

## 2016-08-16 ENCOUNTER — Ambulatory Visit
Admission: RE | Admit: 2016-08-16 | Discharge: 2016-08-16 | Disposition: A | Discharge status: Home / Self Care | Payer: BLUE CROSS/BLUE SHIELD | Source: Ambulatory Visit | Attending: Internal Medicine | Admitting: Internal Medicine

## 2016-08-16 DIAGNOSIS — Z1231 Encounter for screening mammogram for malignant neoplasm of breast: Secondary | ICD-10-CM | POA: Diagnosis not present

## 2016-11-04 DIAGNOSIS — Q211 Atrial septal defect: Secondary | ICD-10-CM | POA: Diagnosis not present

## 2016-11-04 DIAGNOSIS — R55 Syncope and collapse: Secondary | ICD-10-CM | POA: Diagnosis not present

## 2016-11-04 DIAGNOSIS — R079 Chest pain, unspecified: Secondary | ICD-10-CM | POA: Diagnosis not present

## 2016-12-17 DIAGNOSIS — J019 Acute sinusitis, unspecified: Secondary | ICD-10-CM | POA: Diagnosis not present

## 2016-12-17 DIAGNOSIS — J029 Acute pharyngitis, unspecified: Secondary | ICD-10-CM | POA: Diagnosis not present

## 2016-12-27 DIAGNOSIS — I639 Cerebral infarction, unspecified: Secondary | ICD-10-CM | POA: Diagnosis not present

## 2016-12-27 DIAGNOSIS — Q211 Atrial septal defect: Secondary | ICD-10-CM | POA: Diagnosis not present

## 2017-01-06 DIAGNOSIS — R5383 Other fatigue: Secondary | ICD-10-CM | POA: Diagnosis not present

## 2017-01-06 DIAGNOSIS — R109 Unspecified abdominal pain: Secondary | ICD-10-CM | POA: Diagnosis not present

## 2017-01-06 DIAGNOSIS — Z13220 Encounter for screening for lipoid disorders: Secondary | ICD-10-CM | POA: Diagnosis not present

## 2017-01-06 DIAGNOSIS — F329 Major depressive disorder, single episode, unspecified: Secondary | ICD-10-CM | POA: Diagnosis not present

## 2017-01-06 DIAGNOSIS — E049 Nontoxic goiter, unspecified: Secondary | ICD-10-CM | POA: Diagnosis not present

## 2017-01-06 DIAGNOSIS — R609 Edema, unspecified: Secondary | ICD-10-CM | POA: Diagnosis not present

## 2017-01-16 DIAGNOSIS — H20012 Primary iridocyclitis, left eye: Secondary | ICD-10-CM | POA: Diagnosis not present

## 2017-01-26 ENCOUNTER — Ambulatory Visit (INDEPENDENT_AMBULATORY_CARE_PROVIDER_SITE_OTHER): Payer: BLUE CROSS/BLUE SHIELD | Admitting: Advanced Practice Midwife

## 2017-01-26 ENCOUNTER — Encounter: Payer: Self-pay | Admitting: Advanced Practice Midwife

## 2017-01-26 VITALS — BP 124/78 | Ht 64.0 in | Wt 194.0 lb

## 2017-01-26 DIAGNOSIS — Z124 Encounter for screening for malignant neoplasm of cervix: Secondary | ICD-10-CM | POA: Diagnosis not present

## 2017-01-26 DIAGNOSIS — Z01419 Encounter for gynecological examination (general) (routine) without abnormal findings: Secondary | ICD-10-CM | POA: Diagnosis not present

## 2017-01-26 NOTE — Progress Notes (Signed)
Tammy Sosa ID: Tammy Sosa, female   DOB: 1971-02-20, 46 y.o.   MRN: [id number]     Gynecology Annual Exam  PCP: Tammy Sosa, No Pcp Per  Chief Complaint:  Chief Complaint  Tammy Sosa presents with  . Annual Exam    History of Present Illness: Tammy Sosa is a 46 y.o. [id number] presents for annual exam. The Tammy Sosa has complaints today of excess weight gain, hair loss, fatigue and feeling cold. Her thyroid was checked at recent visit with PCP and found to be wnl. She also has complaint of ovarian tenderness just prior to this month's cycle.    LMP: Tammy Sosa's last menstrual period was 01/16/2017. Average Interval: regular, 28 days Duration of flow: 7 days Heavy Menses: no Clots: no Intermenstrual Bleeding: no Postcoital Bleeding: no Dysmenorrhea: no   The Tammy Sosa is sexually active. She currently uses Mirena IUD for contraception. She denies dyspareunia.  The Tammy Sosa does occasionally perform self breast exams.  There is notable family history of breast or ovarian cancer in her family. The Tammy Sosa's mother had breast and ovarian cancer. The Tammy Sosa declines genetic testing.   The Tammy Sosa wears seatbelts: yes.   The Tammy Sosa has regular exercise: yes.  She admits to generally healthy diet although she probably does not get enough healthy fats. She admits to good support from friends and family.   The Tammy Sosa denies current symptoms of depression.    Review of Systems: Review of Systems  Constitutional: Negative.   HENT: Negative.   Eyes: Negative.   Respiratory: Negative.   Cardiovascular: Negative.   Gastrointestinal: Negative.   Genitourinary: Negative.   Musculoskeletal: Negative.   Skin: Negative.   Neurological: Negative.   Endo/Heme/Allergies: Negative.   Psychiatric/Behavioral: Negative.     Past Medical History:  Past Medical History:  Diagnosis Date  . Abnormal Papanicolaou smear of cervix with positive human papilloma virus (HPV) test 12/11/2014   LSIL/Mild dysplasia    . Asthma   . Atypical squamous cells of undetermined significance (ASCUS) on Papanicolaou smear of cervix 01/30/2013  . Family history of breast cancer in female   . Family history of ovarian cancer   . H/O arthroscopy of knee   . Herniated cervical disc   . History of conization of cervix   . History of gestational diabetes   . 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 Plantar fasciitis   . Stroke [facility])     Past Surgical History:  Past Surgical History:  Procedure Laterality Date  . CERVICAL CONIZATION W/BX    . KNEE SURGERY    . KNEE SURGERY      Gynecologic History:  Tammy Sosa's last menstrual period was 01/16/2017. Contraception: IUD Last Pap: Results were: 2 years ago LSIL with positive HR HPV  Last mammogram: January 2018 Results were: [facility] Obstetric History: [id number]  Family History:  Family History  Problem Relation Age of Onset  . Breast cancer Mother 66  . Ovarian cancer Mother 73  . Skin cancer Father   . Stomach cancer Paternal Uncle   . Stomach cancer Maternal Grandfather     Social History:  Social History   Social History  . Marital status: Married    Spouse name: N/A  . Number of children: N/A  . Years of education: N/A   Occupational History  . Not on file.   Social History Main Topics  . Smoking status: Never Smoker  . Smokeless tobacco: Never Used  . Alcohol use 0.6 oz/week    1  Glasses of wine per week  . Drug use: Unknown  . Sexual activity: Yes    Birth control/ protection: IUD   Other Topics Concern  . Not on file   Social History Narrative  . No narrative on file    Allergies:  No Known Allergies  Medications: Prior to Admission medications   Medication Sig Start Date End Date Taking? Authorizing Provider  atorvastatin (LIPITOR) 40 MG tablet Take 1 tablet (40 mg total) by mouth daily at 6 PM. 08/25/15  Yes Delfino Lovett, MD  levonorgestrel (MIRENA) 20 MCG/24HR IUD 1 each by Intrauterine route once.   Yes [provider]  aspirin EC 81 MG tablet Take 81 mg by mouth.    [provider]  diclofenac sodium (VOLTAREN) 1 % GEL Apply 2 g topically 4 (four) times daily. Rub into affected area of foot 2 to 4 times daily Tammy Sosa not taking: Reported on 01/26/2017 01/01/15   Vivi Barrack, DPM  furosemide (LASIX) 40 MG tablet TAKE 1 TABLET BY MOUTH EVERY DAY IN THE MORNING 01/09/17   [provider]  ibuprofen (ADVIL,MOTRIN) 800 MG tablet Take 800 mg by mouth. 11/09/15   [provider]    Physical Exam Vitals: Blood pressure 124/78, height 5\' 4"  (1.626 m), weight 194 lb (88 kg), last menstrual period 01/16/2017.  General: NAD HEENT: normocephalic, anicteric Thyroid: no enlargement, no palpable nodules Pulmonary: No increased work of breathing, CTAB Cardiovascular: RRR, distal pulses 2+ Breast: Breast symmetrical, no tenderness, no palpable nodules or masses, no skin or nipple retraction present, no nipple discharge.  No axillary or supraclavicular lymphadenopathy. Abdomen: NABS, soft, non-tender, non-distended.  Umbilicus without lesions.  No hepatomegaly, splenomegaly or masses palpable. No evidence of hernia  Genitourinary:  External: Normal external female genitalia.  Normal urethral meatus, normal  Bartholin's and Skene's glands.    Vagina: Normal vaginal mucosa, no evidence of prolapse.    Cervix: Grossly normal in appearance, no bleeding, no CMT  Uterus: Non-enlarged, mobile, normal contour.    Adnexa: ovaries non-enlarged, no adnexal masses, non-tender  Rectal: deferred  Lymphatic: no evidence of inguinal lymphadenopathy Extremities: no edema, erythema, or tenderness Neurologic: Grossly intact Psychiatric: mood appropriate, affect full   Assessment: 46 y.o. [id number] Well woman exam with PAP smear  Plan: Problem List Items Addressed This Visit    None    Visit Diagnoses    Well woman exam with routine gynecological exam    -  Primary   Relevant Orders    IGP, Aptima HPV, rfx 16/18,45   Cervical cancer screening       Relevant Orders   IGP, Aptima HPV, rfx 16/18,45      1) Mammogram - recommend yearly screening mammogram.  Mammogram Is up to date  2) STI screening was offered and declined  3) ASCCP guidelines and rational discussed.  Tammy Sosa opts for yearly screening interval  4) Contraception - Continue with Mirena IUD for 4 more years  5) Colonoscopy -- Screening recommended starting at age 8 for average risk individuals, age 72 for individuals deemed at increased risk (including African Americans) and recommended to continue until age 31.  For Tammy Sosa age 37-85 individualized approach is recommended.  Gold standard screening is via colonoscopy, Cologuard screening is an acceptable alternative for Tammy Sosa unwilling or unable to undergo colonoscopy.  "Colorectal cancer screening for average?risk adults: 2018 guideline update from the American Cancer Society"CA: A Cancer Journal for Clinicians: Dec 28, 2016.   6) Routine healthcare maintenance including  cholesterol, diabetes screening discussed managed by PCP   7) Increase dietary healthy fats, continue other healthy lifestyle diet and exercise  8) Return to clinic in 1 year for annual exam   Tresea MallJane Aceyn Kathol, CNM

## 2017-01-31 LAB — IGP, APTIMA HPV, RFX 16/18,45
HPV APTIMA: NEGATIVE
PAP SMEAR COMMENT: 0

## 2017-06-02 IMAGING — MG MM DIGITAL SCREENING BILAT W/ TOMO W/ CAD
8 of 12 series · 8 of 28 positions shown · non-contrast
Comparison: None.

CLINICAL DATA: Screening.

EXAM:
2D DIGITAL SCREENING BILATERAL MAMMOGRAM WITH CAD AND ADJUNCT TOMO

[L MLO]
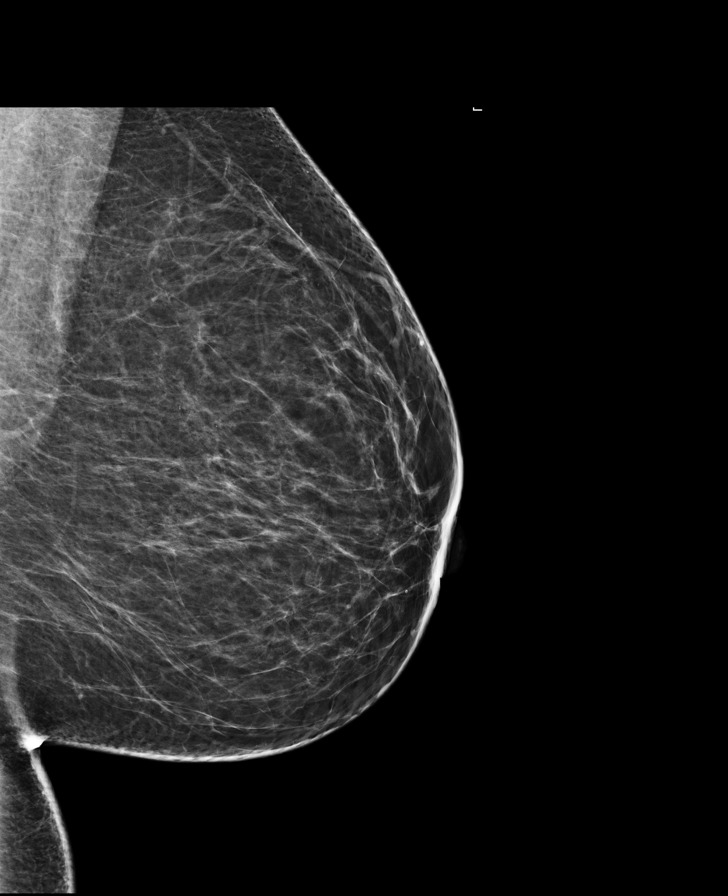

[R MLO]
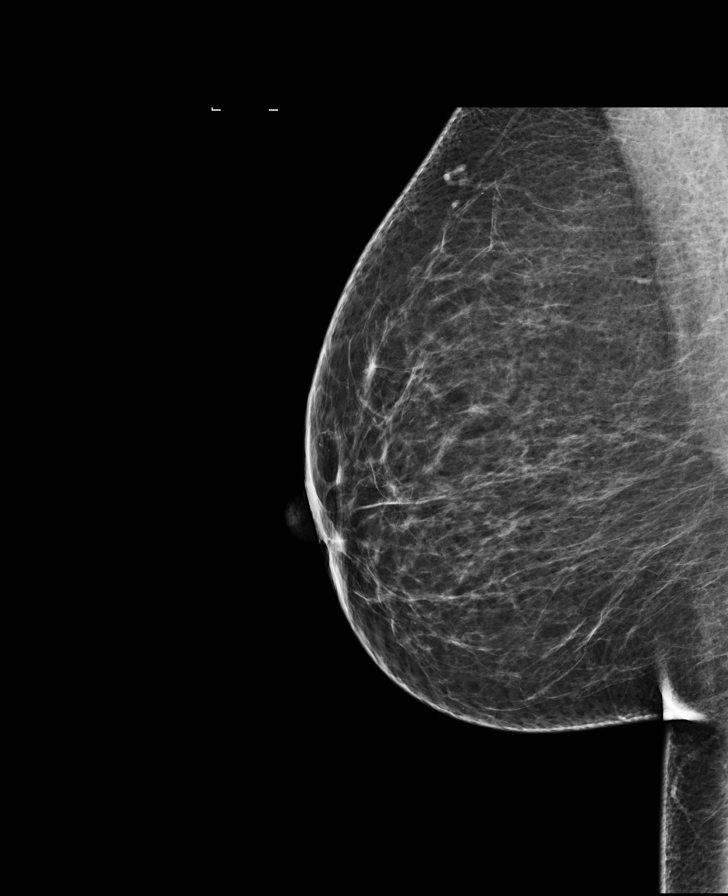

[R CC synth-2D]
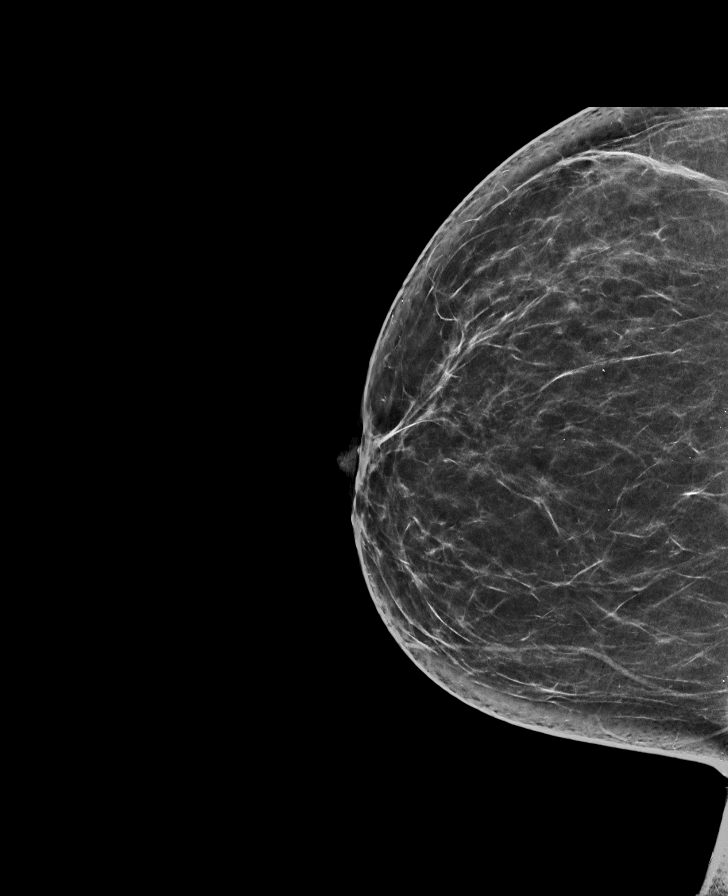

[L CC]
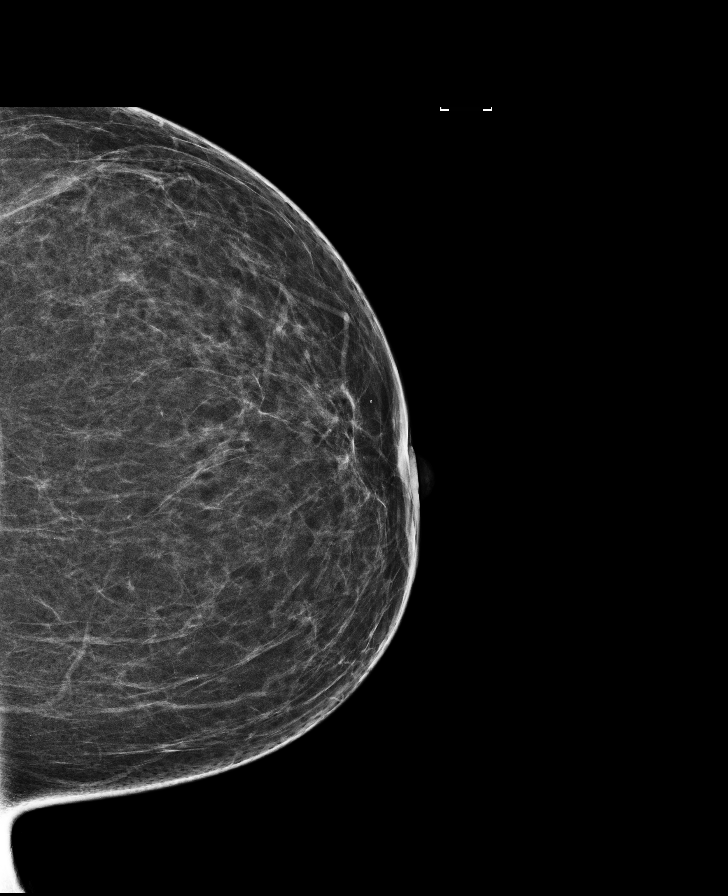

[L MLO synth-2D]
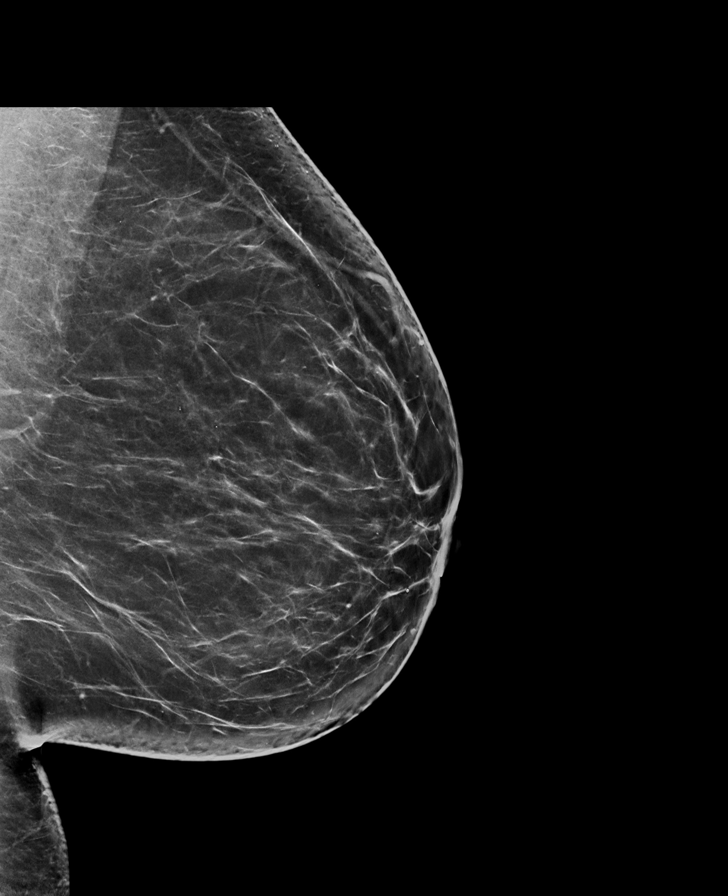

[R CC]
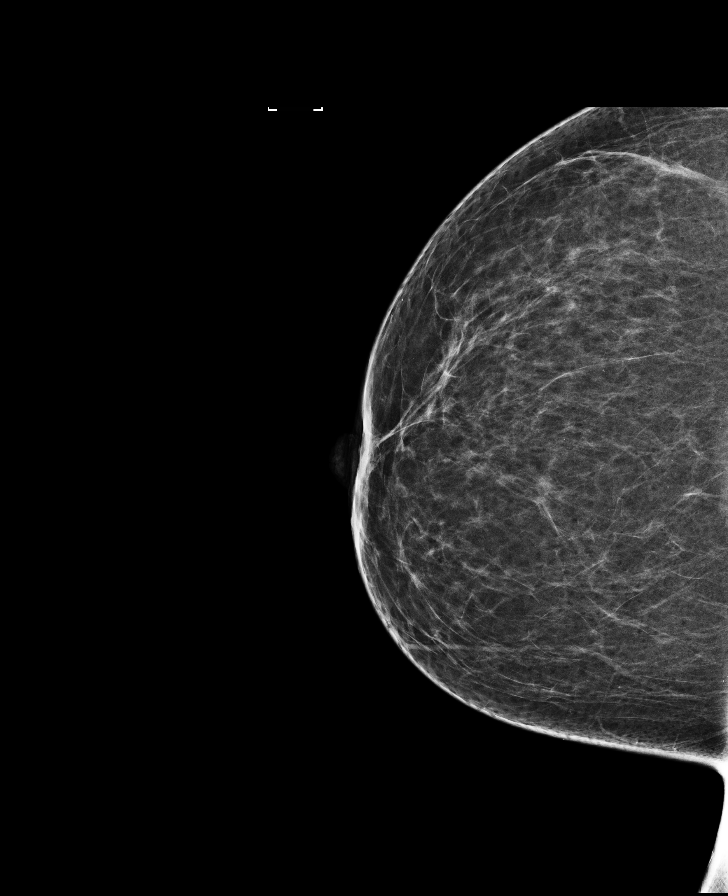

[L CC synth-2D]
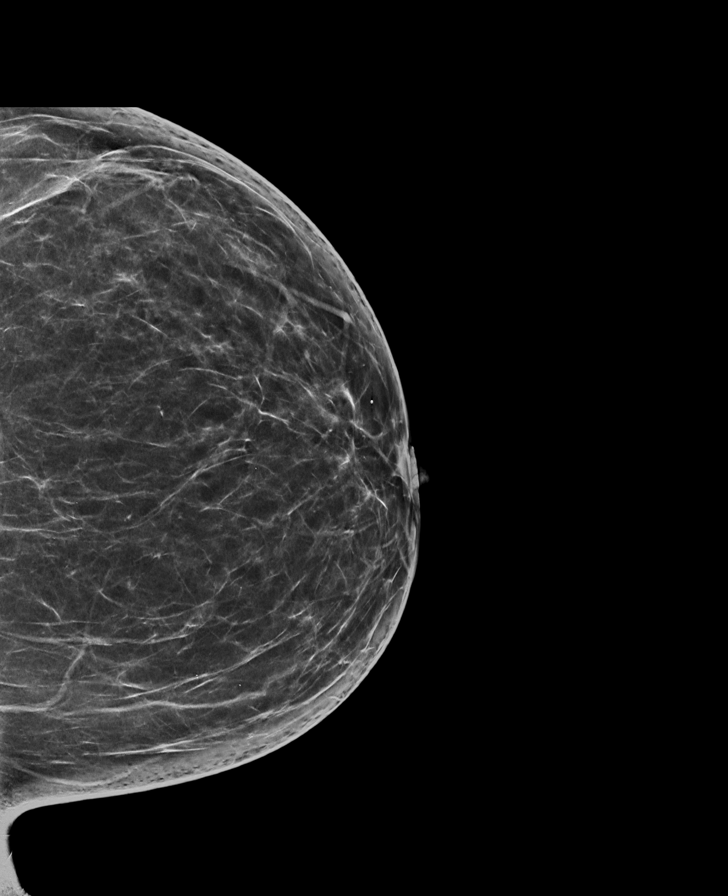

[R MLO synth-2D]
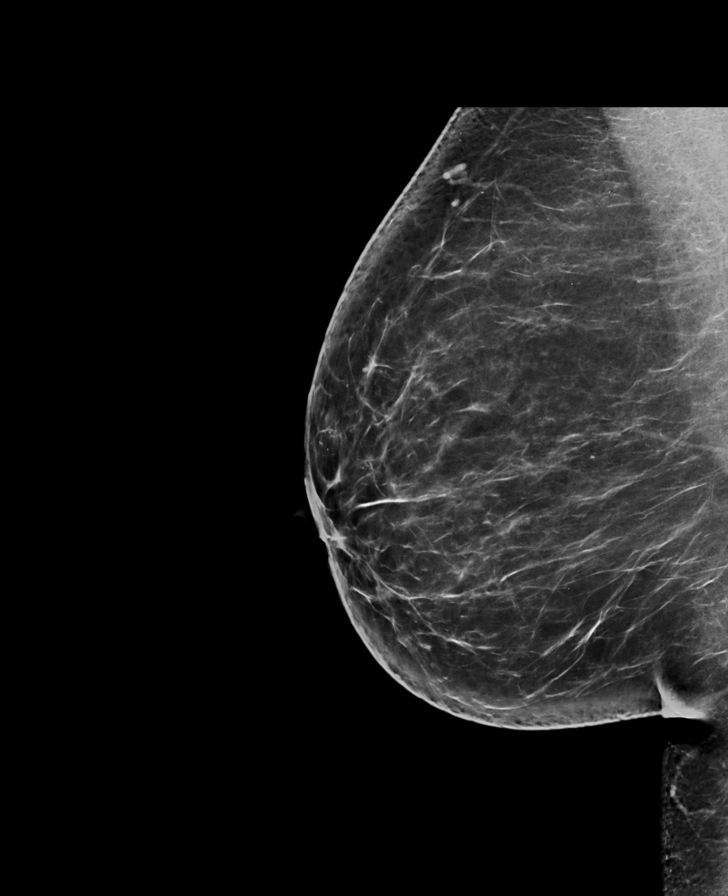

[8 of 28 positions shown; findings below may reference images not displayed]

ACR Breast Density Category b: There are scattered areas of
fibroglandular density.
FINDINGS: There are no findings suspicious for malignancy. Images were
processed with CAD.
IMPRESSION: No mammographic evidence of malignancy. A result letter of this
screening mammogram will be mailed directly to the patient.

RECOMMENDATION:
Screening mammogram in one year. (Code:[id number]-M-3AZ)

BI-RADS CATEGORY  1: Negative.

## 2017-08-25 ENCOUNTER — Ambulatory Visit (INDEPENDENT_AMBULATORY_CARE_PROVIDER_SITE_OTHER): Payer: BLUE CROSS/BLUE SHIELD | Admitting: Physician Assistant

## 2017-08-25 ENCOUNTER — Encounter: Payer: Self-pay | Admitting: Physician Assistant

## 2017-08-25 VITALS — BP 114/72 | HR 76 | Temp 97.8°F | Resp 16 | Ht 64.0 in | Wt 193.0 lb

## 2017-08-25 DIAGNOSIS — Z130 Encounter for screening for diseases of the blood and blood-forming organs and certain disorders involving the immune mechanism: Secondary | ICD-10-CM | POA: Diagnosis not present

## 2017-08-25 DIAGNOSIS — Z131 Encounter for screening for diabetes mellitus: Secondary | ICD-10-CM | POA: Diagnosis not present

## 2017-08-25 DIAGNOSIS — Z1329 Encounter for screening for other suspected endocrine disorder: Secondary | ICD-10-CM | POA: Diagnosis not present

## 2017-08-25 DIAGNOSIS — Q211 Atrial septal defect: Secondary | ICD-10-CM

## 2017-08-25 DIAGNOSIS — F329 Major depressive disorder, single episode, unspecified: Secondary | ICD-10-CM

## 2017-08-25 DIAGNOSIS — R197 Diarrhea, unspecified: Secondary | ICD-10-CM

## 2017-08-25 DIAGNOSIS — E785 Hyperlipidemia, unspecified: Secondary | ICD-10-CM | POA: Diagnosis not present

## 2017-08-25 DIAGNOSIS — F32A Depression, unspecified: Secondary | ICD-10-CM

## 2017-08-25 DIAGNOSIS — I6930 Unspecified sequelae of cerebral infarction: Secondary | ICD-10-CM

## 2017-08-25 DIAGNOSIS — Q2112 Patent foramen ovale: Secondary | ICD-10-CM

## 2017-08-25 DIAGNOSIS — F419 Anxiety disorder, unspecified: Secondary | ICD-10-CM

## 2017-08-25 MED ORDER — SERTRALINE HCL 50 MG PO TABS: 50.0000 mg | ORAL_TABLET | Freq: Every day | ORAL | 0 refills | Status: DC

## 2017-08-25 NOTE — Patient Instructions (Addendum)
Call Dr[facility]ll [facility] in [facility] to schedule follow up.  Consider surgery.      Stroke Prevention Some health problems and behaviors may make it more likely for you to have a stroke. Below are ways to lessen your risk of having a stroke.  Be active for at least 30 minutes on most or all days.  Do not smoke. Try not to be around others who smoke.  Do not drink too much alcohol. ? Do not have more than 2 drinks a day if you are a man. ? Do not have more than 1 drink a day if you are a woman and are not pregnant.  Eat healthy foods, such as fruits and vegetables. If you were put on a specific diet, follow the diet as told.  Keep your cholesterol levels under control through diet and medicines. Look for foods that are low in saturated fat, trans fat, cholesterol, and are high in fiber.  If you have diabetes, follow all diet plans and take your medicine as told.  Ask your doctor if you need treatment to lower your blood pressure. If you have high blood pressure (hypertension), follow all diet plans and take your medicine as told by your doctor.  If you are 3518-581 years old, have your blood pressure checked every 3-5 years. If you are age 45 or older, have your blood pressure checked every year.  Keep a healthy weight. Eat foods that are low in calories, salt, saturated fat, trans fat, and cholesterol.  Do not take drugs.  Avoid birth control pills, if this applies. Talk to your doctor about the risks of taking birth control pills.  Talk to your doctor if you have sleep problems (sleep apnea).  Take all medicine as told by your doctor. ? You may be told to take aspirin or blood thinner medicine. Take this medicine as told by your doctor. ? Understand your medicine instructions.  Make sure any other conditions you have are being taken care of.  Get help right away if:  You suddenly lose feeling (you feel numb) or have weakness in your face, arm, or leg.  Your face or  eyelid hangs down to one side.  You suddenly feel confused.  You have trouble talking (aphasia) or understanding what people are saying.  You suddenly have trouble seeing in one or both eyes.  You suddenly have trouble walking.  You are dizzy.  You lose your balance or your movements are clumsy (uncoordinated).  You suddenly have a very bad headache and you do not know the cause.  You have new chest pain.  Your heart feels like it is fluttering or skipping a beat (irregular heartbeat). Do not wait to see if the symptoms above go away. Get help right away. Call your local emergency services (911 in [place] Do not drive yourself to the hospital. This information is not intended to replace advice given to you by your health care provider. Make sure you discuss any questions you have with your health care provider. Document Released: 01/17/2012 Document Revised: 12/24/2015 Document Reviewed: 01/18/2013 Elsevier Interactive Patient Education  [employer]2018 Elsevier Inc.

## 2017-08-25 NOTE — Progress Notes (Signed)
Patient: Tammy Sosa Female    DOB: 02/21/1971   47 y.o.   MRN: [id number] Visit Date: 08/25/2017  Today's Provider: Trey SailorsAdriana M Nazim Kadlec, PA-C   Chief Complaint  Patient presents with  . Establish Care   Subjective:    HPI  Tammy Sosa is a 47 y/o woman presenting to establish care today. She is living in  [place] with husband of 14 years. She has five children, her father also lives with her.   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   Her history is significant for stroke in 2017. [facility] with right sided weakness. On her MRI Brain from 08/23/2015 there was a "very small acute nonhemorrhagic infarct left subinsular region and left periatrial region. Tiny acute nonhemorrhagic infarcts posterior left operculum region."   She was on estrogen containing birth control at the time. Her carotid dopplers were negative. LDL slightly elevated at 108. TEE with positive bubble study revealed PFO. BLE dopplers were negative. Per inpatient recommendations she was discharged on ASA and high intensity statin with outpatient holter monitoring.   She followed up with Dr. Sherryll BurgerShah outpatient at [facility]. Her last visit was 06/01/2016. 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 At that point, she was no longer on hormonal birth control and had an IUD. Her coagulation studies were all negative. She had a sleep study which was negative. He recommended her to pursue PFO closure. He also recommended her to continue ASA 81 mg and atorvastatin 40 mg daily.   She was also [facility] by [facility] in 11/2016 who recommended it would also be reasonable to pursue PFO closure pending their review of her 30 day holter monitor to make sure there is no atrial fibrillation.  She says she has stopped taking atorvastatin. She said nobody has told her if she should continue taking it. She also says nobody has given her a straight answer about whether or not  she should have the surgery. She says she is nervous to have her PFO repaired because she saw a commercial on TV about a law suit against mesh devices.   She also reports worsening of anxiety. She used to be on zoloft 100 mg daily for this and would like to restart. Her anxiety has been present in significant quantities since her stroke. At this time, her husband cheated on her. They are still together, but she doesn't describe her relationship positively. She also has anxiety that she will have another medical event.   Her last PAP was 12/2016 with [facility], it was normal.   She also mentions that she is having stomach pains, cramping, and diarrhea with "everything" she eats. When asked if she keeps a food diary to log symptoms, she says she can't because she can't remember what she ate. No family history of colon cancer. No blood in her stool. No fevers, chills, joint pain. She says this has been happening for one year.  No Known Allergies   Current Outpatient Medications:  .  aspirin EC 81 MG tablet, Take 81 mg by mouth., Disp: , Rfl:  .  levonorgestrel (MIRENA) 20 MCG/24HR IUD, 1 each by Intrauterine route once., Disp: , Rfl:  .  atorvastatin (LIPITOR) 40 MG tablet, Take 1 tablet (40 mg total) by mouth daily at 6 PM. (Patient not taking: Reported on 08/25/2017), Disp: 30 tablet, Rfl: 0 .  diclofenac sodium (VOLTAREN) 1 % GEL, Apply 2  g topically 4 (four) times daily. Rub into affected area of foot 2 to 4 times daily (Patient not taking: Reported on 01/26/2017), Disp: 100 g, Rfl: 2 .  furosemide (LASIX) 40 MG tablet, TAKE 1 TABLET BY MOUTH EVERY DAY IN THE MORNING, Disp: , Rfl: 0 .  ibuprofen (ADVIL,MOTRIN) 800 MG tablet, Take 800 mg by mouth., Disp: , Rfl:   Review of Systems  Constitutional: Positive for activity change, appetite change and fatigue.  HENT: Positive for rhinorrhea.   Eyes: Negative.   Respiratory: Positive for choking.   Cardiovascular: Negative.   Gastrointestinal:  Negative.   Endocrine: Negative.   Genitourinary: Positive for flank pain and frequency.  Skin: Negative.   Allergic/Immunologic: Negative.   Neurological: Positive for light-headedness.  Hematological: Negative.   Psychiatric/Behavioral: Positive for agitation and suicidal ideas. The patient is nervous/anxious.    Family History  Problem Relation Age of Onset  . Breast cancer Mother 25  . Ovarian cancer Mother 55  . Diabetes Mother   . COPD Mother   . Hypertension Mother   . Skin cancer Father   . Stomach cancer Paternal Uncle   . Stomach cancer Maternal Grandfather   . Diabetes Paternal Grandmother     Past Surgical History:  Procedure Laterality Date  . CERVICAL CONIZATION W/BX    . KNEE SURGERY Right     Social History   Tobacco Use  . Smoking status: Never Smoker  . Smokeless tobacco: Never Used  Substance Use Topics  . Alcohol use: Yes    Alcohol/week: 2.4 oz    Types: 1 Glasses of wine, 1 Cans of beer, 2 Shots of liquor per week   Objective:   BP 114/72 (BP Location: Right Arm, Patient Position: Sitting, Cuff Size: Normal)   Pulse 76   Temp 97.8 F (36.6 C) (Oral)   Resp 16   Ht 5\' 4"  (1.626 m)   Wt 193 lb (87.5 kg)   BMI 33.13 kg/m  Vitals:   08/25/17 0935  BP: 114/72  Pulse: 76  Resp: 16  Temp: 97.8 F (36.6 C)  TempSrc: Oral  Weight: 193 lb (87.5 kg)  Height: 5\' 4"  (1.626 m)     Physical Exam  Constitutional: She is oriented to person, place, and time. She appears well-developed and well-nourished.  HENT:  Head: Normocephalic and atraumatic.  Neck: Neck supple.  Cardiovascular: Normal rate and regular rhythm.  Pulmonary/Chest: Effort normal and breath sounds normal.  Abdominal: Soft. Bowel sounds are normal.  Musculoskeletal: She exhibits no edema, tenderness or deformity.  Lymphadenopathy:    She has no cervical adenopathy.  Neurological: She is alert and oriented to person, place, and time.  Skin: Skin is warm and dry.    Psychiatric: Her behavior is normal. Her mood appears anxious.        Assessment & Plan:     1. Anxiety and depression  Chronic anxiety with recent worsening. Will restart zoloft today, follow up in one month.  - sertraline (ZOLOFT) 50 MG tablet; Take 1 tablet (50 mg total) by mouth daily.  Dispense: 90 tablet; Refill: 0 - TSH  2. Hyperlipidemia, unspecified hyperlipidemia type  History of ischemic stroke. She was previously on Lipitor but self discontinued. I have reviewed the EMR and per neurology, she should continue this. Will check lipid panel and CMET to confirm liver function supports this, then will likely restart.  - Lipid Profile  3. Screening for deficiency anemia  - CBC with Differential  4. Screening  for diabetes mellitus  - Comprehensive Metabolic Panel (CMET)  5. Screening for thyroid disorder  - TSH  6. Diarrhea, unspecified type  Keep a food log and symptom log.  - IgA - Tissue Transglutaminase Abs,IgG,IgA  7. Patent foramen ovale  Patient seems conflicted on having surgery. She says nobody has told her definitively, but upon EMR it seems both neurology and cardiology have rather explicitly recommended surgery given her age and history. She needs to decide this for herself and follow up with cardiology if she wishes to pursue.  8. History of stroke with residual deficit  She needs to follow up with Dr. Sherryll Burger. Last visit with neurology was 11/2016 with instructed 3 month follow up for memory testing, but patient did not go. Patient should contact Dr. Margaretmary Eddy office to schedule this as she is still a patient there and shouldn't require a referral.  Return in about 1 month (around 09/25/2017) for anxiety depression .  The entirety of the information documented in the History of Present Illness, Review of Systems and Physical Exam were personally obtained by me. Portions of this information were initially documented by Kavin Leech, CMA and reviewed by me for  thoroughness and accuracy.           Trey Sailors, PA-C  [facility]

## 2017-08-27 LAB — LIPID PANEL
Chol/HDL Ratio: 3.8 ratio (ref 0.0–4.4)
Cholesterol, Total: 164 mg/dL (ref 100–199)
HDL: 43 mg/dL (ref >39)
LDL Calculated: 105 mg/dL — ABNORMAL HIGH (ref 0–99)
Triglycerides: 81 mg/dL (ref 0–149)
VLDL Cholesterol Cal: 16 mg/dL (ref 5–40)

## 2017-08-27 LAB — CBC WITH DIFFERENTIAL/PLATELET
Basophils Absolute: 0.1 10*3/uL (ref 0.0–0.2)
Basos: 1 %
EOS (ABSOLUTE): 0.1 10*3/uL (ref 0.0–0.4)
Eos: 1 %
Hematocrit: 36.9 % (ref 34.0–46.6)
Hemoglobin: 12.6 g/dL (ref 11.1–15.9)
Immature Grans (Abs): 0 10*3/uL (ref 0.0–0.1)
Immature Granulocytes: 0 %
Lymphocytes Absolute: 2.8 10*3/uL (ref 0.7–3.1)
Lymphs: 37 %
MCH: 29.4 pg (ref 26.6–33.0)
MCHC: 34.1 g/dL (ref 31.5–35.7)
MCV: 86 fL (ref 79–97)
Monocytes Absolute: 0.5 10*3/uL (ref 0.1–0.9)
Monocytes: 6 %
Neutrophils Absolute: 4.1 10*3/uL (ref 1.4–7.0)
Neutrophils: 55 %
Platelets: 247 10*3/uL (ref 150–379)
RBC: 4.28 x10E6/uL (ref 3.77–5.28)
RDW: 13.7 % (ref 12.3–15.4)
WBC: 7.6 10*3/uL (ref 3.4–10.8)

## 2017-08-27 LAB — COMPREHENSIVE METABOLIC PANEL
ALT: 12 IU/L (ref 0–32)
AST: 12 IU/L (ref 0–40)
Albumin/Globulin Ratio: 1.6 (ref 1.2–2.2)
Albumin: 4.2 g/dL (ref 3.5–5.5)
Alkaline Phosphatase: 62 IU/L (ref 39–117)
BUN/Creatinine Ratio: 15 (ref 9–23)
BUN: 10 mg/dL (ref 6–24)
Bilirubin Total: <0.2 mg/dL (ref 0.0–1.2)
CO2: 21 mmol/L (ref 20–29)
Calcium: 9.2 mg/dL (ref 8.7–10.2)
Chloride: 104 mmol/L (ref 96–106)
Creatinine, Ser: 0.65 mg/dL (ref 0.57–1.00)
GFR calc Af Amer: 125 mL/min/{1.73_m2} (ref >59)
GFR calc non Af Amer: 108 mL/min/{1.73_m2} (ref >59)
Globulin, Total: 2.6 g/dL (ref 1.5–4.5)
Glucose: 101 mg/dL — ABNORMAL HIGH (ref 65–99)
Potassium: 4.5 mmol/L (ref 3.5–5.2)
Sodium: 138 mmol/L (ref 134–144)
Total Protein: 6.8 g/dL (ref 6.0–8.5)

## 2017-08-27 LAB — TSH: TSH: 2.03 u[IU]/mL (ref 0.450–4.500)

## 2017-08-27 LAB — IGA: IgA/Immunoglobulin A, Serum: 163 mg/dL (ref 87–352)

## 2017-08-27 LAB — TISSUE TRANSGLUTAMINASE ABS,IGG,IGA
Tissue Transglut Ab: <2 U/mL (ref 0–5)
Transglutaminase IgA: <2 U/mL (ref 0–3)

## 2017-08-28 ENCOUNTER — Other Ambulatory Visit: Payer: Self-pay | Admitting: Physician Assistant

## 2017-08-28 DIAGNOSIS — I6349 Cerebral infarction due to embolism of other cerebral artery: Secondary | ICD-10-CM

## 2017-08-28 DIAGNOSIS — E785 Hyperlipidemia, unspecified: Secondary | ICD-10-CM

## 2017-08-28 MED ORDER — ATORVASTATIN CALCIUM 10 MG PO TABS: 10.0000 mg | ORAL_TABLET | Freq: Every day | ORAL | 3 refills | Status: DC

## 2017-08-29 DIAGNOSIS — I69398 Other sequelae of cerebral infarction: Secondary | ICD-10-CM | POA: Diagnosis not present

## 2017-08-29 DIAGNOSIS — R42 Dizziness and giddiness: Secondary | ICD-10-CM | POA: Diagnosis not present

## 2017-08-29 DIAGNOSIS — G43119 Migraine with aura, intractable, without status migrainosus: Secondary | ICD-10-CM | POA: Diagnosis not present

## 2017-08-29 DIAGNOSIS — R413 Other amnesia: Secondary | ICD-10-CM | POA: Diagnosis not present

## 2017-08-30 ENCOUNTER — Telehealth: Payer: Self-pay

## 2017-08-30 NOTE — Telephone Encounter (Signed)
-----   Message from Trey SailorsAdriana M Pollak, [place]PA-C sent at 08/28/2017  3:50 PM EST ----- Her LDL is slightly elevated, above the level desired in people who have had ischemic stroke previously. Her liver function is normal and I will send her in Lipitor 10 mg per guidelines and also neurology's recommendations. She can take this once nightly. CBC normal. Sugar slightly high, kidney function normal. TSh normal. Celiac testing for diarrhea negative.

## 2017-08-30 NOTE — Telephone Encounter (Signed)
Pt advised.   Thanks,   -Laura  

## 2017-09-05 ENCOUNTER — Other Ambulatory Visit: Payer: Self-pay | Admitting: Neurology

## 2017-09-05 DIAGNOSIS — I69398 Other sequelae of cerebral infarction: Secondary | ICD-10-CM

## 2017-09-05 DIAGNOSIS — R2689 Other abnormalities of gait and mobility: Secondary | ICD-10-CM

## 2017-09-11 ENCOUNTER — Ambulatory Visit
Admission: RE | Admit: 2017-09-11 | Discharge: 2017-09-11 | Disposition: A | Discharge status: Home / Self Care | Payer: BLUE CROSS/BLUE SHIELD | Source: Ambulatory Visit | Attending: Neurology | Admitting: Neurology

## 2017-09-11 DIAGNOSIS — R51 Headache: Secondary | ICD-10-CM | POA: Diagnosis not present

## 2017-09-11 DIAGNOSIS — R2689 Other abnormalities of gait and mobility: Secondary | ICD-10-CM | POA: Diagnosis not present

## 2017-09-11 DIAGNOSIS — I69398 Other sequelae of cerebral infarction: Secondary | ICD-10-CM

## 2017-09-11 DIAGNOSIS — G9389 Other specified disorders of brain: Secondary | ICD-10-CM | POA: Diagnosis not present

## 2017-09-18 ENCOUNTER — Encounter: Payer: Self-pay | Admitting: Physician Assistant

## 2017-09-28 ENCOUNTER — Encounter: Payer: Self-pay | Admitting: Physician Assistant

## 2017-09-28 ENCOUNTER — Ambulatory Visit (INDEPENDENT_AMBULATORY_CARE_PROVIDER_SITE_OTHER): Payer: BLUE CROSS/BLUE SHIELD | Admitting: Physician Assistant

## 2017-09-28 VITALS — BP 112/70 | HR 60 | Temp 98.5°F | Resp 16 | Wt 188.0 lb

## 2017-09-28 DIAGNOSIS — F329 Major depressive disorder, single episode, unspecified: Secondary | ICD-10-CM

## 2017-09-28 DIAGNOSIS — J3489 Other specified disorders of nose and nasal sinuses: Secondary | ICD-10-CM | POA: Diagnosis not present

## 2017-09-28 DIAGNOSIS — F32A Depression, unspecified: Secondary | ICD-10-CM | POA: Insufficient documentation

## 2017-09-28 DIAGNOSIS — F419 Anxiety disorder, unspecified: Secondary | ICD-10-CM | POA: Diagnosis not present

## 2017-09-28 MED ORDER — SERTRALINE HCL 100 MG PO TABS: 100.0000 mg | ORAL_TABLET | Freq: Every day | ORAL | 0 refills | Status: DC

## 2017-09-28 MED ORDER — MUPIROCIN 2 % EX OINT: TOPICAL_OINTMENT | CUTANEOUS | 0 refills | Status: DC

## 2017-09-28 NOTE — Progress Notes (Signed)
Patient: Tammy Sosa Female    DOB: 07/18/1971   47 y.o.   MRN: [id number] Visit Date: 09/29/2017  Today's Provider: Trey SailorsAdriana M Shasta Chinn, PA-C   Chief Complaint  Patient presents with  . Anxiety  . Depression   Subjective:    [name] is a 47 y/o woman with history of anxiety, stroke, migraine, PFO presenting today for follow up of anxiety and depression. She was started on 50 mg zoloft daily at last visit. She reports improvement in her mood, decreased crying spells. She would like to increase the dose of zoloft.   She also reports seeing Dr. Sherryll BurgerShah in neurology for follow up. She had a recent brain MRI which did not reveal any new strokes. She was started on topamax and flexeril.   Patient also reports right nostril pain, some bleeding. Has been frequently blowing her nose.   Anxiety  Presents for follow-up visit. Symptoms include decreased concentration, insomnia and nervous/anxious behavior. Patient reports no chest pain, compulsions, confusion, depressed mood, dizziness, dry mouth, excessive worry, feeling of choking, hyperventilation, impotence, irritability, malaise, muscle tension, nausea, obsessions, palpitations, panic, restlessness, shortness of breath or suicidal ideas. The quality of sleep is poor. Nighttime awakenings: one to two.    Depression         This is a chronic problem.  The problem has been gradually improving (Pt reports improvement but not to goal yet. ) since onset.  Associated symptoms include decreased concentration and insomnia.  Associated symptoms include no fatigue, no helplessness, no hopelessness, not irritable, no restlessness, no decreased interest, no appetite change, no body aches, no myalgias, no headaches, no indigestion, not sad and no suicidal ideas.  Past treatments include SSRIs - Selective serotonin reuptake inhibitors.  Compliance with treatment is good.  Previous treatment provided moderate relief.  Past medical history includes  anxiety.        No Known Allergies   Current Outpatient Medications:  .  aspirin EC 81 MG tablet, Take 81 mg by mouth., Disp: , Rfl:  .  atorvastatin (LIPITOR) 10 MG tablet, Take 1 tablet (10 mg total) by mouth daily., Disp: 90 tablet, Rfl: 3 .  cyclobenzaprine (FLEXERIL) 10 MG tablet, Take by mouth., Disp: , Rfl:  .  furosemide (LASIX) 40 MG tablet, TAKE 1 TABLET BY MOUTH EVERY DAY IN THE MORNING, Disp: , Rfl: 0 .  ibuprofen (ADVIL,MOTRIN) 800 MG tablet, Take 800 mg by mouth., Disp: , Rfl:  .  levonorgestrel (MIRENA) 20 MCG/24HR IUD, 1 each by Intrauterine route once., Disp: , Rfl:  .  topiramate (TOPAMAX) 50 MG tablet, Take by mouth., Disp: , Rfl:  .  mupirocin ointment (BACTROBAN) 2 %, Dab onto lesion in nose with q-tip up to twice daily., Disp: 22 g, Rfl: 0 .  sertraline (ZOLOFT) 100 MG tablet, Take 1 tablet (100 mg total) by mouth daily., Disp: 90 tablet, Rfl: 0  Review of Systems  Constitutional: Negative.  Negative for appetite change, fatigue and irritability.  HENT: Positive for nosebleeds.   Respiratory: Negative.  Negative for shortness of breath.   Cardiovascular: Negative.  Negative for chest pain and palpitations.  Gastrointestinal: Negative.  Negative for nausea.  Genitourinary: Negative for impotence.  Musculoskeletal: Negative for myalgias.  Neurological: Negative for dizziness and headaches.  Psychiatric/Behavioral: Positive for decreased concentration and depression. Negative for agitation, behavioral problems, confusion, dysphoric mood, hallucinations, self-injury, sleep disturbance and suicidal ideas. The patient is nervous/anxious and has insomnia. The patient is  not hyperactive.     Social History   Tobacco Use  . Smoking status: Never Smoker  . Smokeless tobacco: Never Used  Substance Use Topics  . Alcohol use: Yes    Alcohol/week: 2.4 oz    Types: 1 Glasses of wine, 1 Cans of beer, 2 Shots of liquor per week   Objective:   BP 112/70 (BP Location:  Right Arm, Patient Position: Sitting, Cuff Size: Normal)   Pulse 60   Temp 98.5 F (36.9 C) (Oral)   Resp 16   Wt 188 lb (85.3 kg)   BMI 32.27 kg/m  Vitals:   09/28/17 0945  BP: 112/70  Pulse: 60  Resp: 16  Temp: 98.5 F (36.9 C)  TempSrc: Oral  Weight: 188 lb (85.3 kg)     Physical Exam  Constitutional: She is oriented to person, place, and time. She appears well-developed and well-nourished. She is not irritable.  HENT:  Nose:    Cardiovascular: Normal rate and regular rhythm.  Pulmonary/Chest: Effort normal and breath sounds normal.  Neurological: She is alert and oriented to person, place, and time.  Skin: Skin is warm and dry.  Psychiatric: She has a normal mood and affect. Her behavior is normal.        Assessment & Plan:     1. Anxiety and depression  - sertraline (ZOLOFT) 100 MG tablet; Take 1 tablet (100 mg total) by mouth daily.  Dispense: 90 tablet; Refill: 0  2. Lesion of nose  - mupirocin ointment (BACTROBAN) 2 %; Dab onto lesion in nose with q-tip up to twice daily.  Dispense: 22 g; Refill: 0  Return in about 1 month (around 10/26/2017) for anxiety.  The entirety of the information documented in the History of Present Illness, Review of Systems and Physical Exam were personally obtained by me. Portions of this information were initially documented by Kavin Leech, CMA and reviewed by me for thoroughness and accuracy.        Trey Sailors, PA-C  [facility]

## 2017-09-29 NOTE — Patient Instructions (Signed)

## 2017-10-13 DIAGNOSIS — L659 Nonscarring hair loss, unspecified: Secondary | ICD-10-CM | POA: Diagnosis not present

## 2017-10-23 DIAGNOSIS — M7711 Lateral epicondylitis, right elbow: Secondary | ICD-10-CM | POA: Diagnosis not present

## 2017-10-26 ENCOUNTER — Ambulatory Visit (INDEPENDENT_AMBULATORY_CARE_PROVIDER_SITE_OTHER): Payer: BLUE CROSS/BLUE SHIELD | Admitting: Physician Assistant

## 2017-10-26 ENCOUNTER — Encounter: Payer: Self-pay | Admitting: Physician Assistant

## 2017-10-26 VITALS — BP 116/74 | HR 68 | Temp 98.6°F | Resp 16 | Wt 190.0 lb

## 2017-10-26 DIAGNOSIS — F32A Depression, unspecified: Secondary | ICD-10-CM

## 2017-10-26 DIAGNOSIS — F329 Major depressive disorder, single episode, unspecified: Secondary | ICD-10-CM

## 2017-10-26 DIAGNOSIS — M654 Radial styloid tenosynovitis [de Quervain]: Secondary | ICD-10-CM | POA: Diagnosis not present

## 2017-10-26 DIAGNOSIS — F419 Anxiety disorder, unspecified: Secondary | ICD-10-CM

## 2017-10-26 MED ORDER — PREDNISONE 10 MG (21) PO TBPK: ORAL_TABLET | ORAL | 0 refills | Status: DC

## 2017-10-26 NOTE — Patient Instructions (Signed)
THUMB SPICA SPLINT FOR RIGHT WRIST.    De Quervain Tenosynovitis Tendons attach muscles to bones. They also help with joint movements. When tendons become irritated or swollen, it is called tendinitis. The extensor pollicis brevis (EPB) tendon connects the EPB muscle to a bone that is near the base of the thumb. The EPB muscle helps to straighten and extend the thumb. De Quervain tenosynovitis is a condition in which the EPB tendon lining (sheath) becomes irritated, thickened, and swollen. This condition is sometimes called stenosing tenosynovitis. This condition causes pain on the thumb side of the back of the wrist. What are the causes? Causes of this condition include:  Activities that repeatedly cause your thumb and wrist to extend.  A sudden increase in activity or change in activity that affects your wrist.  What increases the risk? This condition is more likely to develop in:  Females.  People who have diabetes.  Women who have recently given birth.  People who are over 45 years of age.  People who do activities that involve repeated hand and wrist motions, such as tennis, racquetball, volleyball, gardening, and taking care of children.  People who do heavy labor.  People who have poor wrist strength and flexibility.  People who do not warm up properly before activities.  What are the signs or symptoms? Symptoms of this condition include:  Pain or tenderness over the thumb side of the back of the wrist when your thumb and wrist are not moving.  Pain that gets worse when you straighten your thumb or extend your thumb or wrist.  Pain when the injured area is touched.  Locking or catching of the thumb joint while you bend and straighten your thumb.  Decreased thumb motion due to pain.  Swelling over the affected area.  How is this diagnosed? This condition is diagnosed with a medical history and physical exam. Your health care provider will ask for details about  your injury and ask about your symptoms. How is this treated? Treatment may include the use of icing and medicines to reduce pain and swelling. You may also be advised to wear a splint or brace to limit your thumb and wrist motion. In less severe cases, treatment may also include working with a physical therapist to strengthen your wrist and calm the irritation around your EPB tendon sheath. In severe cases, surgery may be needed. Follow these instructions at home: If you have a splint or brace:  Wear it as told by your health care provider. Remove it only as told by your health care provider.  Loosen the splint or brace if your fingers become numb and tingle, or if they turn cold and blue.  Keep the splint or brace clean and dry. Managing pain, stiffness, and swelling  If directed, apply ice to the injured area. ? Put ice in a plastic bag. ? Place a towel between your skin and the bag. ? Leave the ice on for 20 minutes, 2-3 times per day.  Move your fingers often to avoid stiffness and to lessen swelling.  Raise (elevate) the injured area above the level of your heart while you are sitting or lying down. General instructions  Return to your normal activities as told by your health care provider. Ask your health care provider what activities are safe for you.  Take over-the-counter and prescription medicines only as told by your health care provider.  Keep all follow-up visits as told by your health care provider. This is important.  Do not drive or operate heavy machinery while taking prescription pain medicine. Contact a health care provider if:  Your pain, tenderness, or swelling gets worse, even if you have had treatment.  You have numbness or tingling in your wrist, hand, or fingers on the injured side. This information is not intended to replace advice given to you by your health care provider. Make sure you discuss any questions you have with your health care  provider. Document Released: 07/18/2005 Document Revised: 12/24/2015 Document Reviewed: 09/23/2014 Elsevier Interactive Patient Education  [employer]2018 [employer]

## 2017-10-26 NOTE — Progress Notes (Signed)
Patient: Tammy Sosa Female    DOB: March 06, 1971   47 y.o.   MRN: [id number] Visit Date: 10/26/2017  Today's Provider: Trey Sailors, PA-C   Chief Complaint  Patient presents with  . Depression    One month follow up  . Anxiety  . Wrist Pain    Right wrist started a few months ago.  Worsening in the last week.   Subjective:    Tammy Sosa is a 47 y/o woman with history of depression and anxiety who presents today to follow up for the same and also issue with right wrist pain.  Last visit, we increased zoloft from 50mg  QD to 100 mg QD and the patient is doing well on this dose. She would like to stay on this dose.   She is having right wrist problems. She has been having wrist problems for several months with recent worsening. In 2017 she hit her wrist against a wall and was evaluated in the ER with negative xrays. 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 She has pain and swelling along the lateral aspect of her right thumb. She has pain with extension of her right thumb. She has had no recent injuries. She had seen [facility] for this and was treated for tennis elbow with NSAIDs and a sling. She has stopped NSAIDs due to stomach upset and sling has not helped.  Depression         This is a chronic problem.  The problem has been gradually improving since onset.  Associated symptoms include no decreased concentration, no fatigue, no helplessness, no hopelessness, does not have insomnia, not irritable, no restlessness, no decreased interest, no appetite change, no body aches, no myalgias, no headaches, no indigestion, not sad and no suicidal ideas.  Past treatments include SSRIs - Selective serotonin reuptake inhibitors.  Compliance with treatment is good.  Past medical history includes anxiety.   Anxiety  Presents for follow-up visit.  Symptoms include depressed mood, excessive worry and nervous/anxious behavior. Patient reports no chest pain, compulsions, confusion, decreased concentration, dizziness, dry mouth, feeling of choking, hyperventilation, impotence, insomnia, irritability, malaise, muscle tension, nausea, obsessions, palpitations, panic, restlessness, shortness of breath or suicidal ideas. The quality of sleep is good.    Wrist Pain   The pain is present in the right wrist. This is a chronic problem. The current episode started more than 1 month ago. There has been no history of extremity trauma. The problem occurs constantly. The problem has been gradually worsening. The quality of the pain is described as sharp Engineer, site). Associated symptoms include joint swelling and numbness. Pertinent negatives include no fever, itching, joint locking, limited range of motion, stiffness or tingling. She has tried NSAIDS for the symptoms.       No Known Allergies   Current Outpatient Medications:  .  aspirin EC 81 MG tablet, Take 81 mg by mouth., Disp: , Rfl:  .  atorvastatin (LIPITOR) 10 MG tablet, Take 1 tablet (10 mg total) by mouth daily., Disp: 90 tablet, Rfl: 3 .  cyclobenzaprine (FLEXERIL) 10 MG tablet, Take by mouth., Disp: , Rfl:  .  furosemide (LASIX) 40 MG tablet, TAKE 1 TABLET BY MOUTH EVERY DAY IN THE MORNING, Disp: , Rfl: 0 .  levonorgestrel (MIRENA) 20 MCG/24HR IUD, 1 each by Intrauterine  route once., Disp: , Rfl:  .  naproxen (NAPROSYN) 375 MG tablet, Take 375 mg by mouth 2 (two) times daily., Disp: , Rfl: 0 .  sertraline (ZOLOFT) 100 MG tablet, Take 1 tablet (100 mg total) by mouth daily., Disp: 90 tablet, Rfl: 0 .  topiramate (TOPAMAX) 50 MG tablet, Take by mouth., Disp: , Rfl:  .  ibuprofen (ADVIL,MOTRIN) 800 MG tablet, Take 800 mg by mouth., Disp: , Rfl:  .  mupirocin ointment (BACTROBAN) 2 %, Dab onto lesion in nose with q-tip up to twice daily. (Patient not taking: Reported on 10/26/2017), Disp: 22 g,  Rfl: 0  Review of Systems  Constitutional: Negative.  Negative for appetite change, fatigue, fever and irritability.  Respiratory: Negative.  Negative for shortness of breath.   Cardiovascular: Negative.  Negative for chest pain and palpitations.  Gastrointestinal: Negative.  Negative for nausea.  Genitourinary: Negative for impotence.  Musculoskeletal: Negative for myalgias and stiffness.  Skin: Negative for itching.  Neurological: Positive for numbness. Negative for dizziness, tingling and headaches.  Psychiatric/Behavioral: Positive for depression. Negative for agitation, behavioral problems, confusion, decreased concentration, dysphoric mood, hallucinations, self-injury, sleep disturbance and suicidal ideas. The patient is nervous/anxious. The patient does not have insomnia and is not hyperactive.     Social History   Tobacco Use  . Smoking status: Never Smoker  . Smokeless tobacco: Never Used  Substance Use Topics  . Alcohol use: Yes    Alcohol/week: 2.4 oz    Types: 1 Glasses of wine, 1 Cans of beer, 2 Shots of liquor per week   Objective:   BP 116/74 (BP Location: Right Arm, Patient Position: Sitting, Cuff Size: Normal)   Pulse 68   Temp 98.6 F (37 C) (Oral)   Resp 16   Wt 190 lb (86.2 kg)   BMI 32.61 kg/m  Vitals:   10/26/17 0948  BP: 116/74  Pulse: 68  Resp: 16  Temp: 98.6 F (37 C)  TempSrc: Oral  Weight: 190 lb (86.2 kg)     Physical Exam  Constitutional: She is oriented to person, place, and time. She appears well-developed and well-nourished. She is not irritable.  Musculoskeletal: She exhibits edema and tenderness. She exhibits no deformity.       Right wrist: She exhibits tenderness.  Tenderness along lateral aspect of right thumb and radial styloid. Positive Finkelstein's on right hand. Left hand normal.   Neurological: She is alert and oriented to person, place, and time.  Skin: Skin is warm and dry.  Psychiatric: She has a normal mood and affect.  Her behavior is normal.        Assessment & Plan:     1. De Quervain's tenosynovitis, right  Counseled on conservative treatment including steroids, NSAIDs, thumb spica splint. If these do not work, will send for orthopedic evaluation and possible cortisone injection.   - predniSONE (STERAPRED UNI-PAK 21 TAB) 10 MG (21) TBPK tablet; Take 6 pills on day 1, 5 pills on day 2, and so on until complete.  Dispense: 21 tablet; Refill: 0  2. Anxiety and depression  Will refill zoloft at current dose. F/u 6 mo.  Return if symptoms worsen or fail to improve.  The entirety of the information documented in the History of Present Illness, Review of Systems and Physical Exam were personally obtained by me. Portions of this information were initially documented by Kavin LeechLaura Walsh, CMA and reviewed by me for thoroughness and accuracy.          Ricki RodriguezAdriana  Iva Lento, PA-C  [facility]

## 2017-11-23 ENCOUNTER — Other Ambulatory Visit: Payer: Self-pay | Admitting: Physician Assistant

## 2017-11-23 DIAGNOSIS — F32A Depression, unspecified: Secondary | ICD-10-CM

## 2017-11-23 DIAGNOSIS — F329 Major depressive disorder, single episode, unspecified: Secondary | ICD-10-CM

## 2017-11-23 DIAGNOSIS — F419 Anxiety disorder, unspecified: Principal | ICD-10-CM

## 2017-11-29 DIAGNOSIS — E669 Obesity, unspecified: Secondary | ICD-10-CM | POA: Insufficient documentation

## 2017-11-29 DIAGNOSIS — M7711 Lateral epicondylitis, right elbow: Secondary | ICD-10-CM | POA: Diagnosis not present

## 2017-12-05 ENCOUNTER — Other Ambulatory Visit: Payer: Self-pay | Admitting: Physician Assistant

## 2017-12-05 DIAGNOSIS — F32A Depression, unspecified: Secondary | ICD-10-CM

## 2017-12-05 DIAGNOSIS — F419 Anxiety disorder, unspecified: Principal | ICD-10-CM

## 2017-12-05 DIAGNOSIS — F329 Major depressive disorder, single episode, unspecified: Secondary | ICD-10-CM

## 2017-12-05 MED ORDER — SERTRALINE HCL 100 MG PO TABS: 100.0000 mg | ORAL_TABLET | Freq: Every day | ORAL | 0 refills | Status: DC

## 2017-12-05 NOTE — Telephone Encounter (Signed)
[facility] pharmacy faxed a refill request for the following medication. Thanks CC ° °sertraline (ZOLOFT) 100 MG tablet  ° °

## 2017-12-05 NOTE — Telephone Encounter (Signed)
Please review. Thanks!  

## 2018-02-09 ENCOUNTER — Telehealth: Payer: Self-pay

## 2018-02-09 NOTE — Telephone Encounter (Addendum)
Pt had Mirena placement 11/24/15. She has been spotting a lot for the past 1.5 mos. [id number]#[phone]

## 2018-02-09 NOTE — Telephone Encounter (Signed)
Spoke w/pt. Notified life events (Stress) etc can cause bleeding. Pt states she feels bloated & crampy all the time & can't feel strings. Advised pt is due for AE. Pt transferred for scheduling for AE/IUD check.

## 2018-02-09 NOTE — Telephone Encounter (Signed)
Patient is schedule 02/16/18 at 1:30

## 2018-02-16 ENCOUNTER — Ambulatory Visit: Payer: BLUE CROSS/BLUE SHIELD | Admitting: Advanced Practice Midwife

## 2018-04-02 ENCOUNTER — Other Ambulatory Visit: Payer: Self-pay | Admitting: Physician Assistant

## 2018-04-02 DIAGNOSIS — F419 Anxiety disorder, unspecified: Principal | ICD-10-CM

## 2018-04-02 DIAGNOSIS — F32A Depression, unspecified: Secondary | ICD-10-CM

## 2018-04-02 DIAGNOSIS — F329 Major depressive disorder, single episode, unspecified: Secondary | ICD-10-CM

## 2018-04-03 NOTE — Telephone Encounter (Signed)
Needs 6 mo follow up scheduled.

## 2018-04-04 NOTE — Telephone Encounter (Signed)
[email or web address]

## 2018-04-09 NOTE — Telephone Encounter (Signed)
[email or web address]

## 2018-05-02 ENCOUNTER — Encounter: Payer: Self-pay | Admitting: Physician Assistant

## 2018-05-02 ENCOUNTER — Telehealth: Payer: Self-pay | Admitting: Physician Assistant

## 2018-05-02 ENCOUNTER — Ambulatory Visit (INDEPENDENT_AMBULATORY_CARE_PROVIDER_SITE_OTHER): Payer: BLUE CROSS/BLUE SHIELD | Admitting: Physician Assistant

## 2018-05-02 VITALS — BP 110/72 | HR 64 | Temp 97.8°F | Ht 64.0 in | Wt 193.6 lb

## 2018-05-02 DIAGNOSIS — Z0000 Encounter for general adult medical examination without abnormal findings: Secondary | ICD-10-CM

## 2018-05-02 DIAGNOSIS — Q211 Atrial septal defect: Secondary | ICD-10-CM | POA: Diagnosis not present

## 2018-05-02 DIAGNOSIS — F419 Anxiety disorder, unspecified: Secondary | ICD-10-CM | POA: Diagnosis not present

## 2018-05-02 DIAGNOSIS — Z114 Encounter for screening for human immunodeficiency virus [HIV]: Secondary | ICD-10-CM

## 2018-05-02 DIAGNOSIS — E669 Obesity, unspecified: Secondary | ICD-10-CM | POA: Diagnosis not present

## 2018-05-02 DIAGNOSIS — R197 Diarrhea, unspecified: Secondary | ICD-10-CM

## 2018-05-02 DIAGNOSIS — F32A Depression, unspecified: Secondary | ICD-10-CM

## 2018-05-02 DIAGNOSIS — I6349 Cerebral infarction due to embolism of other cerebral artery: Secondary | ICD-10-CM | POA: Diagnosis not present

## 2018-05-02 DIAGNOSIS — Q2112 Patent foramen ovale: Secondary | ICD-10-CM

## 2018-05-02 DIAGNOSIS — R5383 Other fatigue: Secondary | ICD-10-CM

## 2018-05-02 DIAGNOSIS — F329 Major depressive disorder, single episode, unspecified: Secondary | ICD-10-CM

## 2018-05-02 NOTE — Telephone Encounter (Signed)
Patient advised.

## 2018-05-02 NOTE — Progress Notes (Signed)
Patient: Tammy Sosa, Female    DOB: 1970/12/09, 47 y.o.   MRN: [id number] Visit Date: 05/02/2018  Today's Provider: Trey Sailors, PA-C   Chief Complaint  Patient presents with  . Annual Exam   Subjective:    Annual physical exam Tammy Sosa is a 47 y.o. female who presents today for complete physical and follow up. She feels fairly well. She reports exercising 4-5 days per week going to the gym. She reports she is sleeping fair. She states she is a light sleeper and always fatigue. Still working at [employer], has five children.   PAP Smear: 01/26/2018 norma, negative HPV Mammo: 08/16/2016, mother wx hx breast ca dx 50's Tdap: reports it was given with her last pregnancy 5 years ago   Wt Readings from Last 3 Encounters:  05/02/18 193 lb 9.6 oz (87.8 kg)  10/26/17 190 lb (86.2 kg)  09/28/17 188 lb (85.3 kg)   Hair Loss: Reports she experienced hair loss and sees Dr. Roseanne Kaufman at [facility] and receives 5 mg finasteride for this.   Diarrhea: Reports longstanding diarrhea. Does not bring in food log today, says everything runs right through her. Denies blood in her bowel movements, denies history of colon cancer in her family.   CVA: Had a stroke in 2017, infarct of left subinsular region and left periatrial region. Today, she has some mild right sided weakness and memory loss. She was on estrogen OCP at the time which was discontinued and replaced with IUD. Stroke work up revealed a PFO. She was consulted by [facility] 11/2016 who wanted to see her back to discuss closure after they received Holter monitor from her cardiologist here in [place], Dr. Milta Deiters with [facility]. She says nobody ever contacted her after that. She says there was also a debate about what should be addressed first, her heart or her headaches and she reports it was decided her headaches should receive top priority. She does not want to follow up with [facility], wants to follow up closer to  home.   Most recently, she has felt very fatigued and stopped her Aspirin and her Lipitor because she figured this was causing her fatigue.   Weight: She wants to know why she is not losing weight. She was drinking a case of soda previously, is not doing that any longer.   Anxiety and Depression: Stopped zoloft. Says she didn't want it to cause her fatigue.  -----------------------------------------------------------------  Review of Systems  Constitutional: Positive for fatigue.  HENT: Positive for congestion and rhinorrhea.   Eyes: Positive for photophobia.  Respiratory: Positive for cough.   Cardiovascular: Negative.   Gastrointestinal: Positive for diarrhea.  Endocrine: Positive for polyuria.  Genitourinary: Positive for enuresis and vaginal discharge.  Musculoskeletal: Negative.   Skin: Negative.   Allergic/Immunologic: Negative.   Neurological: Negative.   Hematological: Negative.   Psychiatric/Behavioral: Negative.     Social History      She  reports that she has never smoked. She has never used smokeless tobacco. She reports that she drinks about 4.0 standard drinks of alcohol per week. She reports that she does not use drugs.       Social History   Socioeconomic History  . Marital status: Married    Spouse name: Not on file  . Number of children: Not on file  . Years of education: Not on file  . Highest education level: Not on file  Occupational History  . Not on file  Social Needs  . Financial resource strain: Not on file  . Food insecurity:    Worry: Not on file    Inability: Not on file  . Transportation needs:    Medical: Not on file    Non-medical: Not on file  Tobacco Use  . Smoking status: Never Smoker  . Smokeless tobacco: Never Used  Substance and Sexual Activity  . Alcohol use: Yes    Alcohol/week: 4.0 standard drinks    Types: 1 Glasses of wine, 1 Cans of beer, 2 Shots of liquor per week  . Drug use: No  . Sexual activity: Yes    Birth  control/protection: IUD  Lifestyle  . Physical activity:    Days per week: Not on file    Minutes per session: Not on file  . Stress: Not on file  Relationships  . Social connections:    Talks on phone: Not on file    Gets together: Not on file    Attends religious service: Not on file    Active member of club or organization: Not on file    Attends meetings of clubs or organizations: Not on file    Relationship status: Not on file  Other Topics Concern  . Not on file  Social History Narrative  . Not on file    Past Medical History:  Diagnosis Date  . Abnormal Papanicolaou smear of cervix with positive human papilloma virus (HPV) test 12/11/2014   LSIL/Mild dysplasia  . Asthma   . Atypical squamous cells of undetermined significance (ASCUS) on Papanicolaou smear of cervix 01/30/2013  . Family history of breast cancer in female   . Family history of ovarian cancer   . H/O arthroscopy of knee   . Herniated cervical disc   . History of conization of cervix   . History of gestational diabetes   . 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 Plantar fasciitis   . Stroke [facility])      Patient Active Problem List   Diagnosis Date Noted  . Obesity (BMI 30.0-34.9) 11/29/2017  . Anxiety and depression 09/28/2017  . Cerebral infarction (HCC) 08/23/2015  . Right sided weakness 08/22/2015  . Headache 08/22/2015  . Other specified disorders of muscle 08/22/2015    Past Surgical History:  Procedure Laterality Date  . CERVICAL CONIZATION W/BX    . KNEE SURGERY Right     Family History        Family Status  Relation Name Status  . Mother  Alive  . Father  Alive  . Oneal Grout  (Not Specified)  . MGF  (Not Specified)  . PGM  Deceased        Her family history includes Breast cancer (age of onset: 78) in her mother; COPD in her mother; Diabetes in her mother and paternal grandmother; Hypertension in her mother; Ovarian cancer (age of onset: 74) in her mother; Skin cancer in her  father; Stomach cancer in her maternal grandfather and paternal uncle.      No Known Allergies   Current Outpatient Medications:  .  levonorgestrel (MIRENA) 20 MCG/24HR IUD, 1 each by Intrauterine route once., Disp: , Rfl:  .  aspirin EC 81 MG tablet, Take 81 mg by mouth., Disp: , Rfl:  .  atorvastatin (LIPITOR) 10 MG tablet, Take 1 tablet (10 mg total) by mouth daily. (Patient not taking: Reported on 05/02/2018), Disp: 90 tablet, Rfl: 3 .  cyclobenzaprine (FLEXERIL) 10 MG tablet, Take by mouth., Disp: ,  Rfl:  .  furosemide (LASIX) 40 MG tablet, TAKE 1 TABLET BY MOUTH EVERY DAY IN THE MORNING, Disp: , Rfl: 0 .  ibuprofen (ADVIL,MOTRIN) 800 MG tablet, Take 800 mg by mouth., Disp: , Rfl:  .  mupirocin ointment (BACTROBAN) 2 %, Dab onto lesion in nose with q-tip up to twice daily. (Patient not taking: Reported on 10/26/2017), Disp: 22 g, Rfl: 0 .  naproxen (NAPROSYN) 375 MG tablet, Take 375 mg by mouth 2 (two) times daily., Disp: , Rfl: 0   Patient Care Team: Maryella Shivers as PCP - General (Physician Assistant)      Objective:   Vitals: BP 110/72 (BP Location: Right Arm, Patient Position: Sitting, Cuff Size: Large)   Pulse 64   Temp 97.8 F (36.6 C) (Oral)   Ht 5\' 4"  (1.626 m)   Wt 193 lb 9.6 oz (87.8 kg)   SpO2 97%   BMI 33.23 kg/m    Vitals:   05/02/18 0914  BP: 110/72  Pulse: 64  Temp: 97.8 F (36.6 C)  TempSrc: Oral  SpO2: 97%  Weight: 193 lb 9.6 oz (87.8 kg)  Height: 5\' 4"  (1.626 m)     Physical Exam  Constitutional: She is oriented to person, place, and time. She appears well-developed and well-nourished.  Cardiovascular: Normal rate and regular rhythm.  Pulmonary/Chest: Effort normal and breath sounds normal.  Breast exam declined, she will get mammogram.   Abdominal: Soft. Bowel sounds are normal.  Neurological: She is alert and oriented to person, place, and time.  Skin: Skin is warm and dry.  Psychiatric: She has a normal mood and affect. Her  behavior is normal.     Depression Screen PHQ 2/9 Scores 05/02/2018 09/28/2017 08/30/2017  PHQ - 2 Score 1 0 6  PHQ- 9 Score 14 8 20       Assessment & Plan:     Routine Health Maintenance and Physical Exam  Exercise Activities and Dietary recommendations Goals   None      There is no immunization history on file for this patient.  Health Maintenance  Topic Date Due  . HIV Screening  06/09/1988  . TETANUS/TDAP  06/09/1992  . MAMMOGRAM  08/16/2017  . INFLUENZA VACCINE  11/29/2018 (Originally 03/01/2018)  . PAP SMEAR  01/27/2020     Discussed health benefits of physical activity, and encouraged her to engage in regular exercise appropriate for her age and condition.    1. Annual physical exam  UTD on PAP, will get mammogram as below.  - MM DIAG BREAST TOMO BILATERAL; Future - Lipid Profile - CBC with Differential - Comprehensive Metabolic Panel (CMET)  2. 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 She has been consulted She wants to pursue surgical options closer to home. Will refer to [facility] Tonny Bollman [facility] for evaluation of this.   Had a LONG conversation with patient about how she absolutely needs to take her Lipitor and ASA daily. This is not something she should discontinue and is very likely not causing her fatigue.   - Ambulatory referral to Cardiology  3. Obesity (BMI 30.0-34.9)  Counseled that gaining weight is generally cause by increased caloric intake and not enough energy expenditure. Keep food log, review in 6 months.   4. Anxiety and depression  She has self discontinued her zoloft. Reports her moods are stable.  5. Encounter for  screening for HIV  - HIV Antibody (routine testing w rflx)  6. Patent foramen ovale  - Ambulatory referral to General Surgery - Ambulatory referral to Cardiology  7. Diarrhea, unspecified type  Symptoms  sound like IBS-D. Offered anti-spasmodic and amitrtiptyline, she declines and says she wants to see a GI doctor.  - Ambulatory referral to Gastroenterology  8. Fatigue, unspecified type  Consider sleep test at next visit.  Return in about 6 months (around 11/01/2018) for weight, depression.  The entirety of the information documented in the History of Present Illness, Review of Systems and Physical Exam were personally obtained by me. Portions of this information were initially documented by Rondel Baton, CMA and reviewed by me for thoroughness and accuracy.    --------------------------------------------------------------------    Trey Sailors, PA-C  [facility]

## 2018-05-02 NOTE — Telephone Encounter (Signed)
Can we let this patient know the surgeon I referred her to initially for her heart does not do the procedure she needs, but there is another surgeon Dr. Tonny Bollman who does do it. I have placed referral for this physician, he is with the cardiology department in [facility] health.

## 2018-05-02 NOTE — Patient Instructions (Signed)

## 2018-05-03 LAB — COMPREHENSIVE METABOLIC PANEL
ALT: 20 IU/L (ref 0–32)
AST: 14 IU/L (ref 0–40)
Albumin/Globulin Ratio: 1.8 (ref 1.2–2.2)
Albumin: 4.3 g/dL (ref 3.5–5.5)
Alkaline Phosphatase: 66 IU/L (ref 39–117)
BUN/Creatinine Ratio: 19 (ref 9–23)
BUN: 13 mg/dL (ref 6–24)
Bilirubin Total: <0.2 mg/dL (ref 0.0–1.2)
CO2: 23 mmol/L (ref 20–29)
Calcium: 9.6 mg/dL (ref 8.7–10.2)
Chloride: 103 mmol/L (ref 96–106)
Creatinine, Ser: 0.68 mg/dL (ref 0.57–1.00)
GFR calc Af Amer: 123 mL/min/{1.73_m2} (ref >59)
GFR calc non Af Amer: 107 mL/min/{1.73_m2} (ref >59)
Globulin, Total: 2.4 g/dL (ref 1.5–4.5)
Glucose: 83 mg/dL (ref 65–99)
Potassium: 4.3 mmol/L (ref 3.5–5.2)
Sodium: 139 mmol/L (ref 134–144)
Total Protein: 6.7 g/dL (ref 6.0–8.5)

## 2018-05-03 LAB — LIPID PANEL
Chol/HDL Ratio: 4.3 ratio (ref 0.0–4.4)
Cholesterol, Total: 190 mg/dL (ref 100–199)
HDL: 44 mg/dL (ref >39)
LDL Calculated: 121 mg/dL — ABNORMAL HIGH (ref 0–99)
Triglycerides: 126 mg/dL (ref 0–149)
VLDL Cholesterol Cal: 25 mg/dL (ref 5–40)

## 2018-05-03 LAB — CBC WITH DIFFERENTIAL/PLATELET
Basophils Absolute: 0.1 10*3/uL (ref 0.0–0.2)
Basos: 1 %
EOS (ABSOLUTE): 0.1 10*3/uL (ref 0.0–0.4)
Eos: 1 %
Hematocrit: 36.8 % (ref 34.0–46.6)
Hemoglobin: 12.3 g/dL (ref 11.1–15.9)
Immature Grans (Abs): 0 10*3/uL (ref 0.0–0.1)
Immature Granulocytes: 0 %
Lymphocytes Absolute: 2.8 10*3/uL (ref 0.7–3.1)
Lymphs: 37 %
MCH: 29 pg (ref 26.6–33.0)
MCHC: 33.4 g/dL (ref 31.5–35.7)
MCV: 87 fL (ref 79–97)
Monocytes Absolute: 0.6 10*3/uL (ref 0.1–0.9)
Monocytes: 7 %
Neutrophils Absolute: 4.1 10*3/uL (ref 1.4–7.0)
Neutrophils: 54 %
Platelets: 288 10*3/uL (ref 150–450)
RBC: 4.24 x10E6/uL (ref 3.77–5.28)
RDW: 12.9 % (ref 12.3–15.4)
WBC: 7.6 10*3/uL (ref 3.4–10.8)

## 2018-05-03 LAB — HIV ANTIBODY (ROUTINE TESTING W REFLEX): HIV Screen 4th Generation wRfx: NONREACTIVE

## 2018-05-04 ENCOUNTER — Telehealth: Payer: Self-pay

## 2018-05-04 NOTE — Telephone Encounter (Signed)
-----   Message from Trey Sailors, [place] sent at 05/03/2018  4:44 PM EDT ----- Her labwork is all normal except for her cholesterol, which is elevated and too high for a person who has had a stroke. As we discussed in clinic, she needs to be taking her lipitor for her cholesterol and also her aspirin.

## 2018-05-04 NOTE — Telephone Encounter (Signed)
[facility]  ED   ----- Message from Trey Sailors, PA-C sent at 05/03/2018  4:44 PM EDT ----- Her labwork is all normal except for her cholesterol, which is elevated and too high for a person who has had a stroke. As we discussed in clinic, she needs to be taking her lipitor for her cholesterol and also her aspirin.

## 2018-05-08 NOTE — Telephone Encounter (Signed)
Patient advised as directed below.  Thanks,  -Lynden Flemmer 

## 2018-06-12 ENCOUNTER — Other Ambulatory Visit: Payer: Self-pay

## 2018-06-12 ENCOUNTER — Encounter: Payer: Self-pay | Admitting: Gastroenterology

## 2018-06-12 ENCOUNTER — Ambulatory Visit (INDEPENDENT_AMBULATORY_CARE_PROVIDER_SITE_OTHER): Payer: BLUE CROSS/BLUE SHIELD | Admitting: Gastroenterology

## 2018-06-12 VITALS — BP 113/78 | HR 61 | Resp 16 | Ht 64.0 in | Wt 188.4 lb

## 2018-06-12 DIAGNOSIS — K529 Noninfective gastroenteritis and colitis, unspecified: Secondary | ICD-10-CM

## 2018-06-12 NOTE — Progress Notes (Signed)
Tammy Repress, MD [address]  [address]  [place], [place] [address]  Main: [phone]  Fax: [phone]    Gastroenterology Consultation  Referring Provider:     Maryella Sosa Primary Care Physician:  Tammy Sosa Primary Gastroenterologist:  Dr. Arlyss Sosa Reason for Consultation:     Chronic diarrhea        HPI:   Tammy Sosa is a 47 y.o. female referred by Dr. Trey Sailors, PA-C  for consultation & management of 2 years of episodes of diarrhea, predominantly postprandial associated with abdominal cramps and bloating.  The symptoms have gotten worse lately associated with about 8 pound weight loss.  Patient was tested negative for celiac disease based on celiac serologies.  TSH is normal.  She denies rectal bleeding, hematochezia.  She reports that her grandmother had colitis. She denies recent travel no sick contacts or heavy NSAID use or regular use of antibiotics She works as a Production assistant, radio in [employer] NSAIDs: Occasional NSAID use for headaches  Antiplts/Anticoagulants/Anti thrombotics: None  GI Procedures: None  Past Medical History:  Diagnosis Date  . Abnormal Papanicolaou smear of cervix with positive human papilloma virus (HPV) test 12/11/2014   LSIL/Mild dysplasia  . Asthma   . Atypical squamous cells of undetermined significance (ASCUS) on Papanicolaou smear of cervix 01/30/2013  . Family history of breast cancer in female   . Family history of ovarian cancer   . H/O arthroscopy of knee   . Herniated cervical disc   . History of conization of cervix   . History of gestational diabetes   . 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 Plantar fasciitis   . Stroke [facility])     Past Surgical History:  Procedure Laterality Date  . CERVICAL CONIZATION W/BX    . KNEE SURGERY Right     Current Outpatient Medications:  .  aspirin EC 81 MG tablet, Take 81 mg by mouth., Disp: , Rfl:  .  atorvastatin (LIPITOR) 10 MG tablet,  Take 1 tablet (10 mg total) by mouth daily., Disp: 90 tablet, Rfl: 3 .  furosemide (LASIX) 40 MG tablet, TAKE 1 TABLET BY MOUTH EVERY DAY IN THE MORNING, Disp: , Rfl: 0 .  ibuprofen (ADVIL,MOTRIN) 800 MG tablet, Take 800 mg by mouth., Disp: , Rfl:  .  levonorgestrel (MIRENA) 20 MCG/24HR IUD, 1 each by Intrauterine route once., Disp: , Rfl:  .  cyclobenzaprine (FLEXERIL) 10 MG tablet, Take by mouth., Disp: , Rfl:  .  diclofenac (VOLTAREN) 50 MG EC tablet, Take 50 mg by mouth 2 (two) times daily as needed., Disp: , Rfl: 0 .  finasteride (PROSCAR) 5 MG tablet, , Disp: , Rfl: 4 .  mupirocin ointment (BACTROBAN) 2 %, Dab onto lesion in nose with q-tip up to twice daily. (Patient not taking: Reported on 10/26/2017), Disp: 22 g, Rfl: 0    Family History  Problem Relation Age of Onset  . Breast cancer Mother 11  . Ovarian cancer Mother 60  . Diabetes Mother   . COPD Mother   . Hypertension Mother   . Skin cancer Father   . Stomach cancer Paternal Uncle   . Stomach cancer Maternal Grandfather   . Diabetes Paternal Grandmother      Social History   Tobacco Use  . Smoking status: Never Smoker  . Smokeless tobacco: Never Used  Substance Use Topics  . Alcohol use: Yes    Alcohol/week: 4.0 standard  drinks    Types: 1 Glasses of wine, 1 Cans of beer, 2 Shots of liquor per week  . Drug use: No    Allergies as of 06/12/2018  . (No Known Allergies)    Review of Systems:    All systems reviewed and negative except where noted in HPI.   Physical Exam:  BP 113/78 (BP Location: Left Arm, Patient Position: Sitting, Cuff Size: Normal)   Pulse 61   Resp 16   Ht 5\' 4"  (1.626 m)   Wt 188 lb 6.4 oz (85.5 kg)   BMI 32.34 kg/m  No LMP recorded. (Menstrual status: IUD).  General:   Alert,  Well-developed, well-nourished, pleasant and cooperative in NAD Head:  Normocephalic and atraumatic. Eyes:  Sclera clear, no icterus.   Conjunctiva pink. Ears:  Normal auditory acuity. Nose:  No deformity,  discharge, or lesions. Mouth:  No deformity or lesions,oropharynx pink & moist. Neck:  Supple; no masses or thyromegaly. Lungs:  Respirations even and unlabored.  Clear throughout to auscultation.   No wheezes, crackles, or rhonchi. No acute distress. Heart:  Regular rate and rhythm; no murmurs, clicks, rubs, or gallops. Abdomen:  Normal bowel sounds. Soft, non-tender and distended, tympanic to percussion without masses, hepatosplenomegaly or hernias noted.  No guarding or rebound tenderness.   Rectal: Not performed Msk:  Symmetrical without gross deformities. Good, equal movement & strength bilaterally. Pulses:  Normal pulses noted. Extremities:  No clubbing or edema.  No cyanosis. Neurologic:  Alert and oriented x3;  grossly normal neurologically. Skin:  Intact without significant lesions or rashes. No jaundice. Lymph Nodes:  No significant cervical adenopathy. Psych:  Alert and cooperative. Normal mood and affect.  Imaging Studies: No abdominal imaging  Assessment and Plan:   Tammy Sosa is a 47 y.o. Caucasian female with obesity, chronic nonbloody diarrhea, postprandial associated with abdominal cramps and bloating.  Celiac serologies negative.  TSH normal  Differentials include microscopic colitis or inflammatory bowel disease or H. pylori infection or bacterial overgrowth or IBS or lactose or fructose or sucrose malabsorption  -Advised her to avoid lactose -Check fecal calprotectin, GI pathogen panel, C. difficile -Perform upper endoscopy and colonoscopy with biopsies   Follow up in 4 weeks   Tammy Repressohini R Brooke Steinhilber, MD

## 2018-06-14 ENCOUNTER — Encounter: Payer: Self-pay | Admitting: *Deleted

## 2018-06-14 ENCOUNTER — Other Ambulatory Visit: Payer: Self-pay

## 2018-06-19 DIAGNOSIS — B9681 Helicobacter pylori [H. pylori] as the cause of diseases classified elsewhere: Secondary | ICD-10-CM | POA: Diagnosis not present

## 2018-06-19 DIAGNOSIS — K2930 Chronic superficial gastritis without bleeding: Secondary | ICD-10-CM | POA: Diagnosis not present

## 2018-06-19 NOTE — Discharge Instructions (Signed)
General Anesthesia, Adult, Care After °These instructions provide you with information about caring for yourself after your procedure. Your health care provider may also give you more specific instructions. Your treatment has been planned according to current medical practices, but problems sometimes occur. Call your health care provider if you have any problems or questions after your procedure. °What can I expect after the procedure? °After the procedure, it is common to have: °· Vomiting. °· A sore throat. °· Mental slowness. ° °It is common to feel: °· Nauseous. °· Cold or shivery. °· Sleepy. °· Tired. °· Sore or achy, even in parts of your body where you did not have surgery. ° °Follow these instructions at home: °For at least 24 hours after the procedure: °· Do not: °? Participate in activities where you could fall or become injured. °? Drive. °? Use heavy machinery. °? Drink alcohol. °? Take sleeping pills or medicines that cause drowsiness. °? Make important decisions or sign legal documents. °? Take care of children on your own. °· Rest. °Eating and drinking °· If you vomit, drink water, juice, or soup when you can drink without vomiting. °· Drink enough fluid to keep your urine clear or pale yellow. °· Make sure you have little or no nausea before eating solid foods. °· Follow the diet recommended by your health care provider. °General instructions °· Have a responsible adult stay with you until you are awake and alert. °· Return to your normal activities as told by your health care provider. Ask your health care provider what activities are safe for you. °· Take over-the-counter and prescription medicines only as told by your health care provider. °· If you smoke, do not smoke without supervision. °· Keep all follow-up visits as told by your health care provider. This is important. °Contact a health care provider if: °· You continue to have nausea or vomiting at home, and medicines are not helpful. °· You  cannot drink fluids or start eating again. °· You cannot urinate after 8-12 hours. °· You develop a skin rash. °· You have fever. °· You have increasing redness at the site of your procedure. °Get help right away if: °· You have difficulty breathing. °· You have chest pain. °· You have unexpected bleeding. °· You feel that you are having a life-threatening or urgent problem. °This information is not intended to replace advice given to you by your health care provider. Make sure you discuss any questions you have with your health care provider. °Document Released: 10/24/2000 Document Revised: 12/21/2015 Document Reviewed: 07/02/2015 °Elsevier Interactive Patient Education © 2018 [employer] ° °

## 2018-06-20 ENCOUNTER — Ambulatory Visit: Payer: BLUE CROSS/BLUE SHIELD | Admitting: Anesthesiology

## 2018-06-20 ENCOUNTER — Ambulatory Visit
Admission: RE | Admit: 2018-06-20 | Discharge: 2018-06-20 | Disposition: A | Discharge status: Home / Self Care | Payer: BLUE CROSS/BLUE SHIELD | Source: Ambulatory Visit | Attending: Gastroenterology | Admitting: Gastroenterology

## 2018-06-20 ENCOUNTER — Encounter
Admission: RE | Disposition: A | Discharge status: Home / Self Care | Payer: Self-pay | Source: Ambulatory Visit | Attending: Gastroenterology

## 2018-06-20 DIAGNOSIS — K529 Noninfective gastroenteritis and colitis, unspecified: Secondary | ICD-10-CM | POA: Diagnosis not present

## 2018-06-20 DIAGNOSIS — K2950 Unspecified chronic gastritis without bleeding: Secondary | ICD-10-CM | POA: Diagnosis not present

## 2018-06-20 DIAGNOSIS — R197 Diarrhea, unspecified: Secondary | ICD-10-CM

## 2018-06-20 DIAGNOSIS — R634 Abnormal weight loss: Secondary | ICD-10-CM | POA: Diagnosis not present

## 2018-06-20 DIAGNOSIS — Z7982 Long term (current) use of aspirin: Secondary | ICD-10-CM | POA: Diagnosis not present

## 2018-06-20 DIAGNOSIS — B9681 Helicobacter pylori [H. pylori] as the cause of diseases classified elsewhere: Secondary | ICD-10-CM | POA: Diagnosis not present

## 2018-06-20 DIAGNOSIS — K449 Diaphragmatic hernia without obstruction or gangrene: Secondary | ICD-10-CM | POA: Diagnosis not present

## 2018-06-20 DIAGNOSIS — R198 Other specified symptoms and signs involving the digestive system and abdomen: Secondary | ICD-10-CM | POA: Diagnosis not present

## 2018-06-20 DIAGNOSIS — Z6832 Body mass index (BMI) 32.0-32.9, adult: Secondary | ICD-10-CM | POA: Diagnosis not present

## 2018-06-20 DIAGNOSIS — Z79899 Other long term (current) drug therapy: Secondary | ICD-10-CM | POA: Diagnosis not present

## 2018-06-20 DIAGNOSIS — Z8673 Personal history of transient ischemic attack (TIA), and cerebral infarction without residual deficits: Secondary | ICD-10-CM | POA: Insufficient documentation

## 2018-06-20 HISTORY — PX: ESOPHAGOGASTRODUODENOSCOPY (EGD) WITH PROPOFOL: SHX5813

## 2018-06-20 HISTORY — DX: Adverse effect of unspecified anesthetic, initial encounter: T41.45XA

## 2018-06-20 HISTORY — DX: Unspecified osteoarthritis, unspecified site: M19.90

## 2018-06-20 HISTORY — DX: Other complications of anesthesia, initial encounter: T88.59XA

## 2018-06-20 HISTORY — PX: COLONOSCOPY WITH PROPOFOL: SHX5780

## 2018-06-20 HISTORY — DX: Presence of spectacles and contact lenses: Z97.3

## 2018-06-20 HISTORY — DX: Motion sickness, initial encounter: T75.3XXA

## 2018-06-20 HISTORY — DX: Patent foramen ovale: Q21.12

## 2018-06-20 HISTORY — DX: Migraine, unspecified, not intractable, without status migrainosus: G43.909

## 2018-06-20 HISTORY — DX: Atrial septal defect: Q21.1

## 2018-06-20 SURGERY — ESOPHAGOGASTRODUODENOSCOPY (EGD) WITH PROPOFOL
Anesthesia: General

## 2018-06-20 SURGERY — ESOPHAGOGASTRODUODENOSCOPY (EGD) WITH PROPOFOL
Anesthesia: General | Site: Throat

## 2018-06-20 MED ORDER — GLYCOPYRROLATE 0.2 MG/ML IJ SOLN
INTRAMUSCULAR | Status: DC | PRN
  Administered 2018-06-20: .2 mg via INTRAVENOUS

## 2018-06-20 MED ORDER — ONDANSETRON HCL 4 MG/2ML IJ SOLN: 4.0000 mg | Freq: Once | INTRAMUSCULAR | Status: DC | PRN

## 2018-06-20 MED ORDER — DEXMEDETOMIDINE HCL 200 MCG/2ML IV SOLN
INTRAVENOUS | Status: DC | PRN
  Administered 2018-06-20: 8 ug via INTRAVENOUS

## 2018-06-20 MED ORDER — SODIUM CHLORIDE 0.9 % IV SOLN: INTRAVENOUS | Status: DC

## 2018-06-20 MED ORDER — PROPOFOL 10 MG/ML IV BOLUS
INTRAVENOUS | Status: DC | PRN
  Administered 2018-06-20 (×2): 20 mg via INTRAVENOUS
  Administered 2018-06-20: 30 mg via INTRAVENOUS
  Administered 2018-06-20: 100 mg via INTRAVENOUS
  Administered 2018-06-20: 20 mg via INTRAVENOUS
  Administered 2018-06-20: 30 mg via INTRAVENOUS
  Administered 2018-06-20 (×4): 20 mg via INTRAVENOUS
  Administered 2018-06-20: 30 mg via INTRAVENOUS
  Administered 2018-06-20 (×2): 20 mg via INTRAVENOUS
  Administered 2018-06-20: 30 mg via INTRAVENOUS
  Administered 2018-06-20: 20 mg via INTRAVENOUS

## 2018-06-20 MED ORDER — LACTATED RINGERS IV SOLN
10.0000 mL/h | INTRAVENOUS | Status: DC
  Administered 2018-06-20: 10 mL/h via INTRAVENOUS

## 2018-06-20 MED ORDER — STERILE WATER FOR IRRIGATION IR SOLN
Status: DC | PRN
  Administered 2018-06-20: 09:00:00

## 2018-06-20 MED ORDER — LIDOCAINE HCL (CARDIAC) PF 100 MG/5ML IV SOSY
PREFILLED_SYRINGE | INTRAVENOUS | Status: DC | PRN
  Administered 2018-06-20: 40 mg via INTRAVENOUS

## 2018-06-20 SURGICAL SUPPLY — 7 items
BLOCK BITE 60FR ADLT L/F GRN (MISCELLANEOUS) ×3 IMPLANT
CANISTER SUCT 1200ML W/VALVE (MISCELLANEOUS) ×3 IMPLANT
FORCEPS BIOP RAD 4 LRG CAP 4 (CUTTING FORCEPS) ×3 IMPLANT
GOWN CVR UNV OPN BCK APRN NK (MISCELLANEOUS) ×4 IMPLANT
GOWN ISOL THUMB LOOP REG UNIV (MISCELLANEOUS) ×2
KIT ENDO PROCEDURE OLY (KITS) ×3 IMPLANT
WATER STERILE IRR 250ML POUR (IV SOLUTION) ×3 IMPLANT

## 2018-06-20 NOTE — Op Note (Addendum)
[facility] Gastroenterology Patient Name: Tammy Sosa Procedure Date: 06/20/2018 8:07 AM MRN: [id number] Account #: [id number] Date of Birth: [birth year] Admit Type: Outpatient Age: 47 Room: [facility] OR ROOM 01 Gender: Female Note Status: Finalized Procedure:            Colonoscopy Indications:          Chronic diarrhea Providers:            Lin Landsman MD, MD Medicines:            Monitored Anesthesia Care Complications:        No immediate complications. Estimated blood loss: None. Procedure:            Pre-Anesthesia Assessment:                       - Prior to the procedure, a History and Physical was                        performed, and patient medications and allergies were                        reviewed. The patient is competent. The risks and                        benefits of the procedure and the sedation options and                        risks were discussed with the patient. All questions                        were answered and informed consent was obtained.                        Patient identification and proposed procedure were                        verified by the physician, the nurse, the                        anesthesiologist, the anesthetist and the technician in                        the pre-procedure area in the procedure room in the                        endoscopy suite. Mental Status Examination: alert and                        oriented. Airway Examination: normal oropharyngeal                        airway and neck mobility. Respiratory Examination:                        clear to auscultation. CV Examination: normal.                        Prophylactic Antibiotics: The patient does not require  prophylactic antibiotics. Prior Anticoagulants: The                        patient has taken no previous anticoagulant or                        antiplatelet agents. ASA Grade Assessment: II - A                         patient with mild systemic disease. After reviewing the                        risks and benefits, the patient was deemed in                        satisfactory condition to undergo the procedure. The                        anesthesia plan was to use monitored anesthesia care                        (MAC). Immediately prior to administration of                        medications, the patient was re-assessed for adequacy                        to receive sedatives. The heart rate, respiratory rate,                        oxygen saturations, blood pressure, adequacy of                        pulmonary ventilation, and response to care were                        monitored throughout the procedure. The physical status                        of the patient was re-assessed after the procedure.                       After obtaining informed consent, the colonoscope was                        passed under direct vision. Throughout the procedure,                        the patient's blood pressure, pulse, and oxygen                        saturations were monitored continuously. The                        Colonoscope was introduced through the anus and                        advanced to the the terminal ileum, with identification  of the appendiceal orifice and IC valve. The                        colonoscopy was performed without difficulty. The                        patient tolerated the procedure well. The quality of                        the bowel preparation was poor. Findings:      The perianal and digital rectal examinations were normal. Pertinent       negatives include normal sphincter tone and no palpable rectal lesions.      Copious quantities of semi-liquid stool was found in the rectum, in the       recto-sigmoid colon, in the sigmoid colon, in the ascending colon and in       the cecum, interfering with visualization. Lavage of the area was       performed  using 50 - 200 mL of sterile water, resulting in clearance       with fair visualization.      Normal mucosa was found in the entire colon. Biopsies for histology were       taken with a cold forceps from the entire colon for evaluation of       microscopic colitis.      The retroflexed view of the distal rectum and anal verge was normal and       showed no anal or rectal abnormalities. Impression:           - Preparation of the colon was poor.                       - Stool in the rectum, in the recto-sigmoid colon, in                        the sigmoid colon, in the ascending colon and in the                        cecum.                       - Normal mucosa in the entire examined colon. Biopsied.                       - The distal rectum and anal verge are normal on                        retroflexion view. Recommendation:       - Discharge patient to home (with spouse).                       - Resume previous diet today.                       - Continue present medications.                       - Await pathology results.                       - Repeat colonoscopy in 5 years for screening purposes.                       -  Return to my office as previously scheduled. Procedure Code(s):    --- Professional ---                       [phone], Colonoscopy, flexible; with biopsy, single or                        multiple Diagnosis Code(s):    --- Professional ---                       K52.9, Noninfective gastroenteritis and colitis,                        unspecified CPT copyright 2018 American Medical Association. All rights reserved. The codes documented in this report are preliminary and upon coder review may  be revised to meet current compliance requirements. Dr. Ulyess Mort Lin Landsman MD, MD 06/20/2018 9:00:48 AM This report has been signed electronically. Number of Addenda: 0 Note Initiated On: 06/20/2018 8:07 AM Scope Withdrawal Time: 0 hours 9 minutes 0 seconds   Total Procedure Duration: 0 hours 12 minutes 9 seconds       [facility]

## 2018-06-20 NOTE — Anesthesia Preprocedure Evaluation (Signed)
Anesthesia Evaluation  Patient identified by MRN, date of birth, ID band  Reviewed: NPO status   History of Anesthesia Complications (+) history of anesthetic complications (bp drops)  Airway Mallampati: II  TM Distance: >3 FB Neck ROM: full    Dental no notable dental hx.    Pulmonary neg pulmonary ROS,    Pulmonary exam normal        Cardiovascular Exercise Tolerance: Good Normal cardiovascular exam  PFO   Neuro/Psych  Headaches, PSYCHIATRIC DISORDERS Anxiety Depression CVA (2017), No Residual Symptoms    GI/Hepatic negative GI ROS, Neg liver ROS,   Endo/Other  negative endocrine ROS  Renal/GU negative Renal ROS  negative genitourinary   Musculoskeletal  (+) Arthritis ,   Abdominal   Peds  Hematology negative hematology ROS (+)   Anesthesia Other Findings echo: 2017: Left ventricle: Systolic function was vigorous. The estimated   ejection fraction was 60%. Wall motion was normal; there were no   regional wall motion abnormalities.;  ASD, with positive bubble study, no thrombii seen in left   atrrium, left ventricle and left atrial appendage. Advise repair   of ASD in 6-8 weeks and advise anticoagulation. Normal LVEF.  Reproductive/Obstetrics negative OB ROS                             Anesthesia Physical Anesthesia Plan  ASA: II  Anesthesia Plan: General   Post-op Pain Management:    Induction:   PONV Risk Score and Plan:   Airway Management Planned: Natural Airway  Additional Equipment:   Intra-op Plan:   Post-operative Plan:   Informed Consent: I have reviewed the patients History and Physical, chart, labs and discussed the procedure including the risks, benefits and alternatives for the proposed anesthesia with the patient or authorized representative who has indicated his/her understanding and acceptance.     Plan Discussed with: CRNA  Anesthesia Plan Comments:          Anesthesia Quick Evaluation

## 2018-06-20 NOTE — Op Note (Addendum)
[facility] Gastroenterology Patient Name: Tammy Sosa Procedure Date: 06/20/2018 8:09 AM MRN: [id number] Account #: [id number] Date of Birth: [birth year] Admit Type: Outpatient Age: 47 Room: [facility] OR ROOM 01 Gender: Female Note Status: Finalized Procedure:            Upper GI endoscopy Indications:          Diarrhea, Weight loss Providers:            Lin Landsman MD, MD Referring MD:         Wendee Beavers. Terrilee Croak (Referring MD) Medicines:            Monitored Anesthesia Care Complications:        No immediate complications. Estimated blood loss: None. Procedure:            Pre-Anesthesia Assessment:                       - Prior to the procedure, a History and Physical was                        performed, and patient medications and allergies were                        reviewed. The patient is competent. The risks and                        benefits of the procedure and the sedation options and                        risks were discussed with the patient. All questions                        were answered and informed consent was obtained.                        Patient identification and proposed procedure were                        verified by the physician, the nurse, the                        anesthesiologist, the anesthetist and the technician in                        the pre-procedure area in the procedure room in the                        endoscopy suite. Mental Status Examination: alert and                        oriented. Airway Examination: normal oropharyngeal                        airway and neck mobility. Respiratory Examination:                        clear to auscultation. CV Examination: normal.                        Prophylactic Antibiotics: The patient does not require  prophylactic antibiotics. Prior Anticoagulants: The                        patient has taken no previous anticoagulant or   antiplatelet agents. ASA Grade Assessment: II - A                        patient with mild systemic disease. After reviewing the                        risks and benefits, the patient was deemed in                        satisfactory condition to undergo the procedure. The                        anesthesia plan was to use monitored anesthesia care                        (MAC). Immediately prior to administration of                        medications, the patient was re-assessed for adequacy                        to receive sedatives. The heart rate, respiratory rate,                        oxygen saturations, blood pressure, adequacy of                        pulmonary ventilation, and response to care were                        monitored throughout the procedure. The physical status                        of the patient was re-assessed after the procedure.                       After obtaining informed consent, the endoscope was                        passed under direct vision. Throughout the procedure,                        the patient's blood pressure, pulse, and oxygen                        saturations were monitored continuously. The                        Endosonoscope was introduced through the mouth, and                        advanced to the third part of duodenum. The upper GI                        endoscopy was accomplished without difficulty. The  patient tolerated the procedure well. Findings:      The duodenal bulb, second portion of the duodenum and third portion of       the duodenum were normal. Biopsies for histology were taken with a cold       forceps for evaluation of celiac disease. Biopsies for histology were       taken with a cold forceps for evaluation of celiac disease.      A small hiatal hernia was present.      The entire examined stomach was normal. Biopsies were taken with a cold       forceps for Helicobacter pylori testing.       The cardia and gastric fundus were normal on retroflexion.      The gastroesophageal junction and examined esophagus were normal. Impression:           - Normal duodenal bulb, second portion of the duodenum                        and third portion of the duodenum. Biopsied.                       - Small hiatal hernia.                       - Normal stomach. Biopsied.                       - Normal gastroesophageal junction and esophagus. Recommendation:       - Await pathology results.                       - Proceed with colonoscopy as scheduled                       See colonoscopy report Procedure Code(s):    --- Professional ---                       [phone], Esophagogastroduodenoscopy, flexible, transoral;                        with biopsy, single or multiple Diagnosis Code(s):    --- Professional ---                       K44.9, Diaphragmatic hernia without obstruction or                        gangrene                       R19.7, Diarrhea, unspecified                       R63.4, Abnormal weight loss CPT copyright 2018 American Medical Association. All rights reserved. The codes documented in this report are preliminary and upon coder review may  be revised to meet current compliance requirements. Dr. Ulyess Mort Lin Landsman MD, MD 06/20/2018 8:42:11 AM This report has been signed electronically. Number of Addenda: 0 Note Initiated On: 06/20/2018 8:09 AM Total Procedure Duration: 0 hours 4 minutes 43 seconds       [facility]

## 2018-06-20 NOTE — Anesthesia Postprocedure Evaluation (Signed)
Anesthesia Post Note  Patient: Tammy Sosa  Procedure(s) Performed: ESOPHAGOGASTRODUODENOSCOPY (EGD) WITH PROPOFOL (N/A Throat) COLONOSCOPY WITH PROPOFOL (N/A Rectum)  Patient location during evaluation: PACU Anesthesia Type: General Level of consciousness: awake and alert Pain management: pain level controlled Vital Signs Assessment: post-procedure vital signs reviewed and stable Respiratory status: spontaneous breathing, nonlabored ventilation, respiratory function stable and patient connected to nasal cannula oxygen Cardiovascular status: blood pressure returned to baseline and stable Postop Assessment: no apparent nausea or vomiting Anesthetic complications: no    Kojo Liby

## 2018-06-20 NOTE — Transfer of Care (Signed)
Immediate Anesthesia Transfer of Care Note  Patient: Anari R Ricciardi  Procedure(s) Performed: ESOPHAGOGASTRODUODENOSCOPY (EGD) WITH PROPOFOL (N/A Throat) COLONOSCOPY WITH PROPOFOL (N/A Rectum)  Patient Location: PACU  Anesthesia Type: General  Level of Consciousness: awake, alert  and patient cooperative  Airway and Oxygen Therapy: Patient Spontanous Breathing and Patient connected to supplemental oxygen  Post-op Assessment: Post-op Vital signs reviewed, Patient's Cardiovascular Status Stable, Respiratory Function Stable, Patent Airway and No signs of Nausea or vomiting  Post-op Vital Signs: Reviewed and stable  Complications: No apparent anesthesia complications

## 2018-06-20 NOTE — Anesthesia Procedure Notes (Signed)
Date/Time: 06/20/2018 8:30 AM Performed by: Maryan RuedWilson, Kayelynn Abdou M, CRNA Pre-anesthesia Checklist: Patient identified, Emergency Drugs available, Suction available, Timeout performed and Patient being monitored Patient Re-evaluated:Patient Re-evaluated prior to induction Oxygen Delivery Method: Nasal cannula Placement Confirmation: positive ETCO2

## 2018-06-20 NOTE — H&P (Signed)
Arlyss Repressohini R Vanga, MD [address]  [address]  [place], [place] [address]  Main: [phone]  Fax: [phone] Pager: [phone]  Primary Care Physician:  Trey SailorsPollak, Adriana M, PA-C Primary Gastroenterologist:  Dr. Arlyss Repressohini R Vanga  Pre-Procedure History & Physical: HPI:  Tammy Sosa is a 47 y.o. female is here for an endoscopy and colonoscopy.   Past Medical History:  Diagnosis Date  . Abnormal Papanicolaou smear of cervix with positive human papilloma virus (HPV) test 12/11/2014   LSIL/Mild dysplasia  . Arthritis    hips  . Asthma    as young adult  . Atypical squamous cells of undetermined significance (ASCUS) on Papanicolaou smear of cervix 01/30/2013  . Complication of anesthesia    BP drops  . Family history of breast cancer in female   . Family history of ovarian cancer   . H/O arthroscopy of knee   . Herniated cervical disc   . History of conization of cervix   . History of gestational diabetes   . 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 Migraine headache    none in over 1 yr  . Motion sickness    passenger in car  . PFO (patent foramen ovale)   . Plantar fasciitis   . Stroke (HCC) 2017  . Wears contact lenses     Past Surgical History:  Procedure Laterality Date  . CERVICAL CONIZATION W/BX    . KNEE SURGERY Right     Prior to Admission medications   Medication Sig Start Date End Date Taking? Authorizing Provider  aspirin EC 81 MG tablet Take 81 mg by mouth.   Yes [provider]  atorvastatin (LIPITOR) 10 MG tablet Take 1 tablet (10 mg total) by mouth daily. 08/28/17  Yes Trey SailorsPollak, Adriana M, PA-C  finasteride (PROSCAR) 5 MG tablet  06/09/18  Yes [provider]  levonorgestrel (MIRENA) 20 MCG/24HR IUD 1 each by Intrauterine route once.   Yes [provider]  sertraline (ZOLOFT) 100 MG tablet Take 100 mg by mouth daily.   Yes [provider]  cyclobenzaprine (FLEXERIL) 10 MG tablet Take by mouth. 08/29/17    [provider]    Allergies as of 06/12/2018  . (No Known Allergies)    Family History  Problem Relation Age of Onset  . Breast cancer Mother 1355  . Ovarian cancer Mother 5830  . Diabetes Mother   . COPD Mother   . Hypertension Mother   . Skin cancer Father   . Stomach cancer Paternal Uncle   . Stomach cancer Maternal Grandfather   . Diabetes Paternal Grandmother     Social History   Socioeconomic History  . Marital status: Married    Spouse name: Not on file  . Number of children: Not on file  . Years of education: Not on file  . Highest education level: Not on file  Occupational History  . Not on file  Social Needs  . Financial resource strain: Not on file  . Food insecurity:    Worry: Not on file    Inability: Not on file  . Transportation needs:    Medical: Not on file    Non-medical: Not on file  Tobacco Use  . Smoking status: Never Smoker  . Smokeless tobacco: Never Used  Substance and Sexual Activity  . Alcohol use: Yes    Alcohol/week: 4.0 standard drinks    Types: 1 Glasses of wine, 1 Cans of beer, 2 Shots of liquor per  week  . Drug use: No  . Sexual activity: Yes    Birth control/protection: IUD  Lifestyle  . Physical activity:    Days per week: Not on file    Minutes per session: Not on file  . Stress: Not on file  Relationships  . Social connections:    Talks on phone: Not on file    Gets together: Not on file    Attends religious service: Not on file    Active member of club or organization: Not on file    Attends meetings of clubs or organizations: Not on file    Relationship status: Not on file  . Intimate partner violence:    Fear of current or ex partner: Not on file    Emotionally abused: Not on file    Physically abused: Not on file    Forced sexual activity: Not on file  Other Topics Concern  . Not on file  Social History Narrative  . Not on file    Review of Systems: See HPI, otherwise negative ROS  Physical  Exam: BP 110/68   Pulse (!) 54   Temp 98.6 F (37 C) (Temporal)   Resp 14   Ht 5\' 4"  (1.626 m)   Wt 84.8 kg   LMP 06/02/2018 (Exact Date) Comment: negative preg test  SpO2 98%   BMI 32.10 kg/m  General:   Alert,  pleasant and cooperative in NAD Head:  Normocephalic and atraumatic. Neck:  Supple; no masses or thyromegaly. Lungs:  Clear throughout to auscultation.    Heart:  Regular rate and rhythm. Abdomen:  Soft, nontender and nondistended. Normal bowel sounds, without guarding, and without rebound.   Neurologic:  Alert and  oriented x4;  grossly normal neurologically.  Impression/Plan: Tammy Sosa is here for an endoscopy and colonoscopy to be performed for chronic diarrhea  Risks, benefits, limitations, and alternatives regarding  endoscopy and colonoscopy have been reviewed with the patient.  Questions have been answered.  All parties agreeable.   Lannette Donath, MD  06/20/2018, 8:17 AM

## 2018-06-21 ENCOUNTER — Encounter: Payer: Self-pay | Admitting: Gastroenterology

## 2018-06-22 ENCOUNTER — Encounter: Payer: Self-pay | Admitting: Gastroenterology

## 2018-06-22 ENCOUNTER — Other Ambulatory Visit: Payer: Self-pay

## 2018-06-22 DIAGNOSIS — A048 Other specified bacterial intestinal infections: Secondary | ICD-10-CM

## 2018-06-22 MED ORDER — AMOXICILLIN 500 MG PO TABS: 500.0000 mg | ORAL_TABLET | Freq: Two times a day (BID) | ORAL | 0 refills | Status: AC

## 2018-06-22 MED ORDER — CLARITHROMYCIN 250 MG PO TABS: 250.0000 mg | ORAL_TABLET | Freq: Two times a day (BID) | ORAL | 0 refills | Status: AC

## 2018-06-22 MED ORDER — OMEPRAZOLE 40 MG PO CPDR: 40.0000 mg | DELAYED_RELEASE_CAPSULE | Freq: Two times a day (BID) | ORAL | 0 refills | Status: DC

## 2018-07-10 ENCOUNTER — Encounter: Payer: Self-pay | Admitting: Gastroenterology

## 2018-07-10 ENCOUNTER — Ambulatory Visit (INDEPENDENT_AMBULATORY_CARE_PROVIDER_SITE_OTHER): Payer: BLUE CROSS/BLUE SHIELD | Admitting: Gastroenterology

## 2018-07-10 VITALS — BP 106/73 | HR 64 | Resp 16 | Ht 64.0 in | Wt 186.8 lb

## 2018-07-10 DIAGNOSIS — R1013 Epigastric pain: Secondary | ICD-10-CM

## 2018-07-10 DIAGNOSIS — B9681 Helicobacter pylori [H. pylori] as the cause of diseases classified elsewhere: Secondary | ICD-10-CM

## 2018-07-10 DIAGNOSIS — A048 Other specified bacterial intestinal infections: Secondary | ICD-10-CM | POA: Insufficient documentation

## 2018-07-10 DIAGNOSIS — K2970 Gastritis, unspecified, without bleeding: Secondary | ICD-10-CM | POA: Diagnosis not present

## 2018-07-10 NOTE — Progress Notes (Signed)
Arlyss Repressohini R Daryon Remmert, MD [address]  [address]  [place], [place] [address]  Main: [phone]  Fax: [phone]    Gastroenterology Consultation  Referring Provider:     Trey SailorsPollak, Adriana M, PA-C Primary Care Physician:  Trey SailorsPollak, Adriana M, PA-C Primary Gastroenterologist:  Dr. Arlyss Repressohini R Josalynn Johndrow Reason for Consultation: H. pylori gastritis        HPI:   Tammy Pontiffngelic R Sosa is a 47 y.o. female referred by Dr. Trey SailorsPollak, Adriana M, PA-C  for consultation & management of 2 years of episodes of diarrhea, predominantly postprandial associated with abdominal cramps and bloating.  The symptoms have gotten worse lately associated with about 8 pound weight loss.  Patient was tested negative for celiac disease based on celiac serologies.  TSH is normal.  She denies rectal bleeding, hematochezia.  She reports that her grandmother had colitis. She denies recent travel no sick contacts or heavy NSAID use or regular use of antibiotics She works as a Production assistant, radioserver in [employer]  Follow-up visit 07/10/2018  Patient underwent EGD and colonoscopy and was found to have H. pylori gastritis only.  She finished 2 weeks course of triple therapy last week.  She reports that her symptoms have improved but not resolved.  She no longer has postprandial diarrhea or cramps.  She did notice that eating red meat leads to worsening of the symptoms.  She drinks 1 small can of soda daily.  She is trying to lose weight.  NSAIDs: Occasional NSAID use for headaches  Antiplts/Anticoagulants/Anti thrombotics: None  GI Procedures:  EGD 06/20/2018 - Normal duodenal bulb, second portion of the duodenum and third portion of the duodenum. Biopsied. - Small hiatal hernia. - Normal stomach. Biopsied. - Normal gastroesophageal junction and esophagus.  Colonoscopy 06/20/2018 - Preparation of the colon was poor. - Stool in the rectum, in the recto-sigmoid colon, in the sigmoid colon, in the ascending colon and in the cecum. - Normal  mucosa in the entire examined colon. Biopsied. - The distal rectum and anal verge are normal on retroflexion view.     Past Medical History:  Diagnosis Date  . Abnormal Papanicolaou smear of cervix with positive human papilloma virus (HPV) test 12/11/2014   LSIL/Mild dysplasia  . Arthritis    hips  . Asthma    as young adult  . Atypical squamous cells of undetermined significance (ASCUS) on Papanicolaou smear of cervix 01/30/2013  . Complication of anesthesia    BP drops  . Family history of breast cancer in female   . Family history of ovarian cancer   . H/O arthroscopy of knee   . Herniated cervical disc   . History of conization of cervix   . History of gestational diabetes   . 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 Migraine headache    none in over 1 yr  . Motion sickness    passenger in car  . PFO (patent foramen ovale)   . Plantar fasciitis   . Stroke (HCC) 2017  . Wears contact lenses     Past Surgical History:  Procedure Laterality Date  . CERVICAL CONIZATION W/BX    . COLONOSCOPY WITH PROPOFOL N/A 06/20/2018   Procedure: COLONOSCOPY WITH PROPOFOL;  Surgeon: Toney ReilVanga, Braelee Herrle Reddy, MD;  [facility];  Service: Endoscopy;  Laterality: N/A;  . ESOPHAGOGASTRODUODENOSCOPY (EGD) WITH PROPOFOL N/A 06/20/2018   Procedure: ESOPHAGOGASTRODUODENOSCOPY (EGD) WITH PROPOFOL;  Surgeon: Toney ReilVanga, Charan Prieto Reddy, MD;  [facility];  Service: Endoscopy;  Laterality:  N/A;  . KNEE SURGERY Right     Current Outpatient Medications:  .  aspirin EC 81 MG tablet, Take 81 mg by mouth., Disp: , Rfl:  .  atorvastatin (LIPITOR) 10 MG tablet, Take 1 tablet (10 mg total) by mouth daily., Disp: 90 tablet, Rfl: 3 .  finasteride (PROSCAR) 5 MG tablet, , Disp: , Rfl: 4 .  levonorgestrel (MIRENA) 20 MCG/24HR IUD, 1 each by Intrauterine route once., Disp: , Rfl:  .  sertraline (ZOLOFT) 100 MG tablet, Take 100 mg by mouth daily., Disp: , Rfl:  .  cyclobenzaprine  (FLEXERIL) 10 MG tablet, Take by mouth., Disp: , Rfl:  .  omeprazole (PRILOSEC) 40 MG capsule, Take 1 capsule (40 mg total) by mouth 2 (two) times daily for 14 days., Disp: 28 capsule, Rfl: 0    Family History  Problem Relation Age of Onset  . Breast cancer Mother 26  . Ovarian cancer Mother 48  . Diabetes Mother   . COPD Mother   . Hypertension Mother   . Skin cancer Father   . Stomach cancer Paternal Uncle   . Stomach cancer Maternal Grandfather   . Diabetes Paternal Grandmother      Social History   Tobacco Use  . Smoking status: Never Smoker  . Smokeless tobacco: Never Used  Substance Use Topics  . Alcohol use: Yes    Alcohol/week: 4.0 standard drinks    Types: 1 Glasses of wine, 1 Cans of beer, 2 Shots of liquor per week  . Drug use: No    Allergies as of 07/10/2018  . (No Known Allergies)    Review of Systems:    All systems reviewed and negative except where noted in HPI.   Physical Exam:  BP 106/73 (BP Location: Left Arm, Patient Position: Sitting, Cuff Size: Large)   Pulse 64   Resp 16   Ht 5\' 4"  (1.626 m)   Wt 186 lb 12.8 oz (84.7 kg)   BMI 32.06 kg/m  No LMP recorded. (Menstrual status: IUD).  General:   Alert,  Well-developed, well-nourished, pleasant and cooperative in NAD Head:  Normocephalic and atraumatic. Eyes:  Sclera clear, no icterus.   Conjunctiva pink. Ears:  Normal auditory acuity. Nose:  No deformity, discharge, or lesions. Mouth:  No deformity or lesions,oropharynx pink & moist. Neck:  Supple; no masses or thyromegaly. Lungs:  Respirations even and unlabored.  Clear throughout to auscultation.   No wheezes, crackles, or rhonchi. No acute distress. Heart:  Regular rate and rhythm; no murmurs, clicks, rubs, or gallops. Abdomen:  Normal bowel sounds. Soft, non-tender and distended, tympanic to percussion without masses, hepatosplenomegaly or hernias noted.  No guarding or rebound tenderness.   Rectal: Not performed Msk:  Symmetrical  without gross deformities. Good, equal movement & strength bilaterally. Pulses:  Normal pulses noted. Extremities:  No clubbing or edema.  No cyanosis. Neurologic:  Alert and oriented x3;  grossly normal neurologically. Skin:  Intact without significant lesions or rashes. No jaundice. Lymph Nodes:  No significant cervical adenopathy. Psych:  Alert and cooperative. Normal mood and affect.  Imaging Studies: No abdominal imaging  Assessment and Plan:   Tammy Sosa is a 47 y.o. Caucasian female with obesity, here for follow-up of chronic nonbloody diarrhea, postprandial associated with abdominal cramps and bloating.  Celiac serologies negative.  TSH normal EGD and colonoscopy were performed.  EGD revealed H. pylori gastritis which can explain her symptoms, status post triple therapy with slow improvement in her symptoms.  H. pylori infection: Perform H. pylori breath test in 4 weeks, treat with Pylera if positive Advised her to avoid red meat, white bread and fatty foods Lose weight following healthy diet and exercise if possible If her symptoms are persisting, will perform repeat EGD   Follow up as needed   Arlyss Repress, MD

## 2018-11-01 ENCOUNTER — Ambulatory Visit: Payer: Self-pay | Admitting: Physician Assistant

## 2019-01-25 DIAGNOSIS — R079 Chest pain, unspecified: Secondary | ICD-10-CM | POA: Diagnosis not present

## 2019-01-25 DIAGNOSIS — Z79899 Other long term (current) drug therapy: Secondary | ICD-10-CM | POA: Diagnosis not present

## 2019-01-25 DIAGNOSIS — R0789 Other chest pain: Secondary | ICD-10-CM | POA: Diagnosis not present

## 2019-01-25 DIAGNOSIS — R600 Localized edema: Secondary | ICD-10-CM | POA: Diagnosis not present

## 2019-01-25 DIAGNOSIS — M79602 Pain in left arm: Secondary | ICD-10-CM | POA: Diagnosis not present

## 2019-01-25 DIAGNOSIS — Z7982 Long term (current) use of aspirin: Secondary | ICD-10-CM | POA: Diagnosis not present

## 2019-01-25 DIAGNOSIS — Z8673 Personal history of transient ischemic attack (TIA), and cerebral infarction without residual deficits: Secondary | ICD-10-CM | POA: Diagnosis not present

## 2019-01-28 ENCOUNTER — Ambulatory Visit (INDEPENDENT_AMBULATORY_CARE_PROVIDER_SITE_OTHER): Payer: Self-pay | Admitting: Physician Assistant

## 2019-01-28 DIAGNOSIS — R079 Chest pain, unspecified: Secondary | ICD-10-CM

## 2019-01-28 DIAGNOSIS — I6349 Cerebral infarction due to embolism of other cerebral artery: Secondary | ICD-10-CM

## 2019-01-28 NOTE — Progress Notes (Signed)
Subjective:    Patient ID: Tammy Sosa, female    DOB: 03/26/1971, 48 y.o.   MRN: [id number]  Tammy Sosa is a 48 y.o. female presenting on 01/28/2019 for No chief complaint on file.  Virtual Visit via Telephone Note  I connected with Tammy Sosa on 01/29/19 at  4:00 PM EDT by telephone and verified that I am speaking with the correct person using two identifiers.   I discussed the limitations, risks, security and privacy concerns of performing an evaluation and management service by telephone and the availability of in person appointments. I also discussed with the patient that there may be a patient responsible charge related to this service. The patient expressed understanding and agreed to proceed.  Patient location: home Provider [facility]  Persons involved in the visit: patient, provider   HPI   Patient with a history of CVA 2/2 PFO presents today after ER visit for chest pain. Had episode of chest pain and elevated BP at work, was seen in the ER with normal troponins, EKG, and CXR. Not felt to be having a stroke of PE. She reports improvement in symptoms today. She is requesting a referral to cardiology.   Referral for cardiology was originally placed in 10.2019. Referral status shoes 2 failed attempts to contact patient and message left as of 06/05/2018. Letter was sent. Patient reports she does not recall being contacted.   Social History   Tobacco Use  . Smoking status: Never Smoker  . Smokeless tobacco: Never Used  Substance Use Topics  . Alcohol use: Yes    Alcohol/week: 4.0 standard drinks    Types: 1 Glasses of wine, 1 Cans of beer, 2 Shots of liquor per week  . Drug use: No    Review of Systems Per HPI unless specifically indicated above     Objective:    There were no vitals taken for this visit.  Wt Readings from Last 3 Encounters:  07/10/18 186 lb 12.8 oz (84.7 kg)  06/20/18 187 lb (84.8 kg)  06/12/18 188  lb 6.4 oz (85.5 kg)    Physical Exam Pulmonary:     Comments: Speaking in full sentences without pauses.    Results for orders placed or performed in visit on 05/02/18  HIV Antibody (routine testing w rflx)  Result Value Ref Range   HIV Screen 4th Generation wRfx Non Reactive Non Reactive  Lipid Profile  Result Value Ref Range   Cholesterol, Total 190 100 - 199 mg/dL   Triglycerides 409126 0 - 149 mg/dL   HDL 44 >81>39 mg/dL   VLDL Cholesterol Cal 25 5 - 40 mg/dL   LDL Calculated 191121 (H) 0 - 99 mg/dL   Chol/HDL Ratio 4.3 0.0 - 4.4 ratio  CBC with Differential  Result Value Ref Range   WBC 7.6 3.4 - 10.8 x10E3/uL   RBC 4.24 3.77 - 5.28 x10E6/uL   Hemoglobin 12.3 11.1 - 15.9 g/dL   Hematocrit 47.836.8 29.534.0 - 46.6 %   MCV 87 79 - 97 fL   MCH 29.0 26.6 - 33.0 pg   MCHC 33.4 31.5 - 35.7 g/dL   RDW 62.112.9 30.812.3 - 65.715.4 %   Platelets 288 150 - 450 x10E3/uL   Neutrophils 54 Not Estab. %   Lymphs 37 Not Estab. %   Monocytes 7 Not Estab. %   Eos 1 Not Estab. %   Basos 1 Not Estab. %   Neutrophils Absolute 4.1 1.4 - 7.0  x10E3/uL   Lymphocytes Absolute 2.8 0.7 - 3.1 x10E3/uL   Monocytes Absolute 0.6 0.1 - 0.9 x10E3/uL   EOS (ABSOLUTE) 0.1 0.0 - 0.4 x10E3/uL   Basophils Absolute 0.1 0.0 - 0.2 x10E3/uL   Immature Granulocytes 0 Not Estab. %   Immature Grans (Abs) 0.0 0.0 - 0.1 x10E3/uL  Comprehensive Metabolic Panel (CMET)  Result Value Ref Range   Glucose 83 65 - 99 mg/dL   BUN 13 6 - 24 mg/dL   Creatinine, Ser 0.68 0.57 - 1.00 mg/dL   GFR calc non Af Amer 107 >59 mL/min/1.73   GFR calc Af Amer 123 >59 mL/min/1.73   BUN/Creatinine Ratio 19 9 - 23   Sodium 139 134 - 144 mmol/L   Potassium 4.3 3.5 - 5.2 mmol/L   Chloride 103 96 - 106 mmol/L   CO2 23 20 - 29 mmol/L   Calcium 9.6 8.7 - 10.2 mg/dL   Total Protein 6.7 6.0 - 8.5 g/dL   Albumin 4.3 3.5 - 5.5 g/dL   Globulin, Total 2.4 1.5 - 4.5 g/dL   Albumin/Globulin Ratio 1.8 1.2 - 2.2   Bilirubin Total <0.2 0.0 - 1.2 mg/dL   Alkaline  Phosphatase 66 39 - 117 IU/L   AST 14 0 - 40 IU/L   ALT 20 0 - 32 IU/L      Assessment & Plan:  1. 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 Instructed to clear her voicemail.   - Ambulatory referral to Cardiology  2. Chest pain, unspecified type  - Ambulatory referral to Cardiology    Follow up plan: No follow-ups on file.  Carles Collet, PA-C [facility] 01/28/2019, 4:22 PM

## 2019-01-29 NOTE — Patient Instructions (Signed)

## 2019-01-31 DIAGNOSIS — E6609 Other obesity due to excess calories: Secondary | ICD-10-CM | POA: Diagnosis not present

## 2019-01-31 DIAGNOSIS — Z6834 Body mass index (BMI) 34.0-34.9, adult: Secondary | ICD-10-CM | POA: Diagnosis not present

## 2019-01-31 DIAGNOSIS — Z8673 Personal history of transient ischemic attack (TIA), and cerebral infarction without residual deficits: Secondary | ICD-10-CM | POA: Diagnosis not present

## 2019-01-31 DIAGNOSIS — Z713 Dietary counseling and surveillance: Secondary | ICD-10-CM | POA: Diagnosis not present

## 2019-02-07 DIAGNOSIS — Z713 Dietary counseling and surveillance: Secondary | ICD-10-CM | POA: Diagnosis not present

## 2019-02-07 DIAGNOSIS — Z6834 Body mass index (BMI) 34.0-34.9, adult: Secondary | ICD-10-CM | POA: Diagnosis not present

## 2019-02-07 DIAGNOSIS — F330 Major depressive disorder, recurrent, mild: Secondary | ICD-10-CM | POA: Diagnosis not present

## 2019-02-07 DIAGNOSIS — E6609 Other obesity due to excess calories: Secondary | ICD-10-CM | POA: Diagnosis not present

## 2019-02-14 DIAGNOSIS — Z713 Dietary counseling and surveillance: Secondary | ICD-10-CM | POA: Diagnosis not present

## 2019-02-14 DIAGNOSIS — Z8673 Personal history of transient ischemic attack (TIA), and cerebral infarction without residual deficits: Secondary | ICD-10-CM | POA: Diagnosis not present

## 2019-02-14 DIAGNOSIS — Z6834 Body mass index (BMI) 34.0-34.9, adult: Secondary | ICD-10-CM | POA: Diagnosis not present

## 2019-02-14 DIAGNOSIS — E6609 Other obesity due to excess calories: Secondary | ICD-10-CM | POA: Diagnosis not present

## 2019-02-21 DIAGNOSIS — Z713 Dietary counseling and surveillance: Secondary | ICD-10-CM | POA: Diagnosis not present

## 2019-02-21 DIAGNOSIS — Z6833 Body mass index (BMI) 33.0-33.9, adult: Secondary | ICD-10-CM | POA: Diagnosis not present

## 2019-02-21 DIAGNOSIS — E6609 Other obesity due to excess calories: Secondary | ICD-10-CM | POA: Diagnosis not present

## 2019-02-21 DIAGNOSIS — F330 Major depressive disorder, recurrent, mild: Secondary | ICD-10-CM | POA: Diagnosis not present

## 2019-02-28 DIAGNOSIS — Z6833 Body mass index (BMI) 33.0-33.9, adult: Secondary | ICD-10-CM | POA: Diagnosis not present

## 2019-02-28 DIAGNOSIS — Z8673 Personal history of transient ischemic attack (TIA), and cerebral infarction without residual deficits: Secondary | ICD-10-CM | POA: Diagnosis not present

## 2019-02-28 DIAGNOSIS — Z713 Dietary counseling and surveillance: Secondary | ICD-10-CM | POA: Diagnosis not present

## 2019-02-28 DIAGNOSIS — E6609 Other obesity due to excess calories: Secondary | ICD-10-CM | POA: Diagnosis not present

## 2019-03-14 DIAGNOSIS — E6609 Other obesity due to excess calories: Secondary | ICD-10-CM | POA: Diagnosis not present

## 2019-03-14 DIAGNOSIS — Z713 Dietary counseling and surveillance: Secondary | ICD-10-CM | POA: Diagnosis not present

## 2019-03-14 DIAGNOSIS — Z6833 Body mass index (BMI) 33.0-33.9, adult: Secondary | ICD-10-CM | POA: Diagnosis not present

## 2019-03-14 DIAGNOSIS — Z8673 Personal history of transient ischemic attack (TIA), and cerebral infarction without residual deficits: Secondary | ICD-10-CM | POA: Diagnosis not present

## 2019-03-25 ENCOUNTER — Telehealth: Payer: Self-pay

## 2019-03-28 DIAGNOSIS — Z713 Dietary counseling and surveillance: Secondary | ICD-10-CM | POA: Diagnosis not present

## 2019-03-28 DIAGNOSIS — E6609 Other obesity due to excess calories: Secondary | ICD-10-CM | POA: Diagnosis not present

## 2019-03-28 DIAGNOSIS — Z6832 Body mass index (BMI) 32.0-32.9, adult: Secondary | ICD-10-CM | POA: Diagnosis not present

## 2019-03-28 DIAGNOSIS — F330 Major depressive disorder, recurrent, mild: Secondary | ICD-10-CM | POA: Diagnosis not present

## 2019-04-04 DIAGNOSIS — Z8673 Personal history of transient ischemic attack (TIA), and cerebral infarction without residual deficits: Secondary | ICD-10-CM | POA: Diagnosis not present

## 2019-04-04 DIAGNOSIS — Z713 Dietary counseling and surveillance: Secondary | ICD-10-CM | POA: Diagnosis not present

## 2019-04-04 DIAGNOSIS — Z6832 Body mass index (BMI) 32.0-32.9, adult: Secondary | ICD-10-CM | POA: Diagnosis not present

## 2019-04-04 DIAGNOSIS — E6609 Other obesity due to excess calories: Secondary | ICD-10-CM | POA: Diagnosis not present

## 2019-04-11 DIAGNOSIS — Z713 Dietary counseling and surveillance: Secondary | ICD-10-CM | POA: Diagnosis not present

## 2019-04-11 DIAGNOSIS — Z833 Family history of diabetes mellitus: Secondary | ICD-10-CM | POA: Diagnosis not present

## 2019-04-11 DIAGNOSIS — Z6831 Body mass index (BMI) 31.0-31.9, adult: Secondary | ICD-10-CM | POA: Diagnosis not present

## 2019-04-11 DIAGNOSIS — E6609 Other obesity due to excess calories: Secondary | ICD-10-CM | POA: Diagnosis not present

## 2019-04-16 DIAGNOSIS — Q2112 Patent foramen ovale: Secondary | ICD-10-CM | POA: Insufficient documentation

## 2019-04-16 DIAGNOSIS — Q211 Atrial septal defect: Secondary | ICD-10-CM | POA: Insufficient documentation

## 2019-04-16 NOTE — Progress Notes (Signed)
Virtual Visit via Video Note   This visit type was conducted due to national recommendations for restrictions regarding the COVID-19 Pandemic (e.g. social distancing) in an effort to limit this patient's exposure and mitigate transmission in our community.  Due to her co-morbid illnesses, this patient is at least at moderate risk for complications without adequate follow up.  This format is felt to be most appropriate for this patient at this time.  All issues noted in this document were discussed and addressed.  A limited physical exam was performed with this format.  Please refer to the patient's chart for her consent to telehealth for [facility].   I connected with  Ashira R Strada on 04/17/19 by a video enabled telemedicine application and verified that I am speaking with the correct person using two identifiers. I discussed the limitations of evaluation and management by telemedicine. The patient expressed understanding and agreed to proceed.   Evaluation Performed:  Follow-up visit  Date:  04/17/2019   ID:  Ericka Pontiffngelic R Quincy, DOB 08/21/1970, MRN [id number]  Patient Location[place]  [address] [place] [address]   Provider location[facility]   [facility], [place] office  PCP:  Trey SailorsPollak, Adriana M, PA-C  Cardiologist:  Fonnie MuGollan, [facility]   Chief Complaint:  Chest pain    History of Present Illness:    Jaimee Seth BakeR Old is a 48 y.o. female who presents via audio/video conferencing for a telehealth visit today.   The patient does not symptoms concerning for COVID-19 infection (fever, chills, cough, or new SHORTNESS OF BREATH).   Patient has a past medical history of history of stroke 2017  PFO Started on asa, statin 2017 Presents by referral from Cherly AndersonAdriana Polak for chest pain, PFO  emergency departmen 01/25/19 [facility]  left-sided chest pain approximately 15 minutes prior to coming to the emergency department.   pain was radiating down the left arm.  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 Abnormal Papanicolaou smear of cervix with positive human papilloma virus (HPV) test 12/11/2014   LSIL/Mild dysplasia  . Arthritis    hips  . Asthma    as young adult  . Atypical squamous cells of undetermined significance (ASCUS) on Papanicolaou smear of cervix 01/30/2013  . Complication of anesthesia    BP drops  . Family history of breast cancer in female   . Family history of ovarian cancer   . H/O arthroscopy of knee   . Herniated cervical disc   . History of conization of cervix   . History of gestational diabetes   . 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 Migraine headache    none in over 1 yr  . Motion sickness    passenger in car  . PFO (patent foramen ovale)   . Plantar fasciitis   .  Stroke (HCC) 2017  . Wears contact lenses    Past Surgical History:  Procedure Laterality Date  . CERVICAL CONIZATION W/BX    . COLONOSCOPY WITH PROPOFOL N/A 06/20/2018   Procedure: COLONOSCOPY WITH PROPOFOL;  Surgeon: Toney Reil, MD;  [facility];  Service: Endoscopy;  Laterality: N/A;  . ESOPHAGOGASTRODUODENOSCOPY (EGD) WITH PROPOFOL N/A 06/20/2018   Procedure: ESOPHAGOGASTRODUODENOSCOPY (EGD) WITH PROPOFOL;  Surgeon: Toney Reil, MD;  [facility];  Service: Endoscopy;  Laterality: N/A;  . KNEE  SURGERY Right       Allergies:   Patient has no known allergies.   Social History   Tobacco Use  . Smoking status: Never Smoker  . Smokeless tobacco: Never Used  Substance Use Topics  . Alcohol use: Yes    Alcohol/week: 4.0 standard drinks    Types: 1 Glasses of wine, 1 Cans of beer, 2 Shots of liquor per week  . Drug use: No     Current Outpatient Medications on File Prior to Visit  Medication Sig Dispense Refill  . aspirin EC 81 MG tablet Take 81 mg by mouth.    . levonorgestrel (MIRENA) 20 MCG/24HR IUD 1 each by Intrauterine route once.    . sertraline (ZOLOFT) 100 MG tablet Take 100 mg by mouth daily.     No current facility-administered medications on file prior to visit.      Family Hx: The patient's family history includes Breast cancer (age of onset: 29) in her mother; COPD in her mother; Diabetes in her mother and paternal grandmother; Hypertension in her mother; Ovarian cancer (age of onset: 24) in her mother; Skin cancer in her father; Stomach cancer in her maternal grandfather and paternal uncle.  ROS:   Please see the history of present illness.    Review of Systems  Constitutional: Negative.   HENT: Negative.   Respiratory: Negative.   Cardiovascular: Positive for chest pain and leg swelling.  Gastrointestinal: Negative.   Musculoskeletal: Negative.   Neurological: Negative.   Psychiatric/Behavioral: Negative.   All other systems reviewed and are negative.    Labs/Other Tests and Data Reviewed:    Recent Labs: 05/02/2018: ALT 20; BUN 13; Creatinine, Ser 0.68; Hemoglobin 12.3; Platelets 288; Potassium 4.3; Sodium 139   Recent Lipid Panel Lab Results  Component Value Date/Time   CHOL 190 05/02/2018 10:15 AM   TRIG 126 05/02/2018 10:15 AM   HDL 44 05/02/2018 10:15 AM   CHOLHDL 4.3 05/02/2018 10:15 AM   CHOLHDL 4.0 08/22/2015 06:48 PM   LDLCALC 121 (H) 05/02/2018 10:15 AM    Wt Readings from Last 3 Encounters:  04/17/19 185 lb (83.9 kg)  07/10/18  186 lb 12.8 oz (84.7 kg)  06/20/18 187 lb (84.8 kg)     Exam:    Vital Signs: Vital signs may also be detailed in the HPI BP 119/78   Pulse 78   Ht 5\' 4"  (1.626 m)   Wt 185 lb (83.9 kg)   BMI 31.76 kg/m   Wt Readings from Last 3 Encounters:  04/17/19 185 lb (83.9 kg)  07/10/18 186 lb 12.8 oz (84.7 kg)  06/20/18 187 lb (84.8 kg)   Temp Readings from Last 3 Encounters:  06/20/18 97.7 F (36.5 C)  05/02/18 97.8 F (36.6 C) (Oral)  10/26/17 98.6 F (37 C) (Oral)   BP Readings from Last 3 Encounters:  04/17/19 119/78  07/10/18 106/73  06/20/18 96/60   Pulse Readings from Last 3 Encounters:  04/17/19 78  07/10/18 64  06/20/18 (!) 74     Well nourished, well developed female in no acute distress. Constitutional:  oriented to person, place, and time. No distress.  Head: Normocephalic and atraumatic.  Eyes:  no discharge. No scleral icterus.  Neck: Normal range of motion. Neck supple.  Pulmonary/Chest: No audible wheezing, no distress, appears comfortable Musculoskeletal: Normal range of motion.  no  tenderness or deformity.  Neurological:   Coordination normal. Full exam not performed Skin:  No rash Psychiatric:  normal mood and affect. behavior is normal. Thought content normal.    ASSESSMENT & PLAN:    Problem List Items Addressed This Visit      Cardiology Problems   PFO (patent foramen ovale)     Other   Cerebral infarction [facility]) - Primary     Atypical chest pain Better with NSAIDS Hurt putting a cork in a bottle down her arm Has resolved since then, she is exercising at the gym without symptoms  CVA  Remote secondary to PFO in  2017 Stable past 3 years on aspirin 81 mg daily  PFO Seen on TEE in 2017 after CVA Appears to be relatively large with significant contrast shunt right to left Been on asa since stroke 2017, no further sx,  no  TIA or stroke symptoms Previous TEE reviewed, unable to exclude dilation of right atrium right ventricle, unable  to assess right heart pressures -Exercising without shortness of breath or other symptoms Interestingly she does report having some leg edema, unable to exclude dependent edema We have ordered repeat echocardiogram to evaluate shunt, right heart pressures, RV dilatation   COVID-19 Education: The signs and symptoms of COVID-19 were discussed with the patient and how to seek care for testing (follow up with PCP or arrange E-visit).  The importance of social distancing was discussed today.  Patient Risk:   After full review of this patients clinical status, I feel that they are at least moderate risk at this time.  Time:   Today, I have spent 60 minutes with the patient with telehealth technology discussing the cardiac and medical problems/diagnoses detailed above   Additional 10 min spent reviewing the chart prior to patient visit today   Medication Adjustments/Labs and Tests Ordered: Current medicines are reviewed at length with the patient today.  Concerns regarding medicines are outlined above.   Tests Ordered: No tests ordered   Medication Changes: No changes made   Disposition: Follow-up as needed  Patient was seen in consultation for Carles Collet and will be referred back to her office for ongoing care of the issues detailed above   Signed, Ida Rogue, [place] [facility] [address], [place], [place] [address]

## 2019-04-17 ENCOUNTER — Other Ambulatory Visit: Payer: Self-pay

## 2019-04-17 ENCOUNTER — Telehealth (INDEPENDENT_AMBULATORY_CARE_PROVIDER_SITE_OTHER): Payer: BC Managed Care – PPO | Admitting: Cardiovascular Disease

## 2019-04-17 VITALS — BP 119/78 | HR 78 | Ht 64.0 in | Wt 185.0 lb

## 2019-04-17 DIAGNOSIS — Q211 Atrial septal defect: Secondary | ICD-10-CM | POA: Diagnosis not present

## 2019-04-17 DIAGNOSIS — R079 Chest pain, unspecified: Secondary | ICD-10-CM

## 2019-04-17 DIAGNOSIS — I6349 Cerebral infarction due to embolism of other cerebral artery: Secondary | ICD-10-CM | POA: Diagnosis not present

## 2019-04-17 DIAGNOSIS — Q2112 Patent foramen ovale: Secondary | ICD-10-CM

## 2019-04-17 DIAGNOSIS — R0789 Other chest pain: Secondary | ICD-10-CM

## 2019-04-17 NOTE — Patient Instructions (Addendum)
Echocardiogram with bubble study for PFO, chest pain  Medication Instructions:  No changes  If you need a refill on your cardiac medications before your next appointment, please call your pharmacy.    Lab work: No new labs needed   If you have labs (blood work) drawn today and your tests are completely normal, you will receive your results only by: Marland Kitchen. MyChart Message (if you have MyChart) OR . A paper copy in the mail If you have any lab test that is abnormal or we need to change your treatment, we will call you to review the results.   Testing/Procedures: Your physician has requested that you have an echocardiogram with bubble study. Echocardiography is a painless test that uses sound waves to create images of your heart. It provides your doctor with information about the size and shape of your heart and how well your heart's chambers and valves are working. This procedure takes approximately one hour. There are no restrictions for this procedure.   Follow-Up: At [facility], you and your health needs are our priority.  As part of our continuing mission to provide you with exceptional heart care, we have created designated Provider Care Teams.  These Care Teams include your primary Cardiologist (physician) and Advanced Practice Providers (APPs -  Physician Assistants and Nurse Practitioners) who all work together to provide you with the care you need, when you need it.  . You will need a follow up appointment in 12 months (September 2021) .   Please call our office 2 months in advance to schedule this appointment. (Call in early July 2021)    . Providers on your designated Care Team:   . Nicolasa Duckinghristopher Berge, NP . Eula Listenyan Dunn, PA-C . Marisue IvanJacquelyn Visser, PA-C  Any Other Special Instructions Will Be Listed Below (If Applicable).  For educational health videos Log in to : [email or web address].com Or : [email or web address], password : [email or web address]    Echocardiogram An echocardiogram is a procedure that  uses painless sound waves (ultrasound) to produce an image of the heart. Images from an echocardiogram can provide important information about:  Signs of coronary artery disease (CAD).  Aneurysm detection. An aneurysm is a weak or damaged part of an artery wall that bulges out from the normal force of blood pumping through the body.  Heart size and shape. Changes in the size or shape of the heart can be associated with certain conditions, including heart failure, aneurysm, and CAD.  Heart muscle function.  Heart valve function.  Signs of a past heart attack.  Fluid buildup around the heart.  Thickening of the heart muscle.  A tumor or infectious growth around the heart valves. Tell a health care provider about:  Any allergies you have.  All medicines you are taking, including vitamins, herbs, eye drops, creams, and over-the-counter medicines.  Any blood disorders you have.  Any surgeries you have had.  Any medical conditions you have.  Whether you are pregnant or may be pregnant. What are the risks? Generally, this is a safe procedure. However, problems may occur, including:  Allergic reaction to dye (contrast) that may be used during the procedure. What happens before the procedure? No specific preparation is needed. You may eat and drink normally. What happens during the procedure?   An IV tube may be inserted into one of your veins.  You may receive contrast through this tube. A contrast is an injection that improves the quality of the pictures from your heart.  A gel will be  applied to your chest.  A wand-like tool (transducer) will be moved over your chest. The gel will help to transmit the sound waves from the transducer.  The sound waves will harmlessly bounce off of your heart to allow the heart images to be captured in real-time motion. The images will be recorded on a computer. The procedure may vary among health care providers and hospitals. What happens  after the procedure?  You may return to your normal, everyday life, including diet, activities, and medicines, unless your health care provider tells you not to do that. Summary  An echocardiogram is a procedure that uses painless sound waves (ultrasound) to produce an image of the heart.  Images from an echocardiogram can provide important information about the size and shape of your heart, heart muscle function, heart valve function, and fluid buildup around your heart.  You do not need to do anything to prepare before this procedure. You may eat and drink normally.  After the echocardiogram is completed, you may return to your normal, everyday life, unless your health care provider tells you not to do that. This information is not intended to replace advice given to you by your health care provider. Make sure you discuss any questions you have with your health care provider. Document Released: 07/15/2000 Document Revised: 11/08/2018 Document Reviewed: 08/20/2016 Elsevier Patient Education  2020 [employer].

## 2019-05-10 ENCOUNTER — Other Ambulatory Visit: Payer: Self-pay

## 2019-05-10 ENCOUNTER — Ambulatory Visit (INDEPENDENT_AMBULATORY_CARE_PROVIDER_SITE_OTHER): Payer: BC Managed Care – PPO

## 2019-05-10 DIAGNOSIS — R079 Chest pain, unspecified: Secondary | ICD-10-CM

## 2019-05-10 DIAGNOSIS — Q211 Atrial septal defect: Secondary | ICD-10-CM | POA: Diagnosis not present

## 2019-05-10 DIAGNOSIS — Q2112 Patent foramen ovale: Secondary | ICD-10-CM

## 2019-05-22 ENCOUNTER — Telehealth: Payer: Self-pay | Admitting: Cardiovascular Disease

## 2019-05-22 ENCOUNTER — Other Ambulatory Visit: Payer: Self-pay

## 2019-05-22 NOTE — Telephone Encounter (Signed)
Patient needs a letter sent to her Bariatric doctor, [facility] [place], stating she is ok to take a appetite suppressant. Please call to discuss.

## 2019-05-23 DIAGNOSIS — Z713 Dietary counseling and surveillance: Secondary | ICD-10-CM | POA: Diagnosis not present

## 2019-05-23 DIAGNOSIS — Z6831 Body mass index (BMI) 31.0-31.9, adult: Secondary | ICD-10-CM | POA: Diagnosis not present

## 2019-05-23 DIAGNOSIS — E6609 Other obesity due to excess calories: Secondary | ICD-10-CM | POA: Diagnosis not present

## 2019-05-23 DIAGNOSIS — E7841 Elevated Lipoprotein(a): Secondary | ICD-10-CM | POA: Diagnosis not present

## 2019-05-23 NOTE — Telephone Encounter (Signed)
Patient calling to relay fax for [facility] .  Please send letter.   [phone] fax

## 2019-05-24 ENCOUNTER — Other Ambulatory Visit: Payer: Self-pay

## 2019-05-24 ENCOUNTER — Ambulatory Visit (INDEPENDENT_AMBULATORY_CARE_PROVIDER_SITE_OTHER): Payer: BC Managed Care – PPO | Admitting: Physician Assistant

## 2019-05-24 ENCOUNTER — Encounter: Payer: Self-pay | Admitting: Physician Assistant

## 2019-05-24 VITALS — BP 104/72 | HR 69 | Temp 97.1°F | Resp 16 | Ht 62.0 in | Wt 190.0 lb

## 2019-05-24 DIAGNOSIS — E785 Hyperlipidemia, unspecified: Secondary | ICD-10-CM

## 2019-05-24 DIAGNOSIS — Z1231 Encounter for screening mammogram for malignant neoplasm of breast: Secondary | ICD-10-CM | POA: Diagnosis not present

## 2019-05-24 DIAGNOSIS — Z0000 Encounter for general adult medical examination without abnormal findings: Secondary | ICD-10-CM

## 2019-05-24 DIAGNOSIS — F32A Depression, unspecified: Secondary | ICD-10-CM

## 2019-05-24 DIAGNOSIS — F329 Major depressive disorder, single episode, unspecified: Secondary | ICD-10-CM

## 2019-05-24 DIAGNOSIS — E669 Obesity, unspecified: Secondary | ICD-10-CM

## 2019-05-24 DIAGNOSIS — I6349 Cerebral infarction due to embolism of other cerebral artery: Secondary | ICD-10-CM

## 2019-05-24 DIAGNOSIS — F419 Anxiety disorder, unspecified: Secondary | ICD-10-CM | POA: Diagnosis not present

## 2019-05-24 DIAGNOSIS — Z8619 Personal history of other infectious and parasitic diseases: Secondary | ICD-10-CM

## 2019-05-24 MED ORDER — ATORVASTATIN CALCIUM 10 MG PO TABS: 10.0000 mg | ORAL_TABLET | Freq: Every day | ORAL | 3 refills | Status: DC

## 2019-05-24 MED ORDER — SERTRALINE HCL 100 MG PO TABS: 100.0000 mg | ORAL_TABLET | Freq: Every day | ORAL | 3 refills | Status: DC

## 2019-05-24 NOTE — Telephone Encounter (Signed)
Okay to write letter giving permission to try appetite suppressant  I did review prior TEE and recent transthoracic echo She did have documented PFO on prior TEE, bubbles crossing from right to left On our transthoracic echo bubble study was not grossly positive This does not mean her PFO has gone away.  likely still has the PFO we were just not able to get bubbles to cross from the right side of the heart to the left with her maneuvers during the study -Would stay on the aspirin

## 2019-05-24 NOTE — Patient Instructions (Addendum)
Health Maintenance, Female Adopting a healthy lifestyle and getting preventive care are important in promoting health and wellness. Ask your health care provider about:  The right schedule for you to have regular tests and exams.  Things you can do on your own to prevent diseases and keep yourself healthy. What should I know about diet, weight, and exercise? Eat a healthy diet   Eat a diet that includes plenty of vegetables, fruits, low-fat dairy products, and lean protein.  Do not eat a lot of foods that are high in solid fats, added sugars, or sodium. Maintain a healthy weight Body mass index (BMI) is used to identify weight problems. It estimates body fat based on height and weight. Your health care provider can help determine your BMI and help you achieve or maintain a healthy weight. Get regular exercise Get regular exercise. This is one of the most important things you can do for your health. Most adults should:  Exercise for at least 150 minutes each week. The exercise should increase your heart rate and make you sweat (moderate-intensity exercise).  Do strengthening exercises at least twice a week. This is in addition to the moderate-intensity exercise.  Spend less time sitting. Even light physical activity can be beneficial. Watch cholesterol and blood lipids Have your blood tested for lipids and cholesterol at 46 years of age, then have this test every 5 years. Have your cholesterol levels checked more often if:  Your lipid or cholesterol levels are high.  You are older than 46 years of age.  You are at high risk for heart disease. What should I know about cancer screening? Depending on your health history and family history, you may need to have cancer screening at various ages. This may include screening for:  Breast cancer.  Cervical cancer.  Colorectal cancer.  Skin cancer.  Lung cancer. What should I know about heart disease, diabetes, and high blood  pressure? Blood pressure and heart disease  High blood pressure causes heart disease and increases the risk of stroke. This is more likely to develop in people who have high blood pressure readings, are of African descent, or are overweight.  Have your blood pressure checked: ? Every 3-5 years if you are 18-39 years of age. ? Every year if you are 40 years old or older. Diabetes Have regular diabetes screenings. This checks your fasting blood sugar level. Have the screening done:  Once every three years after age 40 if you are at a normal weight and have a low risk for diabetes.  More often and at a younger age if you are overweight or have a high risk for diabetes. What should I know about preventing infection? Hepatitis B If you have a higher risk for hepatitis B, you should be screened for this virus. Talk with your health care provider to find out if you are at risk for hepatitis B infection. Hepatitis C Testing is recommended for:  Everyone born from 1945 through 1965.  Anyone with known risk factors for hepatitis C. Sexually transmitted infections (STIs)  Get screened for STIs, including gonorrhea and chlamydia, if: ? You are sexually active and are younger than 46 years of age. ? You are older than 46 years of age and your health care provider tells you that you are at risk for this type of infection. ? Your sexual activity has changed since you were last screened, and you are at increased risk for chlamydia or gonorrhea. Ask your health care provider if   you are at risk.  Ask your health care provider about whether you are at high risk for HIV. Your health care provider may recommend a prescription medicine to help prevent HIV infection. If you choose to take medicine to prevent HIV, you should first get tested for HIV. You should then be tested every 3 months for as long as you are taking the medicine. Pregnancy  If you are about to stop having your period (premenopausal) and  you may become pregnant, seek counseling before you get pregnant.  Take 400 to 800 micrograms (mcg) of folic acid every day if you become pregnant.  Ask for birth control (contraception) if you want to prevent pregnancy. Osteoporosis and menopause Osteoporosis is a disease in which the bones lose minerals and strength with aging. This can result in bone fractures. If you are 68 years old or older, or if you are at risk for osteoporosis and fractures, ask your health care provider if you should:  Be screened for bone loss.  Take a calcium or vitamin D supplement to lower your risk of fractures.  Be given hormone replacement therapy (HRT) to treat symptoms of menopause. Follow these instructions at home: Lifestyle  Do not use any products that contain nicotine or tobacco, such as cigarettes, e-cigarettes, and chewing tobacco. If you need help quitting, ask your health care provider.  Do not use street drugs.  Do not share needles.  Ask your health care provider for help if you need support or information about quitting drugs. Alcohol use  Do not drink alcohol if: ? Your health care provider tells you not to drink. ? You are pregnant, may be pregnant, or are planning to become pregnant.  If you drink alcohol: ? Limit how much you use to 0-1 drink a day. ? Limit intake if you are breastfeeding.  Be aware of how much alcohol is in your drink. In the [place] one drink equals one 12 oz bottle of beer (355 mL), one 5 oz glass of wine (148 mL), or one 1 oz glass of hard liquor (44 mL). General instructions  Schedule regular health, dental, and eye exams.  Stay current with your vaccines.  Tell your health care provider if: ? You often feel depressed. ? You have ever been abused or do not feel safe at home. Summary  Adopting a healthy lifestyle and getting preventive care are important in promoting health and wellness.  Follow your health care provider's instructions about healthy  diet, exercising, and getting tested or screened for diseases.  Follow your health care provider's instructions on monitoring your cholesterol and blood pressure. This information is not intended to replace advice given to you by your health care provider. Make sure you discuss any questions you have with your health care provider. Document Released: 01/31/2011 Document Revised: 07/11/2018 Document Reviewed: 07/11/2018 Elsevier Patient Education  2020 [place] in: [facility] Address: [address], [place], [place] [address] Hours:  Open ? Closes 4:30PM Phone: [phone]

## 2019-05-24 NOTE — Progress Notes (Signed)
hh n                                                                                                                                                                                                                                                                                                                               Patient: Tammy Sosa, Female    DOB: Mar 02, 1971, 48 y.o.   MRN: [id number] Visit Date: 05/24/2019  Today's Provider: Trey Sailors, PA-C   Chief Complaint  Patient presents with  . Annual Exam   Subjective:     Annual physical exam Tammy Sosa is a 48 y.o. female who presents today for health maintenance and complete physical. She feels well. She reports exercising none. She reports she is sleeping well. 05/02/2018 CPE 01/26/2018 Pap/HPV-negative 08/16/2016 Mammogram-BI-RADS 1  Depression and anxiety: Stopped zoloft 100 mg QD but then her family was commenting she is in a bad mood and so she is going back on it.   Sleep Study: says she got this with Dr. Welton Flakes cardiology one year ago and it was negative.  Sees [facility]: going to start appetite suppressant. She cannot remember which one. She says it is tired up in insurance. Reports she got as high as 210lbs.   Wt Readings from Last 3 Encounters:  05/24/19 190 lb (86.2 kg)  04/17/19 185 lb (83.9 kg)  07/10/18 186 lb 12.8 oz (84.7 kg)   CVA: In the interim she has followed up with cardiology and got an echo on 05/10/2019 which did not show a PFO. She is not taking her lipitor 10 mg because she said she had another dose and was confused which dose to take. Then she reports she does not take it because she gets home at midnight and she thought she was supposed to take it at [facility]. She did not call the clinic.  H. Pyloria: She was treated with triple therapy in 07/2018 for  H. Pylori. Today she reports abdominal pain and bloating that continues. She says nobody ever got back to her from the GI clinic. Last  visit 07/20/2018 with Gi she had completed therapy and was instructed to follow up in 4 weeks for repeat testing. She did not follow up as instructed. She did not call the clinic because she says sh cannot remember the name of the clinic who did this testing.   -----------------------------------------------------------------   Review of Systems  Constitutional: Negative.   HENT: Positive for rhinorrhea.   Eyes: Negative.   Respiratory: Negative.   Cardiovascular: Positive for leg swelling.  Gastrointestinal: Positive for abdominal distention and abdominal pain.  Endocrine: Negative.   Genitourinary: Negative.   Musculoskeletal: Positive for back pain and neck pain.  Skin: Negative.   Allergic/Immunologic: Negative.   Neurological: Positive for tremors.  Hematological: Negative.   Psychiatric/Behavioral: The patient is nervous/anxious.     Social History      She  reports that she has never smoked. She has never used smokeless tobacco. She reports current alcohol use of about 4.0 standard drinks of alcohol per week. She reports that she does not use drugs.       Social History   Socioeconomic History  . Marital status: Married    Spouse name: Not on file  . Number of children: Not on file  . Years of education: Not on file  . Highest education level: Not on file  Occupational History  . Not on file  Social Needs  . Financial resource strain: Not on file  . Food insecurity    Worry: Not on file    Inability: Not on file  . Transportation needs    Medical: Not on file    Non-medical: Not on file  Tobacco Use  . Smoking status: Never Smoker  . Smokeless tobacco: Never Used  Substance and Sexual Activity  . Alcohol use: Yes    Alcohol/week: 4.0 standard drinks    Types: 1 Glasses of wine, 1 Cans of beer, 2 Shots of liquor per week  . Drug use: No  . Sexual activity: Yes    Birth control/protection: I.U.D.  Lifestyle  . Physical activity    Days per week: Not on  file    Minutes per session: Not on file  . Stress: Not on file  Relationships  . Social Musicianconnections    Talks on phone: Not on file    Gets together: Not on file    Attends religious service: Not on file    Active member of club or organization: Not on file    Attends meetings of clubs or organizations: Not on file    Relationship status: Not on file  Other Topics Concern  . Not on file  Social History Narrative  . Not on file    Past Medical History:  Diagnosis Date  . Abnormal Papanicolaou smear of cervix with positive human papilloma virus (HPV) test 12/11/2014   LSIL/Mild dysplasia  . Arthritis    hips  . Asthma    as young adult  . Atypical squamous cells of undetermined significance (ASCUS) on Papanicolaou smear of cervix 01/30/2013  . Complication of anesthesia    BP drops  . Family history of breast cancer in female   . Family history of ovarian cancer   . H/O arthroscopy of knee   . Herniated cervical disc   . History of conization of cervix   . History of gestational diabetes   .  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 Migraine headache    none in over 1 yr  . Motion sickness    passenger in car  . PFO (patent foramen ovale)   . Plantar fasciitis   . Stroke ([place]) 2017  . Wears contact lenses      Patient Active Problem List   Diagnosis Date Noted  . PFO (patent foramen ovale) 04/16/2019  . H. pylori infection 07/10/2018  . Obesity (BMI 30.0-34.9) 11/29/2017  . Anxiety and depression 09/28/2017  . Cerebral infarction ([place]) 08/23/2015  . Right sided weakness 08/22/2015  . Headache 08/22/2015  . Other specified disorders of muscle 08/22/2015    Past Surgical History:  Procedure Laterality Date  . CERVICAL CONIZATION W/BX    . COLONOSCOPY WITH PROPOFOL N/A 06/20/2018   Procedure: COLONOSCOPY WITH PROPOFOL;  Surgeon: Lin Landsman, MD;  Location: [facility];  Service: Endoscopy;  Laterality: N/A;  . ESOPHAGOGASTRODUODENOSCOPY (EGD)  WITH PROPOFOL N/A 06/20/2018   Procedure: ESOPHAGOGASTRODUODENOSCOPY (EGD) WITH PROPOFOL;  Surgeon: Lin Landsman, MD;  Location: [facility];  Service: Endoscopy;  Laterality: N/A;  . KNEE SURGERY Right     Family History        Family Status  Relation Name Status  . Mother  Alive  . Father  Alive  . Annamarie Major  (Not Specified)  . MGF  (Not Specified)  . PGM  Deceased        Her family history includes Breast cancer (age of onset: 12) in her mother; COPD in her mother; Diabetes in her mother and paternal grandmother; Hypertension in her mother; Ovarian cancer (age of onset: 26) in her mother; Skin cancer in her father; Stomach cancer in her maternal grandfather and paternal uncle.      No Known Allergies   Current Outpatient Medications:  .  aspirin EC 81 MG tablet, Take 81 mg by mouth., Disp: , Rfl:  .  levonorgestrel (MIRENA) 20 MCG/24HR IUD, 1 each by Intrauterine route once., Disp: , Rfl:  .  sertraline (ZOLOFT) 100 MG tablet, Take 1 tablet (100 mg total) by mouth daily., Disp: 90 tablet, Rfl: 3 .  atorvastatin (LIPITOR) 10 MG tablet, Take 1 tablet (10 mg total) by mouth daily., Disp: 90 tablet, Rfl: 3   Patient Care Team: Paulene Floor as PCP - General (Physician Assistant)    Objective:    Vitals: BP 104/72 (BP Location: Left Arm, Patient Position: Sitting, Cuff Size: Normal)   Pulse 69   Temp (!) 97.1 F (36.2 C) (Temporal)   Resp 16   Ht 5\' 2"  (1.575 m)   Wt 190 lb (86.2 kg)   BMI 34.75 kg/m    Vitals:   05/24/19 0905  BP: 104/72  Pulse: 69  Resp: 16  Temp: (!) 97.1 F (36.2 C)  TempSrc: Temporal  Weight: 190 lb (86.2 kg)  Height: 5\' 2"  (1.575 m)     Physical Exam Constitutional:      Appearance: Normal appearance. She is obese.  Cardiovascular:     Rate and Rhythm: Normal rate and regular rhythm.     Heart sounds: Normal heart sounds.  Pulmonary:     Effort: Pulmonary effort is normal.     Breath sounds: Normal breath  sounds.  Chest:     Breasts:        Right: Normal.        Left: Normal.  Abdominal:     General: Bowel sounds are  normal.     Palpations: Abdomen is soft.  Skin:    General: Skin is warm and dry.  Neurological:     Mental Status: She is alert and oriented to person, place, and time. Mental status is at baseline.  Psychiatric:        Mood and Affect: Mood normal.        Behavior: Behavior normal.      Depression Screen PHQ 2/9 Scores 05/24/2019 05/02/2018 09/28/2017 08/30/2017  PHQ - 2 Score 3 1 0 6  PHQ- 9 Score Assessment & Plan:     Routine Health Maintenance and Physical Exam  Exercise Activities and Dietary recommendations Goals   None      There is no immunization history on file for this patient.  Health Maintenance  Topic Date Due  . TETANUS/TDAP  06/09/1992  . MAMMOGRAM  08/16/2017  . INFLUENZA VACCINE  10/30/2019 (Originally 03/02/2019)  . PAP SMEAR-Modifier  01/26/2022  . COLONOSCOPY  06/21/2023  . HIV Screening  Completed     Discussed health benefits of physical activity, and encouraged her to engage in regular exercise appropriate for her age and condition.    1. Annual physical exam  Mammogram ordered and [place] contact card given.   - CBC with Differential/Platelet - Comprehensive metabolic panel - Lipid Panel With LDL/HDL Ratio - TSH  2. Hyperlipidemia, unspecified hyperlipidemia type  Counseled she needs to take her statin. Counseled it may not work optimally but it will work regardless of when she takes it and she should take it daily.   - Comprehensive metabolic panel - Lipid Panel With LDL/HDL Ratio  3. Obesity (BMI 30.0-34.9)  Continues. [facility]  4. Anxiety and depression  Restart sertraline.  - TSH - sertraline (ZOLOFT) 100 MG tablet; Take 1 tablet (100 mg total) by mouth daily.  Dispense: 90 tablet; Refill: 3  5. Encounter for screening mammogram for malignant neoplasm  of breast  - MM 3D SCREEN BREAST BILATERAL; Future  6. 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  - atorvastatin (LIPITOR) 10 MG tablet; Take 1 tablet (10 mg total) by mouth daily.  Dispense: 90 tablet; Refill: 3  7. History of Helicobacter pylori infection  Never got repeat testing. Gave [facility] gi number. Counseled that she needs to ask for business card when she has her appointments and she needs to refer to her AVS which provides the clinic number for all of her visits.  The entirety of the information documented in the History of Present Illness, Review of Systems and Physical Exam were personally obtained by me. Portions of this information were initially documented by Rondel Baton, CMA and reviewed by me for thoroughness and accuracy.    --------------------------------------------------------------------    Trey Sailors, PA-C  [facility]

## 2019-05-25 LAB — TSH: TSH: 2.18 u[IU]/mL (ref 0.450–4.500)

## 2019-05-25 LAB — CBC WITH DIFFERENTIAL/PLATELET
Basophils Absolute: 0.1 10*3/uL (ref 0.0–0.2)
Basos: 1 %
EOS (ABSOLUTE): 0.1 10*3/uL (ref 0.0–0.4)
Eos: 2 %
Hematocrit: 39.3 % (ref 34.0–46.6)
Hemoglobin: 13 g/dL (ref 11.1–15.9)
Immature Grans (Abs): 0 10*3/uL (ref 0.0–0.1)
Immature Granulocytes: 0 %
Lymphocytes Absolute: 2.7 10*3/uL (ref 0.7–3.1)
Lymphs: 33 %
MCH: 29.3 pg (ref 26.6–33.0)
MCHC: 33.1 g/dL (ref 31.5–35.7)
MCV: 89 fL (ref 79–97)
Monocytes Absolute: 0.5 10*3/uL (ref 0.1–0.9)
Monocytes: 6 %
Neutrophils Absolute: 4.8 10*3/uL (ref 1.4–7.0)
Neutrophils: 58 %
Platelets: 282 10*3/uL (ref 150–450)
RBC: 4.44 x10E6/uL (ref 3.77–5.28)
RDW: 13.3 % (ref 11.7–15.4)
WBC: 8.1 10*3/uL (ref 3.4–10.8)

## 2019-05-25 LAB — COMPREHENSIVE METABOLIC PANEL
ALT: 17 IU/L (ref 0–32)
AST: 18 IU/L (ref 0–40)
Albumin/Globulin Ratio: 1.8 (ref 1.2–2.2)
Albumin: 4.2 g/dL (ref 3.8–4.8)
Alkaline Phosphatase: 78 IU/L (ref 39–117)
BUN/Creatinine Ratio: 12 (ref 9–23)
BUN: 10 mg/dL (ref 6–24)
Bilirubin Total: <0.2 mg/dL (ref 0.0–1.2)
CO2: 22 mmol/L (ref 20–29)
Calcium: 9.2 mg/dL (ref 8.7–10.2)
Chloride: 104 mmol/L (ref 96–106)
Creatinine, Ser: 0.81 mg/dL (ref 0.57–1.00)
GFR calc Af Amer: 101 mL/min/{1.73_m2} (ref >59)
GFR calc non Af Amer: 88 mL/min/{1.73_m2} (ref >59)
Globulin, Total: 2.4 g/dL (ref 1.5–4.5)
Glucose: 93 mg/dL (ref 65–99)
Potassium: 4.2 mmol/L (ref 3.5–5.2)
Sodium: 139 mmol/L (ref 134–144)
Total Protein: 6.6 g/dL (ref 6.0–8.5)

## 2019-05-25 LAB — LIPID PANEL WITH LDL/HDL RATIO
Cholesterol, Total: 185 mg/dL (ref 100–199)
HDL: 47 mg/dL (ref >39)
LDL Chol Calc (NIH): 123 mg/dL — ABNORMAL HIGH (ref 0–99)
LDL/HDL Ratio: 2.6 ratio (ref 0.0–3.2)
Triglycerides: 84 mg/dL (ref 0–149)
VLDL Cholesterol Cal: 15 mg/dL (ref 5–40)

## 2019-05-27 NOTE — Telephone Encounter (Signed)
Telephone note faxed to number provided.   Sent mychart message to make patient aware.

## 2019-05-30 DIAGNOSIS — Z6832 Body mass index (BMI) 32.0-32.9, adult: Secondary | ICD-10-CM | POA: Diagnosis not present

## 2019-05-30 DIAGNOSIS — Z800 Family history of malignant neoplasm of digestive organs: Secondary | ICD-10-CM | POA: Diagnosis not present

## 2019-05-30 DIAGNOSIS — Z713 Dietary counseling and surveillance: Secondary | ICD-10-CM | POA: Diagnosis not present

## 2019-05-30 DIAGNOSIS — E6609 Other obesity due to excess calories: Secondary | ICD-10-CM | POA: Diagnosis not present

## 2019-06-06 DIAGNOSIS — Z6831 Body mass index (BMI) 31.0-31.9, adult: Secondary | ICD-10-CM | POA: Diagnosis not present

## 2019-06-06 DIAGNOSIS — E6609 Other obesity due to excess calories: Secondary | ICD-10-CM | POA: Diagnosis not present

## 2019-06-06 DIAGNOSIS — Z800 Family history of malignant neoplasm of digestive organs: Secondary | ICD-10-CM | POA: Diagnosis not present

## 2019-06-06 DIAGNOSIS — Z713 Dietary counseling and surveillance: Secondary | ICD-10-CM | POA: Diagnosis not present

## 2019-06-16 DIAGNOSIS — S60450A Superficial foreign body of right index finger, initial encounter: Secondary | ICD-10-CM | POA: Diagnosis not present

## 2019-06-20 DIAGNOSIS — E6609 Other obesity due to excess calories: Secondary | ICD-10-CM | POA: Diagnosis not present

## 2019-06-20 DIAGNOSIS — E7841 Elevated Lipoprotein(a): Secondary | ICD-10-CM | POA: Diagnosis not present

## 2019-06-20 DIAGNOSIS — Z713 Dietary counseling and surveillance: Secondary | ICD-10-CM | POA: Diagnosis not present

## 2019-06-20 DIAGNOSIS — Z6831 Body mass index (BMI) 31.0-31.9, adult: Secondary | ICD-10-CM | POA: Diagnosis not present

## 2019-06-25 DIAGNOSIS — L658 Other specified nonscarring hair loss: Secondary | ICD-10-CM | POA: Diagnosis not present

## 2019-06-25 DIAGNOSIS — L02821 Furuncle of head [any part, except face]: Secondary | ICD-10-CM | POA: Diagnosis not present

## 2019-07-04 DIAGNOSIS — E6609 Other obesity due to excess calories: Secondary | ICD-10-CM | POA: Diagnosis not present

## 2019-07-04 DIAGNOSIS — F330 Major depressive disorder, recurrent, mild: Secondary | ICD-10-CM | POA: Diagnosis not present

## 2019-07-04 DIAGNOSIS — Z713 Dietary counseling and surveillance: Secondary | ICD-10-CM | POA: Diagnosis not present

## 2019-07-04 DIAGNOSIS — Z6831 Body mass index (BMI) 31.0-31.9, adult: Secondary | ICD-10-CM | POA: Diagnosis not present

## 2019-07-18 ENCOUNTER — Telehealth: Payer: Self-pay | Admitting: Physician Assistant

## 2019-07-18 DIAGNOSIS — Z833 Family history of diabetes mellitus: Secondary | ICD-10-CM | POA: Diagnosis not present

## 2019-07-18 DIAGNOSIS — E6609 Other obesity due to excess calories: Secondary | ICD-10-CM | POA: Diagnosis not present

## 2019-07-18 DIAGNOSIS — F32A Depression, unspecified: Secondary | ICD-10-CM

## 2019-07-18 DIAGNOSIS — Z6831 Body mass index (BMI) 31.0-31.9, adult: Secondary | ICD-10-CM | POA: Diagnosis not present

## 2019-07-18 DIAGNOSIS — F329 Major depressive disorder, single episode, unspecified: Secondary | ICD-10-CM

## 2019-07-18 DIAGNOSIS — F419 Anxiety disorder, unspecified: Secondary | ICD-10-CM

## 2019-07-18 DIAGNOSIS — Z713 Dietary counseling and surveillance: Secondary | ICD-10-CM | POA: Diagnosis not present

## 2019-07-18 NOTE — Telephone Encounter (Signed)
rx refill sertraline (ZOLOFT) 100 MG tablet  PHARMACY [id number] - [place], [place] - [address] Phone:  [phone]  Fax:  [phone]

## 2019-07-18 NOTE — Telephone Encounter (Signed)
From [facility] 

## 2019-07-22 NOTE — Telephone Encounter (Signed)
Spoke to pharmacy and they states that patient has enough refills on file that request must be a error.

## 2019-07-31 MED ORDER — SERTRALINE HCL 100 MG PO TABS: 100.0000 mg | ORAL_TABLET | Freq: Every day | ORAL | 3 refills | Status: DC

## 2019-07-31 NOTE — Telephone Encounter (Signed)
From [facility] 

## 2019-07-31 NOTE — Addendum Note (Signed)
Addended by: Trinna Post on: 07/31/2019 03:41 PM   Modules accepted: Orders

## 2019-07-31 NOTE — Telephone Encounter (Addendum)
Pt said she only got #60 sertraline instead of #90 in oct 2020. Pt needs a early refill [facility] [address]

## 2019-08-08 DIAGNOSIS — Z8673 Personal history of transient ischemic attack (TIA), and cerebral infarction without residual deficits: Secondary | ICD-10-CM | POA: Diagnosis not present

## 2019-08-08 DIAGNOSIS — E6609 Other obesity due to excess calories: Secondary | ICD-10-CM | POA: Diagnosis not present

## 2019-08-08 DIAGNOSIS — Z713 Dietary counseling and surveillance: Secondary | ICD-10-CM | POA: Diagnosis not present

## 2019-08-08 DIAGNOSIS — Z6831 Body mass index (BMI) 31.0-31.9, adult: Secondary | ICD-10-CM | POA: Diagnosis not present

## 2019-08-15 DIAGNOSIS — Z6831 Body mass index (BMI) 31.0-31.9, adult: Secondary | ICD-10-CM | POA: Diagnosis not present

## 2019-08-15 DIAGNOSIS — E6609 Other obesity due to excess calories: Secondary | ICD-10-CM | POA: Diagnosis not present

## 2019-08-15 DIAGNOSIS — Z713 Dietary counseling and surveillance: Secondary | ICD-10-CM | POA: Diagnosis not present

## 2019-08-15 DIAGNOSIS — F330 Major depressive disorder, recurrent, mild: Secondary | ICD-10-CM | POA: Diagnosis not present

## 2019-08-22 DIAGNOSIS — E6609 Other obesity due to excess calories: Secondary | ICD-10-CM | POA: Diagnosis not present

## 2019-08-22 DIAGNOSIS — Z6831 Body mass index (BMI) 31.0-31.9, adult: Secondary | ICD-10-CM | POA: Diagnosis not present

## 2019-08-22 DIAGNOSIS — Z8673 Personal history of transient ischemic attack (TIA), and cerebral infarction without residual deficits: Secondary | ICD-10-CM | POA: Diagnosis not present

## 2019-08-22 DIAGNOSIS — Z713 Dietary counseling and surveillance: Secondary | ICD-10-CM | POA: Diagnosis not present

## 2019-09-05 DIAGNOSIS — Z713 Dietary counseling and surveillance: Secondary | ICD-10-CM | POA: Diagnosis not present

## 2019-09-05 DIAGNOSIS — E6609 Other obesity due to excess calories: Secondary | ICD-10-CM | POA: Diagnosis not present

## 2019-09-05 DIAGNOSIS — Z8673 Personal history of transient ischemic attack (TIA), and cerebral infarction without residual deficits: Secondary | ICD-10-CM | POA: Diagnosis not present

## 2019-09-05 DIAGNOSIS — Z6832 Body mass index (BMI) 32.0-32.9, adult: Secondary | ICD-10-CM | POA: Diagnosis not present

## 2019-09-26 DIAGNOSIS — Z6831 Body mass index (BMI) 31.0-31.9, adult: Secondary | ICD-10-CM | POA: Diagnosis not present

## 2019-09-26 DIAGNOSIS — Z713 Dietary counseling and surveillance: Secondary | ICD-10-CM | POA: Diagnosis not present

## 2019-09-26 DIAGNOSIS — E6609 Other obesity due to excess calories: Secondary | ICD-10-CM | POA: Diagnosis not present

## 2019-09-26 DIAGNOSIS — R5382 Chronic fatigue, unspecified: Secondary | ICD-10-CM | POA: Diagnosis not present

## 2019-10-03 DIAGNOSIS — E6609 Other obesity due to excess calories: Secondary | ICD-10-CM | POA: Diagnosis not present

## 2019-10-03 DIAGNOSIS — Z8249 Family history of ischemic heart disease and other diseases of the circulatory system: Secondary | ICD-10-CM | POA: Diagnosis not present

## 2019-10-03 DIAGNOSIS — E7841 Elevated Lipoprotein(a): Secondary | ICD-10-CM | POA: Diagnosis not present

## 2019-10-03 DIAGNOSIS — Z713 Dietary counseling and surveillance: Secondary | ICD-10-CM | POA: Diagnosis not present

## 2019-10-10 DIAGNOSIS — Z713 Dietary counseling and surveillance: Secondary | ICD-10-CM | POA: Diagnosis not present

## 2019-10-10 DIAGNOSIS — Z8673 Personal history of transient ischemic attack (TIA), and cerebral infarction without residual deficits: Secondary | ICD-10-CM | POA: Diagnosis not present

## 2019-10-10 DIAGNOSIS — E6609 Other obesity due to excess calories: Secondary | ICD-10-CM | POA: Diagnosis not present

## 2019-10-10 DIAGNOSIS — Z6831 Body mass index (BMI) 31.0-31.9, adult: Secondary | ICD-10-CM | POA: Diagnosis not present

## 2019-10-24 DIAGNOSIS — E6609 Other obesity due to excess calories: Secondary | ICD-10-CM | POA: Diagnosis not present

## 2019-10-24 DIAGNOSIS — Z713 Dietary counseling and surveillance: Secondary | ICD-10-CM | POA: Diagnosis not present

## 2019-10-24 DIAGNOSIS — Z6831 Body mass index (BMI) 31.0-31.9, adult: Secondary | ICD-10-CM | POA: Diagnosis not present

## 2019-10-24 DIAGNOSIS — F330 Major depressive disorder, recurrent, mild: Secondary | ICD-10-CM | POA: Diagnosis not present

## 2019-10-31 DIAGNOSIS — Z713 Dietary counseling and surveillance: Secondary | ICD-10-CM | POA: Diagnosis not present

## 2019-10-31 DIAGNOSIS — E6609 Other obesity due to excess calories: Secondary | ICD-10-CM | POA: Diagnosis not present

## 2019-10-31 DIAGNOSIS — Z6831 Body mass index (BMI) 31.0-31.9, adult: Secondary | ICD-10-CM | POA: Diagnosis not present

## 2019-10-31 DIAGNOSIS — Z800 Family history of malignant neoplasm of digestive organs: Secondary | ICD-10-CM | POA: Diagnosis not present

## 2019-11-07 DIAGNOSIS — Z8673 Personal history of transient ischemic attack (TIA), and cerebral infarction without residual deficits: Secondary | ICD-10-CM | POA: Diagnosis not present

## 2019-11-07 DIAGNOSIS — Z713 Dietary counseling and surveillance: Secondary | ICD-10-CM | POA: Diagnosis not present

## 2019-11-07 DIAGNOSIS — E6609 Other obesity due to excess calories: Secondary | ICD-10-CM | POA: Diagnosis not present

## 2019-11-07 DIAGNOSIS — Z6831 Body mass index (BMI) 31.0-31.9, adult: Secondary | ICD-10-CM | POA: Diagnosis not present

## 2019-12-19 DIAGNOSIS — M25561 Pain in right knee: Secondary | ICD-10-CM | POA: Diagnosis not present

## 2019-12-19 DIAGNOSIS — M25562 Pain in left knee: Secondary | ICD-10-CM | POA: Diagnosis not present

## 2019-12-28 DIAGNOSIS — L03031 Cellulitis of right toe: Secondary | ICD-10-CM | POA: Diagnosis not present

## 2020-02-17 NOTE — Progress Notes (Signed)
     Established patient visit   Patient: Tammy Sosa   DOB: 06/08/1971   49 y.o. Female  MRN: [id number] Visit Date: 02/18/2020  Today's healthcare provider: Trey Sailors, PA-C   Chief Complaint  Patient presents with  . Breast Mass   Subjective    HPI  Breast Lump Patient presents today with a lump on her right breast. She reports that she has noticed it for the last 2 weeks. She does have a family history of breast cancer in her mother. She denies fevers, weight loss. Denies pain or drainage.      Medications: Outpatient Medications Prior to Visit  Medication Sig  . aspirin EC 81 MG tablet Take 81 mg by mouth.  Marland Kitchen atorvastatin (LIPITOR) 10 MG tablet Take 1 tablet (10 mg total) by mouth daily.  Marland Kitchen levonorgestrel (MIRENA) 20 MCG/24HR IUD 1 each by Intrauterine route once.  . meloxicam (MOBIC) 15 MG tablet Take 15 mg by mouth daily.  . sertraline (ZOLOFT) 100 MG tablet Take 1 tablet (100 mg total) by mouth daily.   No facility-administered medications prior to visit.    Review of Systems  Constitutional: Negative.   Respiratory: Negative.   Cardiovascular: Negative.   Endocrine: Negative.   Musculoskeletal: Negative.   Skin: Negative.   Neurological: Negative.       Objective    BP 105/71 (BP Location: Right Arm)   Pulse 61   Temp 98.4 F (36.9 C)   Ht 5\' 4"  (1.626 m)   Wt 191 lb (86.6 kg)   BMI 32.79 kg/m    Physical Exam Exam conducted with a chaperone present.  Constitutional:      Appearance: Normal appearance.  Chest:     Breasts:        Right: Mass and tenderness present.        Left: No swelling, bleeding, inverted nipple, mass, nipple discharge, skin change or tenderness.     Comments: Right breast mass at 4 o clock.  Skin:    General: Skin is warm and dry.  Neurological:     Mental Status: She is alert and oriented to person, place, and time. Mental status is at baseline.  Psychiatric:        Mood and Affect: Mood normal.         Behavior: Behavior normal.       No results found for any visits on 02/18/20.  Assessment & Plan    1. Breast lump  Mammo and ultrasound  - MM Digital Diagnostic Bilat; Future    Return if symptoms worsen or fail to improve.      I07/22/21, PA-C, have reviewed all documentation for this visit. The documentation on 02/19/20 for the exam, diagnosis, procedures, and orders are all accurate and complete.    02/21/20  [facility] [phone] (phone) [phone] (fax)  [facility]

## 2020-02-18 ENCOUNTER — Encounter: Payer: Self-pay | Admitting: Physician Assistant

## 2020-02-18 ENCOUNTER — Other Ambulatory Visit: Payer: Self-pay

## 2020-02-18 ENCOUNTER — Ambulatory Visit (INDEPENDENT_AMBULATORY_CARE_PROVIDER_SITE_OTHER): Payer: BC Managed Care – PPO | Admitting: Physician Assistant

## 2020-02-18 VITALS — BP 105/71 | HR 61 | Temp 98.4°F | Ht 64.0 in | Wt 191.0 lb

## 2020-02-18 DIAGNOSIS — N63 Unspecified lump in unspecified breast: Secondary | ICD-10-CM | POA: Diagnosis not present

## 2020-02-19 ENCOUNTER — Other Ambulatory Visit: Payer: Self-pay | Admitting: Physician Assistant

## 2020-02-19 DIAGNOSIS — N63 Unspecified lump in unspecified breast: Secondary | ICD-10-CM

## 2020-02-25 ENCOUNTER — Ambulatory Visit
Admission: RE | Admit: 2020-02-25 | Discharge: 2020-02-25 | Disposition: A | Discharge status: Home / Self Care | Payer: BC Managed Care – PPO | Source: Ambulatory Visit | Attending: Physician Assistant | Admitting: Physician Assistant

## 2020-02-25 DIAGNOSIS — R928 Other abnormal and inconclusive findings on diagnostic imaging of breast: Secondary | ICD-10-CM | POA: Diagnosis not present

## 2020-02-25 DIAGNOSIS — N63 Unspecified lump in unspecified breast: Secondary | ICD-10-CM | POA: Diagnosis not present

## 2020-02-25 DIAGNOSIS — N6489 Other specified disorders of breast: Secondary | ICD-10-CM | POA: Diagnosis not present

## 2020-04-30 DIAGNOSIS — M2391 Unspecified internal derangement of right knee: Secondary | ICD-10-CM | POA: Diagnosis not present

## 2020-05-27 NOTE — Progress Notes (Signed)
Complete physical exam   Patient: Tammy Sosa   DOB: 1971-07-28   49 y.o. Female  MRN: [id number] Visit Date: 05/28/2020  Today's healthcare provider: Trey Sailors, PA-C   Chief Complaint  Patient presents with  . Annual Exam  . Anxiety  I,Thaddus Mcdowell M Lummie Montijo,acting as a scribe for Trey Sailors, PA-C.,have documented all relevant documentation on the behalf of Trey Sailors, PA-C,as directed by  Trey Sailors, PA-C while in the presence of Trey Sailors, PA-C.  Subjective    Tammy Sosa is a 49 y.o. female who presents today for a complete physical exam.  She reports consuming a general diet. The patient does not participate in regular exercise at present. She generally feels fairly well. She reports sleeping fairly well. She does have additional problems to discuss today.  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 She is undergoing physical therapy for 6 weeks. She had a Education officer, community vaccine.   Anxiety, Follow-up  She was last seen for anxiety 1 years ago. Changes made at last visit include patient reports she stopped taking her Zoloft. She says she ran out for two months. Last Rx was sent for 90 with 3 refills 07/30/2020. Patient says she hasn't called the pharmacy.    She reports poor compliance with treatment. Due to patient stopping the Zoloft. She reports good tolerance of treatment. She is not having side effects.   She feels her anxiety is moderate and Worse since last visit.  Symptoms: No chest pain Yes difficulty concentrating  No dizziness Yes fatigue  Yes feelings of losing control No insomnia  Yes irritable No palpitations  Yes panic attacks No racing thoughts  No shortness of breath No sweating  Yes tremors/shakes    GAD-7 Results GAD-7 Generalized Anxiety Disorder Screening Tool 05/28/2020  1. Feeling Nervous, Anxious, or on Edge 2  2. Not  Being Able to Stop or Control Worrying 2  3. Worrying Too Much About Different Things 2  4. Trouble Relaxing 0  5. Being So Restless it's Hard To Sit Still 1  6. Becoming Easily Annoyed or Irritable 3  7. Feeling Afraid As If Something Awful Might Happen 1  Total GAD-7 Score 11  Difficulty At Work, Home, or Getting  Along With Others? Somewhat difficult    PHQ-9 Scores PHQ9 SCORE ONLY 05/28/2020 05/24/2019 05/02/2018  PHQ-9 Total Score BP Readings from Last 5 Encounters:  05/28/20 (!) 85/44  02/18/20 105/71  05/24/19 104/72  04/17/19 119/78  07/10/18 106/73   Obesity: Was previously seen at [facility] but the drive got to far and so she stopped going. She was placed on Phendimetrazine in an intermittent fashion for five months.   Wt Readings from Last 3 Encounters:  05/28/20 197 lb 9.6 oz (89.6 kg)  02/18/20 191 lb (86.6 kg)  05/24/19 190 lb (86.2 kg)   Lipid/Cholesterol, Follow-up  Last lipid panel Other pertinent labs  Lab Results  Component Value Date   CHOL 191 05/28/2020   HDL 50 05/28/2020   LDLCALC 129 (H) 05/28/2020   TRIG 65 05/28/2020   CHOLHDL 3.8 05/28/2020   Lab Results  Component Value Date   ALT 13 05/28/2020   AST 15 05/28/2020   PLT 265 05/28/2020   TSH 1.740 05/28/2020     She was  last seen for this 1 years ago.  Management since that visit includes continue lipitor.  She reports poor compliance with treatment. Patient says she ran out of medication. She is not having side effects.   Symptoms: No chest pain No chest pressure/discomfort  No dyspnea No lower extremity edema  No numbness or tingling of extremity No orthopnea  No palpitations No paroxysmal nocturnal dyspnea  No speech difficulty No syncope   Current diet: in general, an "unhealthy" diet Current exercise: none  The ASCVD Risk score Denman George DC Jr., et al., 2013) failed to calculate for the following reasons:   The valid systolic blood pressure range is 90 to  200 mmHg  ---------------------------------------------------------------------------------------------------  She reports abdominal pain - she reports some diarrhea. She has a history of H yplori infection. She was treated but was not retested. She reports continued issues with this. She has not been back to see GI. She was given GI contact information at 2020 CPE but reports she lost it.   She does have some lower abdominal pain which she reports has been present for 10-15 years. It is persistent in the lower pelvic area and not associated with menstruation, constipation, diarrhea or activity. She does not wish to work this up further right now.  ---------------------------------------------------------------------------------------------------   Past Medical History:  Diagnosis Date  . Abnormal Papanicolaou smear of cervix with positive human papilloma virus (HPV) test 12/11/2014   LSIL/Mild dysplasia  . Arthritis    hips  . Asthma    as young adult  . Atypical squamous cells of undetermined significance (ASCUS) on Papanicolaou smear of cervix 01/30/2013  . Complication of anesthesia    BP drops  . Family history of breast cancer in female   . Family history of ovarian cancer   . H/O arthroscopy of knee   . Herniated cervical disc   . History of conization of cervix   . History of gestational diabetes   . 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 Migraine headache    none in over 1 yr  . Motion sickness    passenger in car  . PFO (patent foramen ovale)   . Plantar fasciitis   . Stroke (HCC) 2017  . Wears contact lenses    Past Surgical History:  Procedure Laterality Date  . CERVICAL CONIZATION W/BX    . COLONOSCOPY WITH PROPOFOL N/A 06/20/2018   Procedure: COLONOSCOPY WITH PROPOFOL;  Surgeon: Toney Reil, MD;  Location: [facility];  Service: Endoscopy;  Laterality: N/A;  . ESOPHAGOGASTRODUODENOSCOPY (EGD) WITH PROPOFOL N/A 06/20/2018   Procedure:  ESOPHAGOGASTRODUODENOSCOPY (EGD) WITH PROPOFOL;  Surgeon: Toney Reil, MD;  Location[facility];  Service: Endoscopy;  Laterality: N/A;  . KNEE SURGERY Right    Social History   Socioeconomic History  . Marital status: Married    Spouse name: Not on file  . Number of children: Not on file  . Years of education: Not on file  . Highest education level: Not on file  Occupational History  . Not on file  Tobacco Use  . Smoking status: Never Smoker  . Smokeless tobacco: Never Used  Vaping Use  . Vaping Use: Never used  Substance and Sexual Activity  . Alcohol use: Yes    Alcohol/week: 4.0 standard drinks    Types: 1 Glasses of wine, 1 Cans of beer, 2 Shots of liquor per week  . Drug use: No  . Sexual activity: Yes    Birth control/protection:  I.U.D.  Other Topics Concern  . Not on file  Social History Narrative  . Not on file   Social Determinants of Health   Financial Resource Strain:   . Difficulty of Paying Living Expenses: Not on file  Food Insecurity:   . Worried About Programme researcher, broadcasting/film/videounning Out of Food in the Last Year: Not on file  . Ran Out of Food in the Last Year: Not on file  Transportation Needs:   . Lack of Transportation (Medical): Not on file  . Lack of Transportation (Non-Medical): Not on file  Physical Activity:   . Days of Exercise per Week: Not on file  . Minutes of Exercise per Session: Not on file  Stress:   . Feeling of Stress : Not on file  Social Connections:   . Frequency of Communication with Friends and Family: Not on file  . Frequency of Social Gatherings with Friends and Family: Not on file  . Attends Religious Services: Not on file  . Active Member of Clubs or Organizations: Not on file  . Attends BankerClub or Organization Meetings: Not on file  . Marital Status: Not on file  Intimate Partner Violence:   . Fear of Current or Ex-Partner: Not on file  . Emotionally Abused: Not on file  . Physically Abused: Not on file  . Sexually Abused: Not  on file   Family Status  Relation Name Status  . Mother  Alive  . Father  Alive  . Oneal GroutPat Uncle  (Not Specified)  . MGF  (Not Specified)  . PGM  Deceased   Family History  Problem Relation Age of Onset  . Breast cancer Mother 7555  . Ovarian cancer Mother 8930  . Diabetes Mother   . COPD Mother   . Hypertension Mother   . Skin cancer Father   . Stomach cancer Paternal Uncle   . Stomach cancer Maternal Grandfather   . Diabetes Paternal Grandmother    No Known Allergies  Patient Care Team: Maryella ShiversPollak, Koltyn Kelsay M, PA-C as PCP - General (Physician Assistant)   Medications: Outpatient Medications Prior to Visit  Medication Sig  . aspirin EC 81 MG tablet Take 81 mg by mouth.  . levonorgestrel (MIRENA) 20 MCG/24HR IUD 1 each by Intrauterine route once.  . meloxicam (MOBIC) 15 MG tablet Take 15 mg by mouth daily. (Patient not taking: Reported on 05/28/2020)  . [DISCONTINUED] atorvastatin (LIPITOR) 10 MG tablet Take 1 tablet (10 mg total) by mouth daily. (Patient not taking: Reported on 05/28/2020)  . [DISCONTINUED] sertraline (ZOLOFT) 100 MG tablet Take 1 tablet (100 mg total) by mouth daily. (Patient not taking: Reported on 05/28/2020)   No facility-administered medications prior to visit.    Review of Systems  Constitutional: Negative.   HENT: Negative.   Eyes: Positive for photophobia.  Respiratory: Negative.   Cardiovascular: Positive for leg swelling.  Gastrointestinal: Negative.   Endocrine: Negative.   Genitourinary: Negative.   Musculoskeletal: Negative.   Skin: Negative.   Allergic/Immunologic: Negative.   Neurological: Negative.   Hematological: Negative.   Psychiatric/Behavioral: Positive for agitation, decreased concentration and sleep disturbance. Negative for behavioral problems, confusion, self-injury and suicidal ideas. The patient is nervous/anxious. The patient is not hyperactive.       Objective    BP (!) 85/44 (BP Location: Left Arm, Patient Position:  Sitting, Cuff Size: Large)   Pulse 71   Temp 98.1 F (36.7 C) (Oral)   Ht 5\' 4"  (1.626 m)   Wt 197 lb 9.6 oz (89.6  kg)   SpO2 98%   BMI 33.92 kg/m    Physical Exam Constitutional:      Appearance: Normal appearance.  HENT:     Right Ear: Tympanic membrane, ear canal and external ear normal.     Left Ear: Tympanic membrane, ear canal and external ear normal.  Cardiovascular:     Rate and Rhythm: Normal rate and regular rhythm.     Pulses: Normal pulses.     Heart sounds: Normal heart sounds.  Pulmonary:     Effort: Pulmonary effort is normal.     Breath sounds: Normal breath sounds.  Abdominal:     General: Abdomen is flat. Bowel sounds are normal.     Palpations: Abdomen is soft.  Skin:    General: Skin is warm and dry.  Neurological:     General: No focal deficit present.     Mental Status: She is alert and oriented to person, place, and time.  Psychiatric:        Mood and Affect: Mood normal.        Behavior: Behavior normal.       Last depression screening scores PHQ 2/9 Scores 05/28/2020 05/24/2019 05/02/2018  PHQ - 2 Score 3 3 1   PHQ- 9 Score 15 17 14    Last fall risk screening Fall Risk  05/28/2020  Falls in the past year? 0  Number falls in past yr: 0  Injury with Fall? 0  Risk for fall due to : No Fall Risks  Follow up Falls evaluation completed   Last Audit-C alcohol use screening Alcohol Use Disorder Test (AUDIT) 05/28/2020  1. How often do you have a drink containing alcohol? 1  2. How many drinks containing alcohol do you have on a typical day when you are drinking? 1  3. How often do you have six or more drinks on one occasion? 0  AUDIT-C Score 2   A score of 3 or more in women, and 4 or more in men indicates increased risk for alcohol abuse, EXCEPT if all of the points are from question 1   Results for orders placed or performed in visit on 05/28/20  TSH  Result Value Ref Range   TSH 1.740 0.450 - 4.500 uIU/mL  Lipid panel  Result Value  Ref Range   Cholesterol, Total 191 100 - 199 mg/dL   Triglycerides 65 0 - 149 mg/dL   HDL 50 05/30/2020 mg/dL   VLDL Cholesterol Cal 12 5 - 40 mg/dL   LDL Chol Calc (NIH) 05/30/20 (H) 0 - 99 mg/dL   Chol/HDL Ratio 3.8 0.0 - 4.4 ratio  Comprehensive metabolic panel  Result Value Ref Range   Glucose 95 65 - 99 mg/dL   BUN 15 6 - 24 mg/dL   Creatinine, Ser >35 0.57 - 1.00 mg/dL   GFR calc non Af Amer 84 >59 mL/min/1.73   GFR calc Af Amer 96 >59 mL/min/1.73   BUN/Creatinine Ratio 18 9 - 23   Sodium 142 134 - 144 mmol/L   Potassium 3.9 3.5 - 5.2 mmol/L   Chloride 104 96 - 106 mmol/L   CO2 22 20 - 29 mmol/L   Calcium 9.7 8.7 - 10.2 mg/dL   Total Protein 6.9 6.0 - 8.5 g/dL   Albumin 4.6 3.8 - 4.8 g/dL   Globulin, Total 2.3 1.5 - 4.5 g/dL   Albumin/Globulin Ratio 2.0 1.2 - 2.2   Bilirubin Total 0.4 0.0 - 1.2 mg/dL   Alkaline Phosphatase 64 44 -  121 IU/L   AST 15 0 - 40 IU/L   ALT 13 0 - 32 IU/L  CBC with Differential/Platelet  Result Value Ref Range   WBC 8.4 3.4 - 10.8 x10E3/uL   RBC 4.31 3.77 - 5.28 x10E6/uL   Hemoglobin 12.8 11.1 - 15.9 g/dL   Hematocrit 14.7 82.9 - 46.6 %   MCV 88 79 - 97 fL   MCH 29.7 26.6 - 33.0 pg   MCHC 33.9 31 - 35 g/dL   RDW 56.2 13.0 - 86.5 %   Platelets 265 150 - 450 x10E3/uL   Neutrophils 56 Not Estab. %   Lymphs 34 Not Estab. %   Monocytes 7 Not Estab. %   Eos 2 Not Estab. %   Basos 1 Not Estab. %   Neutrophils Absolute 4.6 1.40 - 7.00 x10E3/uL   Lymphocytes Absolute 2.9 0 - 3 x10E3/uL   Monocytes Absolute 0.6 0 - 0 x10E3/uL   EOS (ABSOLUTE) 0.2 0.0 - 0.4 x10E3/uL   Basophils Absolute 0.1 0 - 0 x10E3/uL   Immature Granulocytes 0 Not Estab. %   Immature Grans (Abs) 0.0 0.0 - 0.1 x10E3/uL  Hepatitis C Antibody  Result Value Ref Range   Hep C Virus Ab <0.1 0.0 - 0.9 s/co ratio    Assessment & Plan    Routine Health Maintenance and Physical Exam  Exercise Activities and Dietary recommendations Goals   None      There is no immunization history  on file for this patient.  Health Maintenance  Topic Date Due  . COVID-19 Vaccine (1) 06/13/2020 (Originally 06/09/1983)  . INFLUENZA VACCINE  10/29/2020 (Originally 03/01/2020)  . MAMMOGRAM  02/24/2021  . TETANUS/TDAP  11/29/2021  . PAP SMEAR-Modifier  01/26/2022  . COLONOSCOPY  06/21/2023  . Hepatitis C Screening  Completed  . HIV Screening  Completed    Discussed health benefits of physical activity, and encouraged her to engage in regular exercise appropriate for her age and condition.  1. Annual physical exam  - TSH - Lipid panel - Comprehensive metabolic panel - CBC with Differential/Platelet  2. Anxiety and depression  Continue.   - sertraline (ZOLOFT) 100 MG tablet; Take 1 tablet (100 mg total) by mouth daily.  Dispense: 90 tablet; Refill: 3  3. 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   - atorvastatin (LIPITOR) 10 MG tablet; Take 1 tablet (10 mg total) by mouth daily.  Dispense: 90 tablet; Refill: 3  4. Encounter for hepatitis C screening test for low risk patient  - Hepatitis C Antibody  5. H. pylori infection  Gave contact for Gi doctor. Also let patient know she can find the contact and doctor through her MyChart as well.   6. Gastroesophageal reflux disease, unspecified whether esophagitis present  Follow up Gi.   7. Lower abdominal pain  Patient defers workup this time.   8. Encounter for screening mammogram for malignant neoplasm of breast  - MM Digital Screening; Future  9. Hyperlipidemia  Continue statin.   Return in about 1 year (around 05/28/2021) for CPE and HTN and depression .     ITrey Sailors, PA-C, have reviewed all documentation for this visit. The documentation on 05/29/20 for the exam, diagnosis, procedures, and orders are all accurate and complete.  The entirety of the information documented in the History of Present Illness, Review of Systems and Physical Exam were personally  obtained by me. Portions of this information were initially documented  by Judithann Sauger McClurkin and reviewed by me for thoroughness and accuracy.     Maryella Shivers  [facility] [phone] (phone) [phone] (fax)  [facility]

## 2020-05-28 ENCOUNTER — Encounter: Payer: Self-pay | Admitting: Physician Assistant

## 2020-05-28 ENCOUNTER — Other Ambulatory Visit: Payer: Self-pay

## 2020-05-28 ENCOUNTER — Ambulatory Visit (INDEPENDENT_AMBULATORY_CARE_PROVIDER_SITE_OTHER): Payer: BC Managed Care – PPO | Admitting: Physician Assistant

## 2020-05-28 VITALS — BP 85/44 | HR 71 | Temp 98.1°F | Ht 64.0 in | Wt 197.6 lb

## 2020-05-28 DIAGNOSIS — E785 Hyperlipidemia, unspecified: Secondary | ICD-10-CM

## 2020-05-28 DIAGNOSIS — Z1159 Encounter for screening for other viral diseases: Secondary | ICD-10-CM

## 2020-05-28 DIAGNOSIS — Z1231 Encounter for screening mammogram for malignant neoplasm of breast: Secondary | ICD-10-CM

## 2020-05-28 DIAGNOSIS — I6349 Cerebral infarction due to embolism of other cerebral artery: Secondary | ICD-10-CM

## 2020-05-28 DIAGNOSIS — F32A Depression, unspecified: Secondary | ICD-10-CM

## 2020-05-28 DIAGNOSIS — Z0000 Encounter for general adult medical examination without abnormal findings: Secondary | ICD-10-CM

## 2020-05-28 DIAGNOSIS — K219 Gastro-esophageal reflux disease without esophagitis: Secondary | ICD-10-CM | POA: Diagnosis not present

## 2020-05-28 DIAGNOSIS — A048 Other specified bacterial intestinal infections: Secondary | ICD-10-CM | POA: Diagnosis not present

## 2020-05-28 DIAGNOSIS — F419 Anxiety disorder, unspecified: Secondary | ICD-10-CM | POA: Diagnosis not present

## 2020-05-28 DIAGNOSIS — R1030 Lower abdominal pain, unspecified: Secondary | ICD-10-CM

## 2020-05-28 MED ORDER — ATORVASTATIN CALCIUM 10 MG PO TABS: 10.0000 mg | ORAL_TABLET | Freq: Every day | ORAL | 3 refills | Status: DC

## 2020-05-28 MED ORDER — SERTRALINE HCL 100 MG PO TABS: 100.0000 mg | ORAL_TABLET | Freq: Every day | ORAL | 3 refills | Status: DC

## 2020-05-28 NOTE — Patient Instructions (Addendum)
[facility] - [phone]   Health Maintenance, Female Adopting a healthy lifestyle and getting preventive care are important in promoting health and wellness. Ask your health care provider about:  The right schedule for you to have regular tests and exams.  Things you can do on your own to prevent diseases and keep yourself healthy. What should I know about diet, weight, and exercise? Eat a healthy diet   Eat a diet that includes plenty of vegetables, fruits, low-fat dairy products, and lean protein.  Do not eat a lot of foods that are high in solid fats, added sugars, or sodium. Maintain a healthy weight Body mass index (BMI) is used to identify weight problems. It estimates body fat based on height and weight. Your health care provider can help determine your BMI and help you achieve or maintain a healthy weight. Get regular exercise Get regular exercise. This is one of the most important things you can do for your health. Most adults should:  Exercise for at least 150 minutes each week. The exercise should increase your heart rate and make you sweat (moderate-intensity exercise).  Do strengthening exercises at least twice a week. This is in addition to the moderate-intensity exercise.  Spend less time sitting. Even light physical activity can be beneficial. Watch cholesterol and blood lipids Have your blood tested for lipids and cholesterol at 47 years of age, then have this test every 5 years. Have your cholesterol levels checked more often if:  Your lipid or cholesterol levels are high.  You are older than 47 years of age.  You are at high risk for heart disease. What should I know about cancer screening? Depending on your health history and family history, you may need to have cancer screening at various ages. This may include screening for:  Breast cancer.  Cervical cancer.  Colorectal cancer.  Skin cancer.  Lung cancer. What should I know about  heart disease, diabetes, and high blood pressure? Blood pressure and heart disease  High blood pressure causes heart disease and increases the risk of stroke. This is more likely to develop in people who have high blood pressure readings, are of African descent, or are overweight.  Have your blood pressure checked: ? Every 3-5 years if you are 81-10 years of age. ? Every year if you are 61 years old or older. Diabetes Have regular diabetes screenings. This checks your fasting blood sugar level. Have the screening done:  Once every three years after age 39 if you are at a normal weight and have a low risk for diabetes.  More often and at a younger age if you are overweight or have a high risk for diabetes. What should I know about preventing infection? Hepatitis B If you have a higher risk for hepatitis B, you should be screened for this virus. Talk with your health care provider to find out if you are at risk for hepatitis B infection. Hepatitis C Testing is recommended for:  Everyone born from [birth year] through 1963.  Anyone with known risk factors for hepatitis C. Sexually transmitted infections (STIs)  Get screened for STIs, including gonorrhea and chlamydia, if: ? You are sexually active and are younger than 47 years of age. ? You are older than 47 years of age and your health care provider tells you that you are at risk for this type of infection. ? Your sexual activity has changed since you were last screened, and you are at increased risk for chlamydia or gonorrhea.  Ask your health care provider if you are at risk.  Ask your health care provider about whether you are at high risk for HIV. Your health care provider may recommend a prescription medicine to help prevent HIV infection. If you choose to take medicine to prevent HIV, you should first get tested for HIV. You should then be tested every 3 months for as long as you are taking the medicine. Pregnancy  If you are about to  stop having your period (premenopausal) and you may become pregnant, seek counseling before you get pregnant.  Take 400 to 800 micrograms (mcg) of folic acid every day if you become pregnant.  Ask for birth control (contraception) if you want to prevent pregnancy. Osteoporosis and menopause Osteoporosis is a disease in which the bones lose minerals and strength with aging. This can result in bone fractures. If you are 7 years old or older, or if you are at risk for osteoporosis and fractures, ask your health care provider if you should:  Be screened for bone loss.  Take a calcium or vitamin D supplement to lower your risk of fractures.  Be given hormone replacement therapy (HRT) to treat symptoms of menopause. Follow these instructions at home: Lifestyle  Do not use any products that contain nicotine or tobacco, such as cigarettes, e-cigarettes, and chewing tobacco. If you need help quitting, ask your health care provider.  Do not use street drugs.  Do not share needles.  Ask your health care provider for help if you need support or information about quitting drugs. Alcohol use  Do not drink alcohol if: ? Your health care provider tells you not to drink. ? You are pregnant, may be pregnant, or are planning to become pregnant.  If you drink alcohol: ? Limit how much you use to 0-1 drink a day. ? Limit intake if you are breastfeeding.  Be aware of how much alcohol is in your drink. In the [place] one drink equals one 12 oz bottle of beer (355 mL), one 5 oz glass of wine (148 mL), or one 1 oz glass of hard liquor (44 mL). General instructions  Schedule regular health, dental, and eye exams.  Stay current with your vaccines.  Tell your health care provider if: ? You often feel depressed. ? You have ever been abused or do not feel safe at home. Summary  Adopting a healthy lifestyle and getting preventive care are important in promoting health and wellness.  Follow your  health care provider's instructions about healthy diet, exercising, and getting tested or screened for diseases.  Follow your health care provider's instructions on monitoring your cholesterol and blood pressure. This information is not intended to replace advice given to you by your health care provider. Make sure you discuss any questions you have with your health care provider. Document Revised: 07/11/2018 Document Reviewed: 07/11/2018 Elsevier Patient Education  2020 [employer].

## 2020-05-29 LAB — TSH: TSH: 1.74 u[IU]/mL (ref 0.450–4.500)

## 2020-05-29 LAB — CBC WITH DIFFERENTIAL/PLATELET
Basophils Absolute: 0.1 10*3/uL (ref 0.0–0.2)
Basos: 1 %
EOS (ABSOLUTE): 0.2 10*3/uL (ref 0.0–0.4)
Eos: 2 %
Hematocrit: 37.8 % (ref 34.0–46.6)
Hemoglobin: 12.8 g/dL (ref 11.1–15.9)
Immature Grans (Abs): 0 10*3/uL (ref 0.0–0.1)
Immature Granulocytes: 0 %
Lymphocytes Absolute: 2.9 10*3/uL (ref 0.7–3.1)
Lymphs: 34 %
MCH: 29.7 pg (ref 26.6–33.0)
MCHC: 33.9 g/dL (ref 31.5–35.7)
MCV: 88 fL (ref 79–97)
Monocytes Absolute: 0.6 10*3/uL (ref 0.1–0.9)
Monocytes: 7 %
Neutrophils Absolute: 4.6 10*3/uL (ref 1.4–7.0)
Neutrophils: 56 %
Platelets: 265 10*3/uL (ref 150–450)
RBC: 4.31 x10E6/uL (ref 3.77–5.28)
RDW: 12.7 % (ref 11.7–15.4)
WBC: 8.4 10*3/uL (ref 3.4–10.8)

## 2020-05-29 LAB — COMPREHENSIVE METABOLIC PANEL
ALT: 13 IU/L (ref 0–32)
AST: 15 IU/L (ref 0–40)
Albumin/Globulin Ratio: 2 (ref 1.2–2.2)
Albumin: 4.6 g/dL (ref 3.8–4.8)
Alkaline Phosphatase: 64 IU/L (ref 44–121)
BUN/Creatinine Ratio: 18 (ref 9–23)
BUN: 15 mg/dL (ref 6–24)
Bilirubin Total: 0.4 mg/dL (ref 0.0–1.2)
CO2: 22 mmol/L (ref 20–29)
Calcium: 9.7 mg/dL (ref 8.7–10.2)
Chloride: 104 mmol/L (ref 96–106)
Creatinine, Ser: 0.84 mg/dL (ref 0.57–1.00)
GFR calc Af Amer: 96 mL/min/{1.73_m2} (ref >59)
GFR calc non Af Amer: 84 mL/min/{1.73_m2} (ref >59)
Globulin, Total: 2.3 g/dL (ref 1.5–4.5)
Glucose: 95 mg/dL (ref 65–99)
Potassium: 3.9 mmol/L (ref 3.5–5.2)
Sodium: 142 mmol/L (ref 134–144)
Total Protein: 6.9 g/dL (ref 6.0–8.5)

## 2020-05-29 LAB — HEPATITIS C ANTIBODY: Hep C Virus Ab: <0.1 s/co ratio (ref 0.0–0.9)

## 2020-05-29 LAB — LIPID PANEL
Chol/HDL Ratio: 3.8 ratio (ref 0.0–4.4)
Cholesterol, Total: 191 mg/dL (ref 100–199)
HDL: 50 mg/dL (ref >39)
LDL Chol Calc (NIH): 129 mg/dL — ABNORMAL HIGH (ref 0–99)
Triglycerides: 65 mg/dL (ref 0–149)
VLDL Cholesterol Cal: 12 mg/dL (ref 5–40)

## 2020-07-22 NOTE — Telephone Encounter (Signed)
.  mychart

## 2020-09-17 ENCOUNTER — Other Ambulatory Visit: Payer: Self-pay

## 2020-09-17 ENCOUNTER — Ambulatory Visit (INDEPENDENT_AMBULATORY_CARE_PROVIDER_SITE_OTHER): Payer: BC Managed Care – PPO | Admitting: Nurse Practitioner

## 2020-09-17 ENCOUNTER — Encounter: Payer: Self-pay | Admitting: Nurse Practitioner

## 2020-09-17 VITALS — BP 110/70 | HR 59 | Ht 64.0 in | Wt 192.2 lb

## 2020-09-17 DIAGNOSIS — Q2112 Patent foramen ovale: Secondary | ICD-10-CM

## 2020-09-17 DIAGNOSIS — Q211 Atrial septal defect: Secondary | ICD-10-CM | POA: Diagnosis not present

## 2020-09-17 DIAGNOSIS — R079 Chest pain, unspecified: Secondary | ICD-10-CM

## 2020-09-17 DIAGNOSIS — E782 Mixed hyperlipidemia: Secondary | ICD-10-CM

## 2020-09-17 NOTE — Patient Instructions (Signed)
Medication Instructions:  Your physician recommends that you continue on your current medications as directed. Please refer to the Current Medication list given to you today.  *If you need a refill on your cardiac medications before your next appointment, please call your pharmacy*  Follow-Up: At [facility], you and your health needs are our priority.  As part of our continuing mission to provide you with exceptional heart care, we have created designated Provider Care Teams.  These Care Teams include your primary Cardiologist (physician) and Advanced Practice Providers (APPs -  Physician Assistants and Nurse Practitioners) who all work together to provide you with the care you need, when you need it.  We recommend signing up for the patient portal called "MyChart".  Sign up information is provided on this After Visit Summary.  MyChart is used to connect with patients for Virtual Visits (Telemedicine).  Patients are able to view lab/test results, encounter notes, upcoming appointments, etc.  Non-urgent messages can be sent to your provider as well.   To learn more about what you can do with MyChart, go to [email or web address].    Your next appointment:   12 month(s)  The format for your next appointment:   In Person  Provider:   You may see Julien Nordmann, MD or one of the following Advanced Practice Providers on your designated Care Team:    Nicolasa Ducking, NP  Eula Listen, PA-C  Marisue Ivan, PA-C  Cadence Lepanto, [place]  Gillian Shields, NP

## 2020-09-17 NOTE — Progress Notes (Signed)
Office Visit    Patient Name: Tammy Sosa Date of Encounter: 09/17/2020  Primary Care Provider:  Trey Sailors, PA-C Primary Cardiologist:  Julien Nordmann, MD  Chief Complaint    50 year old female with a history of stroke in the setting of PFO in February 2017, and history of migraines, who presents for follow-up related to atypical chest pain.  Past Medical History    Past Medical History:  Diagnosis Date  . Abnormal Papanicolaou smear of cervix with positive human papilloma virus (HPV) test 12/11/2014   LSIL/Mild dysplasia  . Arthritis    hips  . Asthma    as young adult  . Atypical chest pain    a. sharp/fleeting  . Atypical squamous cells of undetermined significance (ASCUS) on Papanicolaou smear of cervix 01/30/2013  . Complication of anesthesia    BP drops  . Family history of breast cancer in female   . Family history of ovarian cancer   . H/O arthroscopy of knee   . Herniated cervical disc   . History of conization of cervix   . History of gestational diabetes   . 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 Migraine headache    none in over 1 yr  . Motion sickness    passenger in car  . PFO (patent foramen ovale)    a. 08/2015 TEE:EF 60%, no rwma, large PFO w/ R->L shunt; b. 05/2019 Echo: EF 60-65%, nl RV size/fxn, nl PASP. Neg bubble study.  . Plantar fasciitis   . Stroke [facility]) 2017   a. 08/2015 in setting of PFO-->ASA 81mg  daily.  . Wears contact lenses    Past Surgical History:  Procedure Laterality Date  . CERVICAL CONIZATION W/BX    . COLONOSCOPY WITH PROPOFOL N/A 06/20/2018   Procedure: COLONOSCOPY WITH PROPOFOL;  Surgeon: 06/22/2018, MD;  Location[facility];  Service: Endoscopy;  Laterality: N/A;  . ESOPHAGOGASTRODUODENOSCOPY (EGD) WITH PROPOFOL N/A 06/20/2018   Procedure: ESOPHAGOGASTRODUODENOSCOPY (EGD) WITH PROPOFOL;  Surgeon: 06/22/2018, MD;  Location: [facility];  Service: Endoscopy;  Laterality:  N/A;  . KNEE SURGERY Right     Allergies  No Known Allergies  History of Present Illness    50 year old female with the above past medical history including prior stroke, PFO, and migraines.  In January 2017, she was admitted with acute right-sided weakness and headache.  MRI showed a very small acute nonhemorrhagic infarct of the left subinsular region and left parietal region as well as tiny acute nonhemorrhagic infarcts in the posterior left operculum region..  Echocardiogram showed normal LV function.  Transesophageal echocardiogram showed an EF of 60% with a large PFO with right-to-left shunt.  She was treated with aspirin and statin therapy with recommendation for PFO closure.  She was evaluated at [facility] with recommendation for Holter monitoring and consideration for PFO closure, but after stopping oral contraceptives, it was felt that risk of recurrent stroke was low and she was advised to simply cont ASA therapy.    She established care with [facility]. [facility] in September 2020, after experiencing left-sided chest pain and left arm pain after putting a cork back in a bottle.  She was initially evaluated at [facility] in the emergency department with an unremarkable work-up.  Symptoms resolved with Toradol.  Overall, symptoms were not felt to be cardiac in origin and no additional ischemic evaluation was undertaken.  A repeat echocardiogram was performed in October 2020 showing EF of 60 to  65% with normal RV size and function, and a negative bubble study.  Since her last visit, she has done reasonably well from a cardiac standpoint.  She works at [employer] as a Production assistant, radio in [place] and notes that she is on her feet for long periods of time.  She often gets home very late at night and might only be able to sleep for 5 hours before having to get up to take her children to school.  In that setting, she often feels fatigued during the day.  She previously had a sleep study a year or 2 ago and was told that  she does not have sleep apnea.  She does not typically experience symptoms or limitations while at work or at [employer], which she has been going to over the past few months.  Over the past week, she has noted intermittent, fleeting and sharp left upper chest pain without associated symptoms lasting a second or so, and resolving spontaneously.  There is no rhyme or reason to when this might occur.  She denies palpitations, PND, orthopnea, syncope, edema, or early satiety.  She sometimes experiences lightheadedness if she gets up too quickly and notes that she has a history of low blood pressure.  Home Medications    Prior to Admission medications   Medication Sig Start Date End Date Taking? Authorizing Provider  aspirin EC 81 MG tablet Take 81 mg by mouth.    [provider]  atorvastatin (LIPITOR) 10 MG tablet Take 1 tablet (10 mg total) by mouth daily. 05/28/20   Trey Sailors, PA-C  levonorgestrel (MIRENA) 20 MCG/24HR IUD 1 each by Intrauterine route once.    [provider]  meloxicam (MOBIC) 15 MG tablet Take 15 mg by mouth daily. Patient not taking: Reported on 05/28/2020 12/19/19   [provider]  sertraline (ZOLOFT) 100 MG tablet Take 1 tablet (100 mg total) by mouth daily. 05/28/20 05/28/21  Trey Sailors, PA-C    Review of Systems    She has been having intermittent sharp and fleeting left-sided chest discomfort over the past week.  She notes a history of low blood pressures and orthostatic lightheadedness if she gets up too fast-this is chronic and unchanged.  She denies palpitations, dyspnea, PND, orthopnea, syncope, edema, or early satiety..  All other systems reviewed and are otherwise negative except as noted above.  Physical Exam    VS:  BP 110/70 (BP Location: Left Arm, Patient Position: Sitting, Cuff Size: Normal)   Pulse (!) 59   Ht 5\' 4"  (1.626 m)   Wt 192 lb 4 oz (87.2 kg)   SpO2 98%   BMI 33.00 kg/m  , BMI Body mass index is 33  kg/m. GEN: Well nourished, well developed, in no acute distress. HEENT: normal. Neck: Supple, no JVD, carotid bruits, or masses. Cardiac: RRR, no murmurs, rubs, or gallops. No clubbing, cyanosis, edema.  Radials/PT 2+ and equal bilaterally.  Respiratory:  Respirations regular and unlabored, clear to auscultation bilaterally. GI: Soft, nontender, nondistended, BS + x 4. MS: no deformity or atrophy. Skin: warm and dry, no rash. Neuro:  Strength and sensation are intact. Psych: Normal affect.  Accessory Clinical Findings    ECG personally reviewed by me today -sinus bradycardia, 59- no acute changes.  Lab Results  Component Value Date   WBC 8.4 05/28/2020   HGB 12.8 05/28/2020   HCT 37.8 05/28/2020   MCV 88 05/28/2020   PLT 265 05/28/2020   Lab Results  Component Value Date   CREATININE 0.84 05/28/2020   BUN 15 05/28/2020   NA 142 05/28/2020   K 3.9 05/28/2020   CL 104 05/28/2020   CO2 22 05/28/2020   Lab Results  Component Value Date   ALT 13 05/28/2020   AST 15 05/28/2020   ALKPHOS 64 05/28/2020   BILITOT 0.4 05/28/2020   Lab Results  Component Value Date   CHOL 191 05/28/2020   HDL 50 05/28/2020   LDLCALC 129 (H) 05/28/2020   TRIG 65 05/28/2020   CHOLHDL 3.8 05/28/2020    Lab Results  Component Value Date   HGBA1C 5.3 08/23/2015    Assessment & Plan    1.  Atypical chest pain: Patient with a prior history of atypical chest pain who over the past week has been experiencing intermittent, fleeting and sharp left-sided chest discomfort without associated symptoms lasting a few seconds and resolving spontaneously.  She has not been experiencing the symptoms with activity and has been tolerating workouts at the gym (elliptical x15 minutes followed by weight lifting).  Her ECG is normal.  Previous echocardiogram October 2020 showed normal LV function without regional wall motion abnormalities.  We discussed options for management to potentially include an exercise  treadmill test however, she is reassured by our discussion today and normal ECG, and would like to forego testing at this time, which I think is perfectly reasonable.  She will contact us if symptoms become more bothersome or change in character.  2.  History of PFO: Noted on transesophageal echocardiogram in the setting of stroke in 2017.  She was evaluated at [facility] with recommendation for conservative therapy.  She has been off of oral contraceptives since then.  She remains on low-dose aspirin and statin therapy.  3.  Hyperlipidemia: LDL 129 in October 2021.  She says she has been off of her Lipitor but has recently resumed.  Encouraged compliance as well as regular exercise and weight loss.  4.  Disposition: Follow-up in 1 year or sooner if necessary.   Nicolasa Ducking, NP 09/17/2020, 10:45 AM

## 2020-10-08 ENCOUNTER — Ambulatory Visit: Payer: Self-pay | Admitting: *Deleted

## 2020-10-08 ENCOUNTER — Emergency Department: Payer: BC Managed Care – PPO

## 2020-10-08 ENCOUNTER — Encounter: Payer: Self-pay | Admitting: Emergency Medicine

## 2020-10-08 ENCOUNTER — Other Ambulatory Visit: Payer: Self-pay

## 2020-10-08 ENCOUNTER — Emergency Department
Admission: EM | Admit: 2020-10-08 | Discharge: 2020-10-08 | Disposition: A | Discharge status: Home / Self Care | Payer: BC Managed Care – PPO | Attending: Emergency Medicine | Admitting: Emergency Medicine

## 2020-10-08 DIAGNOSIS — R079 Chest pain, unspecified: Secondary | ICD-10-CM

## 2020-10-08 DIAGNOSIS — R0789 Other chest pain: Secondary | ICD-10-CM | POA: Diagnosis not present

## 2020-10-08 DIAGNOSIS — Z7982 Long term (current) use of aspirin: Secondary | ICD-10-CM | POA: Diagnosis not present

## 2020-10-08 DIAGNOSIS — R0602 Shortness of breath: Secondary | ICD-10-CM | POA: Insufficient documentation

## 2020-10-08 DIAGNOSIS — M79661 Pain in right lower leg: Secondary | ICD-10-CM | POA: Insufficient documentation

## 2020-10-08 DIAGNOSIS — Z8673 Personal history of transient ischemic attack (TIA), and cerebral infarction without residual deficits: Secondary | ICD-10-CM | POA: Insufficient documentation

## 2020-10-08 DIAGNOSIS — R001 Bradycardia, unspecified: Secondary | ICD-10-CM | POA: Diagnosis not present

## 2020-10-08 DIAGNOSIS — R042 Hemoptysis: Secondary | ICD-10-CM | POA: Insufficient documentation

## 2020-10-08 DIAGNOSIS — R040 Epistaxis: Secondary | ICD-10-CM | POA: Diagnosis not present

## 2020-10-08 DIAGNOSIS — R519 Headache, unspecified: Secondary | ICD-10-CM | POA: Diagnosis not present

## 2020-10-08 DIAGNOSIS — J45909 Unspecified asthma, uncomplicated: Secondary | ICD-10-CM | POA: Insufficient documentation

## 2020-10-08 DIAGNOSIS — Z79899 Other long term (current) drug therapy: Secondary | ICD-10-CM | POA: Insufficient documentation

## 2020-10-08 LAB — TROPONIN I (HIGH SENSITIVITY): Troponin I (High Sensitivity): <2 ng/L (ref <18)

## 2020-10-08 LAB — COMPREHENSIVE METABOLIC PANEL
ALT: 20 U/L (ref 0–44)
AST: 30 U/L (ref 15–41)
Albumin: 4.4 g/dL (ref 3.5–5.0)
Alkaline Phosphatase: 52 U/L (ref 38–126)
Anion gap: 8 (ref 5–15)
BUN: 11 mg/dL (ref 6–20)
CO2: 27 mmol/L (ref 22–32)
Calcium: 9.6 mg/dL (ref 8.9–10.3)
Chloride: 104 mmol/L (ref 98–111)
Creatinine, Ser: 0.74 mg/dL (ref 0.44–1.00)
GFR, Estimated: >60 mL/min (ref >60)
Glucose, Bld: 117 mg/dL — ABNORMAL HIGH (ref 70–99)
Potassium: 3.6 mmol/L (ref 3.5–5.1)
Sodium: 139 mmol/L (ref 135–145)
Total Bilirubin: 0.6 mg/dL (ref 0.3–1.2)
Total Protein: 7.9 g/dL (ref 6.5–8.1)

## 2020-10-08 LAB — D-DIMER, QUANTITATIVE: D-Dimer, Quant: 0.59 ug/mL-FEU — ABNORMAL HIGH (ref 0.00–0.50)

## 2020-10-08 LAB — BRAIN NATRIURETIC PEPTIDE: B Natriuretic Peptide: 36.5 pg/mL (ref 0.0–100.0)

## 2020-10-08 MED ORDER — MORPHINE SULFATE (PF) 4 MG/ML IV SOLN
4.0000 mg | Freq: Once | INTRAVENOUS | Status: AC
  Administered 2020-10-08: 4 mg via INTRAVENOUS
  Filled 2020-10-08: qty 1

## 2020-10-08 MED ORDER — IOHEXOL 350 MG/ML SOLN
75.0000 mL | Freq: Once | INTRAVENOUS | Status: AC | PRN
  Administered 2020-10-08: 75 mL via INTRAVENOUS
  Filled 2020-10-08: qty 75

## 2020-10-08 MED ORDER — ONDANSETRON HCL 4 MG/2ML IJ SOLN
4.0000 mg | Freq: Once | INTRAMUSCULAR | Status: AC
  Administered 2020-10-08: 4 mg via INTRAVENOUS
  Filled 2020-10-08: qty 2

## 2020-10-08 MED ORDER — SODIUM CHLORIDE 0.9 % IV BOLUS
1000.0000 mL | Freq: Once | INTRAVENOUS | Status: AC
  Administered 2020-10-08: 1000 mL via INTRAVENOUS

## 2020-10-08 NOTE — Telephone Encounter (Signed)
Patient is calling to report she had nose bleed this morning- she was able to get it stopped- but now has severe headache an d dizziness- advised ED for severe headache and dizziness.  Reason for Disposition . Dizziness or lightheadedness . [1] SEVERE headache (e.g., excruciating) AND [2] not improved after 2 hours of pain medicine  Answer Assessment - Initial Assessment Questions 1. AMOUNT OF BLEEDING: "How bad is the bleeding?" "How much blood was lost?" "Has the bleeding stopped?"   - MILD: needed a couple tissues   - MODERATE: needed many tissues   - SEVERE: large blood clots, soaked many tissues, lasted more than 30 minutes     moderate 2. ONSET: "When did the nosebleed start?"     Last night- 2am 3. FREQUENCY: "How many nosebleeds have you had in the last 24 hours?"     1 4. RECURRENT SYMPTOMS: "Have there been other recent nosebleeds?" If Yes, ask: "How long did it take you to stop the bleeding?" "What worked best?"      No- 15 minutes 5. CAUSE: "What do you think caused this nosebleed?"     Unsur 6. LOCAL FACTORS: "Do you have any cold symptoms?", "Have you been rubbing or picking at your nose?"     no 7. SYSTEMIC FACTORS: "Do you have high blood pressure or any bleeding problems?"     no 8. BLOOD THINNERS: "Do you take any blood thinners?" (e.g., coumadin, heparin, aspirin, Plavix)     Baby Asprin  9. OTHER SYMPTOMS: "Do you have any other symptoms?" (e.g., lightheadedness)     Headache now, nose pain, dizzy earlier today 10. PREGNANCY: "Is there any chance you are pregnant?" "When was your last menstrual period?"       N/a- cycle now  Protocols used: NOSEBLEED-A-AH, HEADACHE-A-AH

## 2020-10-08 NOTE — ED Notes (Addendum)
See triage note  Presents with some chest discomfort   States she developed a nose bleed and then developed a cough  States she coughed up small blood clot  No bleeding noted at present   Cont's to have h/a and some chest discomfort

## 2020-10-08 NOTE — ED Provider Notes (Signed)
[facility] Emergency Department Provider Note  ____________________________________________   Event Date/Time   First MD Initiated Contact with Patient 10/08/20 1434     (approximate)  I have reviewed the triage vital signs and the nursing notes.   HISTORY  Chief Complaint Hemoptysis and Headache    HPI Tammy Sosa is a 50 y.o. female presents emergency department complaining of a nosebleed which caused her to cough up blood and chest pain with some shortness of breath.  Also has right lower leg pain.  She is very concerned and now has a headache.  Patient has history of a stroke.  She takes ASA per day.    Past Medical History:  Diagnosis Date  . Abnormal Papanicolaou smear of cervix with positive human papilloma virus (HPV) test 12/11/2014   LSIL/Mild dysplasia  . Arthritis    hips  . Asthma    as young adult  . Atypical chest pain    a. sharp/fleeting  . Atypical squamous cells of undetermined significance (ASCUS) on Papanicolaou smear of cervix 01/30/2013  . Complication of anesthesia    BP drops  . Family history of breast cancer in female   . Family history of ovarian cancer   . H/O arthroscopy of knee   . Herniated cervical disc   . History of conization of cervix   . History of gestational diabetes   . 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 Migraine headache    none in over 1 yr  . Motion sickness    passenger in car  . PFO (patent foramen ovale)    a. 08/2015 TEE:EF 60%, no rwma, large PFO w/ R->L shunt; b. 05/2019 Echo: EF 60-65%, nl RV size/fxn, nl PASP. Neg bubble study.  . Plantar fasciitis   . Stroke [facility]) 2017   a. 08/2015 in setting of PFO-->ASA 81mg  daily.  . Wears contact lenses     Patient Active Problem List   Diagnosis Date Noted  . PFO (patent foramen ovale) 04/16/2019  . H. pylori infection 07/10/2018  . Obesity (BMI 30.0-34.9) 11/29/2017  . Anxiety and depression 09/28/2017  . Cerebral infarction  (HCC) 08/23/2015  . Right sided weakness 08/22/2015  . Headache 08/22/2015  . Other specified disorders of muscle 08/22/2015    Past Surgical History:  Procedure Laterality Date  . CERVICAL CONIZATION W/BX    . COLONOSCOPY WITH PROPOFOL N/A 06/20/2018   Procedure: COLONOSCOPY WITH PROPOFOL;  Surgeon: 06/22/2018, MD;  Location: [facility];  Service: Endoscopy;  Laterality: N/A;  . ESOPHAGOGASTRODUODENOSCOPY (EGD) WITH PROPOFOL N/A 06/20/2018   Procedure: ESOPHAGOGASTRODUODENOSCOPY (EGD) WITH PROPOFOL;  Surgeon: 06/22/2018, MD;  Location: [facility];  Service: Endoscopy;  Laterality: N/A;  . KNEE SURGERY Right     Prior to Admission medications   Medication Sig Start Date End Date Taking? Authorizing Provider  aspirin EC 81 MG tablet Take 81 mg by mouth.    [provider]  atorvastatin (LIPITOR) 10 MG tablet Take 1 tablet (10 mg total) by mouth daily. 05/28/20   05/30/20, PA-C  levonorgestrel (MIRENA) 20 MCG/24HR IUD 1 each by Intrauterine route once.    [provider]  meloxicam (MOBIC) 15 MG tablet Take 15 mg by mouth daily. 12/19/19   [provider]  sertraline (ZOLOFT) 100 MG tablet Take 1 tablet (100 mg total) by mouth daily. 05/28/20 05/28/21  05/30/21, PA-C    Allergies Patient has no known  allergies.  Family History  Problem Relation Age of Onset  . Breast cancer Mother 23  . Ovarian cancer Mother 26  . Diabetes Mother   . COPD Mother   . Hypertension Mother   . Skin cancer Father   . Stomach cancer Paternal Uncle   . Stomach cancer Maternal Grandfather   . Diabetes Paternal Grandmother     Social History Social History   Tobacco Use  . Smoking status: Never Smoker  . Smokeless tobacco: Never Used  Vaping Use  . Vaping Use: Never used  Substance Use Topics  . Alcohol use: Yes    Alcohol/week: 4.0 standard drinks    Types: 1 Glasses of wine, 1 Cans of beer, 2 Shots of liquor per  week  . Drug use: No    Review of Systems  Constitutional: No fever/chills Eyes: No visual changes. ENT: No sore throat.  Positive nosebleed Respiratory: Positive cough Cardiovascular: Positive chest pain Gastrointestinal: Denies abdominal pain Genitourinary: Negative for dysuria. Musculoskeletal: Negative for back pain.  Positive for right lower leg pain Skin: Negative for rash. Psychiatric: no mood changes,     ____________________________________________   PHYSICAL EXAM:  VITAL SIGNS: ED Triage Vitals [10/08/20 1227]  Enc Vitals Group     BP 108/69     Pulse Rate 64     Resp 17     Temp 98.3 F (36.8 C)     Temp Source Oral     SpO2 97 %     Weight 190 lb (86.2 kg)     Height 5\' 4"  (1.626 m)     Head Circumference      Peak Flow      Pain Score 8     Pain Loc      Pain Edu?      Excl. in GC?     Constitutional: Alert and oriented. Well appearing and in no acute distress. Eyes: Conjunctivae are normal.  Head: Atraumatic. Nose: No congestion/rhinnorhea.  Nasal mucosa swelling, no active bleeding noted Mouth/Throat: Mucous membranes are moist.   Neck:  supple no lymphadenopathy noted Cardiovascular: Normal rate, regular rhythm. Heart sounds are normal Respiratory: Normal respiratory effort.  No retractions, lungs c t a  GU: deferred Musculoskeletal: FROM all extremities, warm and well perfused Neurologic:  Normal speech and language.  Skin:  Skin is warm, dry and intact. No rash noted. Psychiatric: Mood and affect are normal. Speech and behavior are normal.  ____________________________________________   LABS (all labs ordered are listed, but only abnormal results are displayed)  Labs Reviewed  COMPREHENSIVE METABOLIC PANEL - Abnormal; Notable for the following components:      Result Value   Glucose, Bld 117 (*)    All other components within normal limits  D-DIMER, QUANTITATIVE - Abnormal; Notable for the following components:   D-Dimer, Quant  0.59 (*)    All other components within normal limits  BRAIN NATRIURETIC PEPTIDE  TROPONIN I (HIGH SENSITIVITY)   ____________________________________________   ____________________________________________  RADIOLOGY  Chest x-ray  ____________________________________________   PROCEDURES  Procedure(s) performed: EKG  Procedures    ____________________________________________   INITIAL IMPRESSION / ASSESSMENT AND PLAN / ED COURSE  Pertinent labs & imaging results that were available during my care of the patient were reviewed by me and considered in my medical decision making (see chart for details).   Patient is a 50 year old female presents with chest pain.  See HPI.  Physical exam shows patient per stable.  DDx: Acute bronchitis, anxiety,  MI, lung CA, PE   EKG  metabolic panel, troponin, BNP Chest x-ray  Comprehensive metabolic panel, BNP, troponin, and are normal, D-dimer is elevated  EKG see physician read, normal sinus rhythm  Chest x-ray reviewed by me confirmed by radiology to be normal  CTA for PE is negative  I did discuss findings with patient.  They are to follow-up with your regular doctor if not improving to 3 days.  Return emergency department worsening.  She was discharged stable condition.   Tammy Sosa was evaluated in Emergency Department on 10/08/2020 for the symptoms described in the history of present illness. She was evaluated in the context of the global COVID-19 pandemic, which necessitated consideration that the patient might be at risk for infection with the SARS-CoV-2 virus that causes COVID-19. Institutional protocols and algorithms that pertain to the evaluation of patients at risk for COVID-19 are in a state of rapid change based on information released by regulatory bodies including the CDC and federal and state organizations. These policies and algorithms were followed during the patient's care in the ED.    As part of my  medical decision making, I reviewed the following data within the electronic MEDICAL RECORD NUMBER Nursing notes reviewed and incorporated, Labs reviewed , EKG interpreted NSR, Old chart reviewed, Radiograph reviewed , Notes from prior ED visits and [place] Controlled Substance Database  ____________________________________________   FINAL CLINICAL IMPRESSION(S) / ED DIAGNOSES  Final diagnoses:  Nonspecific chest pain  Epistaxis      NEW MEDICATIONS STARTED DURING THIS VISIT:  New Prescriptions   No medications on file     Note:  This document was prepared using Dragon voice recognition software and may include unintentional dictation errors.    Faythe Ghee, PA-C 10/08/20 1717    Sharman Cheek, MD 10/08/20 2136

## 2020-10-08 NOTE — Discharge Instructions (Signed)
Follow-up with your regular doctor if not improving in 2 to 3 days.  Return emergency department worsening.  Take all of your regular medications.  If you have another nosebleed please apply pressure at your nose for 20 minutes.

## 2020-10-08 NOTE — ED Triage Notes (Signed)
Pt comes  Into the ED via POV c/o coughing up a blood clot.  Pt explains that she had a nose bleed first and blood that was running into her mouth, and then a little later she coughed up a blood clot.  Pt also states she has a headache.  Pt has even and unlabored respirations and is neurologically intact.

## 2020-10-09 ENCOUNTER — Telehealth: Payer: Self-pay | Admitting: *Deleted

## 2020-10-09 NOTE — Telephone Encounter (Signed)
Copied from CRM [id number]. Topic: General - Other >> Oct 09, 2020  1:54 PM Wyonia Hough E wrote: Reason for CRM: Pt went to ER due to having nose bleeds and she now needs a letter for her personal trainer to state that she is not able to participate in personal training at this moment due to nose bleeds and blood clot/ please advise asap

## 2020-10-09 NOTE — Telephone Encounter (Signed)
Please advise 

## 2020-10-12 NOTE — Telephone Encounter (Signed)
Note provided through my chart.

## 2020-10-12 NOTE — Telephone Encounter (Signed)
Pt called back for the status regarding for the letter she needs for her trainer .  CB#  [phone]

## 2020-10-28 ENCOUNTER — Ambulatory Visit: Payer: Self-pay | Admitting: *Deleted
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
## 2020-12-17 DIAGNOSIS — L738 Other specified follicular disorders: Secondary | ICD-10-CM | POA: Diagnosis not present

## 2020-12-17 DIAGNOSIS — B353 Tinea pedis: Secondary | ICD-10-CM | POA: Diagnosis not present

## 2020-12-17 DIAGNOSIS — D2262 Melanocytic nevi of left upper limb, including shoulder: Secondary | ICD-10-CM | POA: Diagnosis not present

## 2020-12-17 DIAGNOSIS — D225 Melanocytic nevi of trunk: Secondary | ICD-10-CM | POA: Diagnosis not present

## 2021-03-29 ENCOUNTER — Telehealth: Payer: Self-pay

## 2021-03-29 ENCOUNTER — Encounter: Payer: Self-pay | Admitting: Family Medicine

## 2021-03-29 ENCOUNTER — Other Ambulatory Visit: Payer: Self-pay

## 2021-03-29 ENCOUNTER — Ambulatory Visit (INDEPENDENT_AMBULATORY_CARE_PROVIDER_SITE_OTHER): Payer: BC Managed Care – PPO | Admitting: Family Medicine

## 2021-03-29 VITALS — BP 102/58 | HR 72 | Ht 64.0 in | Wt 201.0 lb

## 2021-03-29 DIAGNOSIS — Z8673 Personal history of transient ischemic attack (TIA), and cerebral infarction without residual deficits: Secondary | ICD-10-CM

## 2021-03-29 DIAGNOSIS — Q211 Atrial septal defect: Secondary | ICD-10-CM

## 2021-03-29 DIAGNOSIS — F32A Depression, unspecified: Secondary | ICD-10-CM

## 2021-03-29 DIAGNOSIS — Z803 Family history of malignant neoplasm of breast: Secondary | ICD-10-CM

## 2021-03-29 DIAGNOSIS — Z309 Encounter for contraceptive management, unspecified: Secondary | ICD-10-CM | POA: Diagnosis not present

## 2021-03-29 DIAGNOSIS — Q2112 Patent foramen ovale: Secondary | ICD-10-CM

## 2021-03-29 DIAGNOSIS — F419 Anxiety disorder, unspecified: Secondary | ICD-10-CM | POA: Diagnosis not present

## 2021-03-29 MED ORDER — MELOXICAM 15 MG PO TABS: 15.0000 mg | ORAL_TABLET | Freq: Every day | ORAL | 1 refills | Status: DC | PRN

## 2021-03-29 MED ORDER — SERTRALINE HCL 100 MG PO TABS: ORAL_TABLET | ORAL | 3 refills | Status: DC

## 2021-03-29 NOTE — Progress Notes (Signed)
Established patient visit   Patient: Tammy Sosa   DOB: March 27, 1971   50 y.o. Female  MRN: [id number] Visit Date: 03/29/2021  Today's healthcare provider: Mila Merry, MD   Chief Complaint  Patient presents with   CVA history   Anxiety    Subjective  -------------------------------------------------------------------------------------------------------------------- HPI  Anxiety, Follow-up  She was last seen for anxiety 10 months ago (seen by Osvaldo Angst, PA-C). Changes made at last visit include none; patient advised to continue Sertraline 100mg  daily.   She reports poor compliance with treatment.  Pt ran out of medications a few months ago and would like refills.  She reports excellent tolerance of treatment. She is not having side effects.   She feels her anxiety is severe and Worse since last visit. Since being off Zoloft and her mom recently past away. She feels that she was doing much better before stopping medication.   Symptoms: No chest pain Yes difficulty concentrating  No dizziness Yes fatigue  No feelings of losing control Yes insomnia  No irritable No palpitations  No panic attacks Yes racing thoughts  No shortness of breath No sweating  No tremors/shakes    GAD-7 Results GAD-7 Generalized Anxiety Disorder Screening Tool 03/29/2021 05/28/2020  1. Feeling Nervous, Anxious, or on Edge 2 2  2. Not Being Able to Stop or Control Worrying 2 2  3. Worrying Too Much About Different Things 2 2  4. Trouble Relaxing 2 0  5. Being So Restless it's Hard To Sit Still 2 1  6. Becoming Easily Annoyed or Irritable 3 3  7. Feeling Afraid As If Something Awful Might Happen 2 1  Total GAD-7 Score 15 11  Difficulty At Work, Home, or Getting  Along With Others? Very difficult Somewhat difficult    PHQ-9 Scores PHQ9 SCORE ONLY 03/29/2021 05/28/2020 05/24/2019  PHQ-9 Total Score 17 15 17      ---------------------------------------------------------------------------------------------------  Lipid/Cholesterol, Follow-up  Last lipid panel Other pertinent labs  Lab Results  Component Value Date   CHOL 191 05/28/2020   HDL 50 05/28/2020   LDLCALC 129 (H) 05/28/2020   TRIG 65 05/28/2020   CHOLHDL 3.8 05/28/2020   Lab Results  Component Value Date   ALT 20 10/08/2020   AST 30 10/08/2020   PLT 265 05/28/2020   TSH 1.740 05/28/2020     She was last seen for this 1  year  ago.  Management since that visit includes advising patient to restart cholesterol medication.  She was initially prescribed atorvastatin when she was hospitalized for CVA which was subsequently attributed to PFO. Is still on ECASA. She does have some CAD history in elderly family members.   The 10-year ASCVD risk score 05/30/2020 DC 05/30/2020., et al., 2013) is: 0.7%  ---------------------------------------------------------------------------------------------------     Medications: Outpatient Medications Prior to Visit  Medication Sig   aspirin EC 81 MG tablet Take 81 mg by mouth.   levonorgestrel (MIRENA) 20 MCG/24HR IUD 1 each by Intrauterine route once.   atorvastatin (LIPITOR) 10 MG tablet Take 1 tablet (10 mg total) by mouth daily. (Patient not taking: Reported on 03/29/2021)   meloxicam (MOBIC) 15 MG tablet Take 15 mg by mouth daily. (Patient not taking: Reported on 03/29/2021)   sertraline (ZOLOFT) 100 MG tablet Take 1 tablet (100 mg total) by mouth daily. (Patient not taking: Reported on 03/29/2021)   No facility-administered medications prior to visit.    Review of Systems  Constitutional:  Positive for fatigue. Negative for  activity change, appetite change, chills, diaphoresis, fever and unexpected weight change.  Respiratory: Negative.    Cardiovascular:  Positive for leg swelling. Negative for chest pain and palpitations.  Gastrointestinal:  Positive for abdominal distention, abdominal pain and  diarrhea. Negative for anal bleeding, blood in stool, constipation, nausea, rectal pain and vomiting.       Reflux   Musculoskeletal:  Positive for back pain (Lower back pain).  Neurological:  Negative for dizziness, light-headedness and headaches.  Psychiatric/Behavioral:  Positive for decreased concentration, dysphoric mood, self-injury, sleep disturbance and suicidal ideas. The patient is nervous/anxious.       Objective  -------------------------------------------------------------------------------------------------------------------- BP (!) 102/58 (BP Location: Left Arm, Patient Position: Sitting, Cuff Size: Large)   Pulse 72   Ht 5\' 4"  (1.626 m)   Wt 201 lb (91.2 kg)   SpO2 99%   BMI 34.50 kg/m     Physical Exam   General: Appearance:    Mildly obese female in no acute distress  Eyes:    PERRL, conjunctiva/corneas clear, EOM's intact       Lungs:     Clear to auscultation bilaterally, respirations unlabored  Heart:    Normal heart rate. Normal rhythm. No murmurs, rubs, or gallops.    MS:   All extremities are intact.    Neurologic:   Awake, alert, oriented x 3. No apparent focal neurological defect.         Assessment & Plan  ---------------------------------------------------------------------------------------------------------------------- 1. Anxiety and depression Was doing well before running out of  sertraline (ZOLOFT) 100 MG tablet; Take 1/2 tablet daily for eight days, then one tablet daily  Dispense: 90 tablet; Refill: 3  2. History of CVA (cerebrovascular accident) Likely secondary to PFO rather than vascular disease.  - Lipid panel - Comprehensive metabolic panel  Currently off statin.   3. PFO (patent foramen ovale) On ECASA   4. Encounter for contraceptive management, unspecified type Her IUD is over 79 years old.  - Ambulatory referral to Obstetrics / Gynecology  5. Family history of breast cancer Provided contact information to schedule mammogram  at [facility].   Refill - meloxicam (MOBIC) 15 MG tablet; Take 1 tablet (15 mg total) by mouth daily as needed for pain.  Dispense: 90 tablet; Refill: 1         The entirety of the information documented in the History of Present Illness, Review of Systems and Physical Exam were personally obtained by me. Portions of this information were initially documented by the CMA and reviewed by me for thoroughness and accuracy.     [facility], MD  [facility] [phone] (phone) [phone] (fax)  [facility]

## 2021-03-29 NOTE — Patient Instructions (Signed)
Please review the attached list of medications and notify my office if there are any errors.  ? ?Please call the [facility] ([phone]) to schedule a routine screening mammogram. ? ?

## 2021-03-29 NOTE — Telephone Encounter (Signed)
[facility] referring for Encounter for contraceptive management, unspecified type. Called and left voicemail for patient to call back to be scheduled.

## 2021-03-30 NOTE — Telephone Encounter (Signed)
Called and left voicemail for patient to call back to be scheduled. 

## 2021-03-31 NOTE — Telephone Encounter (Signed)
Patient is scheduled for Mirena replacement on 04/08/21 at 9:30 with ABC

## 2021-04-01 NOTE — Telephone Encounter (Signed)
Noted. Will order to arrive by apt date/time. 

## 2021-04-07 ENCOUNTER — Encounter: Payer: Self-pay | Admitting: Obstetrics and Gynecology

## 2021-04-08 ENCOUNTER — Other Ambulatory Visit: Payer: Self-pay

## 2021-04-08 ENCOUNTER — Encounter: Payer: BC Managed Care – PPO | Admitting: Obstetrics and Gynecology

## 2021-04-08 DIAGNOSIS — Z8673 Personal history of transient ischemic attack (TIA), and cerebral infarction without residual deficits: Secondary | ICD-10-CM | POA: Diagnosis not present

## 2021-04-08 NOTE — Telephone Encounter (Signed)
Called pt to let her know Mirena IUD is good for 7 years. She is good until 2024 since her IUD insertion was 11/25/15, unless she is having issues? No answer, LVMTRC.

## 2021-04-09 LAB — COMPREHENSIVE METABOLIC PANEL
ALT: 15 IU/L (ref 0–32)
AST: 15 IU/L (ref 0–40)
Albumin/Globulin Ratio: 1.8 (ref 1.2–2.2)
Albumin: 4.2 g/dL (ref 3.8–4.8)
Alkaline Phosphatase: 57 IU/L (ref 44–121)
BUN/Creatinine Ratio: 17 (ref 9–23)
BUN: 15 mg/dL (ref 6–24)
Bilirubin Total: <0.2 mg/dL (ref 0.0–1.2)
CO2: 23 mmol/L (ref 20–29)
Calcium: 9.4 mg/dL (ref 8.7–10.2)
Chloride: 109 mmol/L — ABNORMAL HIGH (ref 96–106)
Creatinine, Ser: 0.87 mg/dL (ref 0.57–1.00)
Globulin, Total: 2.4 g/dL (ref 1.5–4.5)
Glucose: 98 mg/dL (ref 65–99)
Potassium: 4.2 mmol/L (ref 3.5–5.2)
Sodium: 144 mmol/L (ref 134–144)
Total Protein: 6.6 g/dL (ref 6.0–8.5)
eGFR: 83 mL/min/{1.73_m2} (ref >59)

## 2021-04-09 LAB — LIPID PANEL
Chol/HDL Ratio: 3.8 ratio (ref 0.0–4.4)
Cholesterol, Total: 177 mg/dL (ref 100–199)
HDL: 46 mg/dL (ref >39)
LDL Chol Calc (NIH): 119 mg/dL — ABNORMAL HIGH (ref 0–99)
Triglycerides: 62 mg/dL (ref 0–149)
VLDL Cholesterol Cal: 12 mg/dL (ref 5–40)

## 2021-04-14 ENCOUNTER — Telehealth: Payer: Self-pay

## 2021-04-14 NOTE — Telephone Encounter (Signed)
Copied from CRM [id number]. Topic: General - Other >> Apr 14, 2021  3:41 PM Traci Sermon wrote: Reason for CRM: Pt called in stating if she could get a note stating she has an appt to go get "cool sulpton"(a machine that sucks the skin)?.the patient state in order for her to cancel that appt she needs a detail note from provider stating she gets blood clots, please advise.

## 2021-05-20 NOTE — Telephone Encounter (Signed)
Patient cancelled apt per Epic. 

## 2021-05-28 ENCOUNTER — Encounter: Payer: Self-pay | Admitting: Physician Assistant

## 2021-07-05 DIAGNOSIS — Z713 Dietary counseling and surveillance: Secondary | ICD-10-CM | POA: Diagnosis not present

## 2021-07-05 DIAGNOSIS — Z6836 Body mass index (BMI) 36.0-36.9, adult: Secondary | ICD-10-CM | POA: Diagnosis not present

## 2021-07-05 DIAGNOSIS — Z833 Family history of diabetes mellitus: Secondary | ICD-10-CM | POA: Diagnosis not present

## 2021-07-12 DIAGNOSIS — Z833 Family history of diabetes mellitus: Secondary | ICD-10-CM | POA: Diagnosis not present

## 2021-07-12 DIAGNOSIS — Z6835 Body mass index (BMI) 35.0-35.9, adult: Secondary | ICD-10-CM | POA: Diagnosis not present

## 2021-07-12 DIAGNOSIS — Z713 Dietary counseling and surveillance: Secondary | ICD-10-CM | POA: Diagnosis not present

## 2021-07-19 DIAGNOSIS — F331 Major depressive disorder, recurrent, moderate: Secondary | ICD-10-CM | POA: Diagnosis not present

## 2021-07-19 DIAGNOSIS — Z6835 Body mass index (BMI) 35.0-35.9, adult: Secondary | ICD-10-CM | POA: Diagnosis not present

## 2021-07-19 DIAGNOSIS — Z713 Dietary counseling and surveillance: Secondary | ICD-10-CM | POA: Diagnosis not present

## 2021-08-05 DIAGNOSIS — M150 Primary generalized (osteo)arthritis: Secondary | ICD-10-CM | POA: Diagnosis not present

## 2021-08-05 DIAGNOSIS — Z6835 Body mass index (BMI) 35.0-35.9, adult: Secondary | ICD-10-CM | POA: Diagnosis not present

## 2021-08-05 DIAGNOSIS — Z713 Dietary counseling and surveillance: Secondary | ICD-10-CM | POA: Diagnosis not present

## 2021-08-12 DIAGNOSIS — Z713 Dietary counseling and surveillance: Secondary | ICD-10-CM | POA: Diagnosis not present

## 2021-08-12 DIAGNOSIS — Z6835 Body mass index (BMI) 35.0-35.9, adult: Secondary | ICD-10-CM | POA: Diagnosis not present

## 2021-08-12 DIAGNOSIS — E6609 Other obesity due to excess calories: Secondary | ICD-10-CM | POA: Diagnosis not present

## 2021-08-12 DIAGNOSIS — Z833 Family history of diabetes mellitus: Secondary | ICD-10-CM | POA: Diagnosis not present

## 2021-08-19 DIAGNOSIS — Z713 Dietary counseling and surveillance: Secondary | ICD-10-CM | POA: Diagnosis not present

## 2021-08-19 DIAGNOSIS — Z833 Family history of diabetes mellitus: Secondary | ICD-10-CM | POA: Diagnosis not present

## 2021-08-19 DIAGNOSIS — E6609 Other obesity due to excess calories: Secondary | ICD-10-CM | POA: Diagnosis not present

## 2021-08-19 DIAGNOSIS — Z6835 Body mass index (BMI) 35.0-35.9, adult: Secondary | ICD-10-CM | POA: Diagnosis not present

## 2021-08-30 DIAGNOSIS — F331 Major depressive disorder, recurrent, moderate: Secondary | ICD-10-CM | POA: Diagnosis not present

## 2021-08-30 DIAGNOSIS — Z6835 Body mass index (BMI) 35.0-35.9, adult: Secondary | ICD-10-CM | POA: Diagnosis not present

## 2021-08-30 DIAGNOSIS — Z713 Dietary counseling and surveillance: Secondary | ICD-10-CM | POA: Diagnosis not present

## 2021-08-30 DIAGNOSIS — E6609 Other obesity due to excess calories: Secondary | ICD-10-CM | POA: Diagnosis not present

## 2021-09-01 ENCOUNTER — Ambulatory Visit: Payer: Self-pay | Admitting: *Deleted

## 2021-09-01 ENCOUNTER — Telehealth: Payer: Self-pay

## 2021-09-01 NOTE — Telephone Encounter (Signed)
° ° °  Chief Complaint: Productive cough with green mucus Symptoms: Runny nose, SOB with exertion Frequency: Started 3 weeks ago Pertinent Negatives: Patient denies fever Disposition: [] ED /[] Urgent Care (no appt availability in office) / [x] Appointment(In office/virtual)/ []  [place] [facility]/ [] Home Care/ [] Refused Recommended Disposition /[] [facility] [facility]/ []  Follow-up with PCP Additional Notes:    Reason for Disposition  [1] Continuous (nonstop) coughing interferes with work or school AND [2] no improvement using cough treatment per Care Advice  Answer Assessment - Initial Assessment Questions 1. ONSET: "When did the cough begin?"      3 weeks 2. SEVERITY: "How bad is the cough today?"      Severe 3. SPUTUM: "Describe the color of your sputum" (none, dry cough; clear, white, yellow, green)     Green 4. HEMOPTYSIS: "Are you coughing up any blood?" If so ask: "How much?" (flecks, streaks, tablespoons, etc.)     No 5. DIFFICULTY BREATHING: "Are you having difficulty breathing?" If Yes, ask: "How bad is it?" (e.g., mild, moderate, severe)    - MILD: No SOB at rest, mild SOB with walking, speaks normally in sentences, can lie down, no retractions, pulse < 100.    - MODERATE: SOB at rest, SOB with minimal exertion and prefers to sit, cannot lie down flat, speaks in phrases, mild retractions, audible wheezing, pulse 100-120.    - SEVERE: Very SOB at rest, speaks in single words, struggling to breathe, sitting hunched forward, retractions, pulse > 120      Mild 6. FEVER: "Do you have a fever?" If Yes, ask: "What is your temperature, how was it measured, and when did it start?"     None today 7. CARDIAC HISTORY: "Do you have any history of heart disease?" (e.g., heart attack, congestive heart failure)      No 8. LUNG HISTORY: "Do you have any history of lung disease?"  (e.g., pulmonary embolus, asthma, emphysema)     Asthma  9. PE RISK FACTORS: "Do you have a history of blood  clots?" (or: recent major surgery, recent prolonged travel, bedridden)     No 10. OTHER SYMPTOMS: "Do you have any other symptoms?" (e.g., runny nose, wheezing, chest pain)       Runny nose 11. PREGNANCY: "Is there any chance you are pregnant?" "When was your last menstrual period?"       No 12. TRAVEL: "Have you traveled out of the country in the last month?" (e.g., travel history, exposures)       No  Protocols used: Cough - Acute Productive-A-AH

## 2021-09-01 NOTE — Progress Notes (Signed)
Acute Office Visit  Subjective:    Patient ID: Tammy Sosa, female    DOB: [birth year], 51 y.o.   MRN: [id number]  Cc. Cough and congestion   HPI Patient is in today for a cough with green mucus that has been present for 3 weeks. Cough Patient complains of nasal congestion, productive cough, productive cough with sputum described as yellow and green, rhinorrhea  , sneezing, and sore throat. Symptoms began  1 mo ago   ago. Symptoms have been some are improving but others are worse since that time.The cough is productive and is aggravated by exercise and reclining position. Associated symptoms include: postnasal drip, shortness of breath, and sputum production. Patient does not have new pets. Patient does have a history of asthma. Patient does not have a history of environmental allergens. Patient has not traveled recently. Patient does not have a history of smoking. Patient has not had a previous chest x-ray. Patient has not had a PPD done. Reports testing negative for COVID at the beginning of her symptom progression Reports initially was a sore throat, negative covid test, but then symptoms progressed to fever, body aches, malaise, and sinus infection sxs. This improved, but sinus pain congestion and cough never improved. Hx of asthma as a child, and feels sob while walking upstairs, occasionally to the point of dizziness/needs to stop to breathe. Chest pain center of chest w/ deep breath or coughing   Been using sudafed and mucinex otc  Reports persistent sxs of gerd as well -- reflux, regurgitation, burning pain. Was eval in 2019, dx w/ hyplori and treated, but didn't follow up. Reports symptoms never improved, but does not take meds for acid reduction.    Past Medical History:  Diagnosis Date   Abnormal Papanicolaou smear of cervix with positive human papilloma virus (HPV) test 12/11/2014   LSIL/Mild dysplasia   Arthritis    hips   Asthma    as young adult   Atypical chest  pain    a. 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 08/2015 TEE:EF 60%, no rwma, large PFO w/ R->L shunt; b. 05/2019 Echo: EF 60-65%, nl RV size/fxn, nl PASP. Neg bubble study.   Plantar fasciitis    Stroke [facility]) 2017   a. 08/2015 in setting of PFO-->ASA 4m daily.   Wears contact lenses     Past Surgical History:  Procedure Laterality Date   CERVICAL CONIZATION W/BX     COLONOSCOPY WITH PROPOFOL N/A 06/20/2018   Procedure: COLONOSCOPY WITH PROPOFOL;  Surgeon: VLin Landsman MD;  Location: [facility]  Service: Endoscopy;  Laterality: N/A;   ESOPHAGOGASTRODUODENOSCOPY (EGD) WITH PROPOFOL N/A 06/20/2018   Procedure: ESOPHAGOGASTRODUODENOSCOPY (EGD) WITH PROPOFOL;  Surgeon: VLin Landsman MD;  [facility]  Service: Endoscopy;  Laterality: N/A;   INTRAUTERINE DEVICE (IUD) INSERTION  11/25/2015   Mirena   KNEE  SURGERY Right     Family History  Problem Relation Age of Onset   Breast cancer Mother 71   Ovarian cancer Mother 46   Diabetes Mother    COPD Mother    Hypertension Mother    Skin cancer Father    Stomach cancer Paternal Uncle    Stomach cancer Maternal Grandfather    Diabetes Paternal Grandmother     Social History   Socioeconomic History   Marital status: Married    Spouse name: Not on file   Number of children: Not on file   Years of education: Not on file   Highest education level: Not on file  Occupational History   Not on file  Tobacco  Use   Smoking status: Never   Smokeless tobacco: Never  Vaping Use   Vaping Use: Never used  Substance and Sexual Activity   Alcohol use: Yes    Alcohol/week: 4.0 standard drinks    Types: 1 Glasses of wine, 1 Cans of beer, 2 Shots of liquor per week   Drug use: No   Sexual activity: Yes    Birth control/protection: I.U.D.  Other Topics Concern   Not on file  Social History Narrative   Not on file   Social Determinants of Health   Financial Resource Strain: Not on file  Food Insecurity: Not on file  Transportation Needs: Not on file  Physical Activity: Not on file  Stress: Not on file  Social Connections: Not on file  Intimate Partner Violence: Not on file    Outpatient Medications Prior to Visit  Medication Sig Dispense Refill   aspirin EC 81 MG tablet Take 81 mg by mouth.     levonorgestrel (MIRENA) 20 MCG/24HR IUD 1 each by Intrauterine route once.     meloxicam (MOBIC) 15 MG tablet Take 1 tablet (15 mg total) by mouth daily as needed for pain. 90 tablet 1   sertraline (ZOLOFT) 100 MG tablet Take 1/2 tablet daily for eight days, then one tablet daily 90 tablet 3   atorvastatin (LIPITOR) 10 MG tablet Take 1 tablet (10 mg total) by mouth daily. (Patient not taking: Reported on 03/29/2021) 90 tablet 3   No facility-administered medications prior to visit.    No Known Allergies  Review of Systems  Constitutional:  Positive for fatigue. Negative for fever.  HENT:  Positive for congestion, hearing loss, postnasal drip, rhinorrhea, sinus pressure, sinus pain, sore throat and voice change.   Respiratory:  Positive for cough and shortness of breath. Negative for wheezing.   Cardiovascular:  Positive for chest pain. Negative for palpitations.  Neurological:  Positive for dizziness and headaches. Negative for light-headedness.      Objective:    Physical Exam Constitutional:      General: She is awake.     Appearance: She is well-developed.  HENT:     Head:  Normocephalic.     Right Ear: Tympanic membrane normal.     Left Ear: Tympanic membrane normal.     Nose: Congestion and rhinorrhea present.     Comments: Frontal and maxillary sinus tenderness     Mouth/Throat:     Mouth: Mucous membranes are moist.     Pharynx: Posterior oropharyngeal erythema present.  Eyes:     Conjunctiva/sclera: Conjunctivae normal.  Cardiovascular:     Rate and Rhythm: Normal rate and regular rhythm.     Heart sounds: Normal heart sounds.  Pulmonary:     Effort: Pulmonary effort is normal.  Breath sounds: Normal breath sounds. No wheezing.  Skin:    General: Skin is warm.  Neurological:     Mental Status: She is alert and oriented to person, place, and time.  Psychiatric:        Attention and Perception: Attention normal.        Mood and Affect: Mood normal.        Speech: Speech normal.        Behavior: Behavior is cooperative.    BP (!) 111/53 (BP Location: Left Arm, Patient Position: Sitting, Cuff Size: Normal)    Pulse 62    Temp 98.4 F (36.9 C) (Oral)    Wt 194 lb (88 kg)    SpO2 99%    BMI 33.30 kg/m  Wt Readings from Last 3 Encounters:  09/02/21 194 lb (88 kg)  03/29/21 201 lb (91.2 kg)  10/08/20 190 lb (86.2 kg)    Health Maintenance Due  Topic Date Due   COVID-19 Vaccine (1) Never done   MAMMOGRAM  02/24/2021    There are no preventive care reminders to display for this patient.   Lab Results  Component Value Date   TSH 1.740 05/28/2020   Lab Results  Component Value Date   WBC 8.4 05/28/2020   HGB 12.8 05/28/2020   HCT 37.8 05/28/2020   MCV 88 05/28/2020   PLT 265 05/28/2020   Lab Results  Component Value Date   NA 144 04/08/2021   K 4.2 04/08/2021   CO2 23 04/08/2021   GLUCOSE 98 04/08/2021   BUN 15 04/08/2021   CREATININE 0.87 04/08/2021   BILITOT <0.2 04/08/2021   ALKPHOS 57 04/08/2021   AST 15 04/08/2021   ALT 15 04/08/2021   PROT 6.6 04/08/2021   ALBUMIN 4.2 04/08/2021   CALCIUM 9.4 04/08/2021    ANIONGAP 8 10/08/2020   EGFR 83 04/08/2021   Lab Results  Component Value Date   CHOL 177 04/08/2021   Lab Results  Component Value Date   HDL 46 04/08/2021   Lab Results  Component Value Date   LDLCALC 119 (H) 04/08/2021   Lab Results  Component Value Date   TRIG 62 04/08/2021   Lab Results  Component Value Date   CHOLHDL 3.8 04/08/2021   Lab Results  Component Value Date   HGBA1C 5.3 08/23/2015       Assessment & Plan:   Acute sinusitis Rx augmentin 10 days advised w/ food and probiotic/yogurt Rx azelastine nasal spray  2. H/ of asthma, airway hyperreactivity Rx albuterol inhaler for episodes of sob w/ exertion   Problem List Items Addressed This Visit       Digestive   Gastroesophageal reflux disease    Advised restart on omeprazole 40 mg first thing in AM on an empty stomach Will monitor for improvement of symptoms, if only limited or mild improvement, will either send to GI or w/u here for persistent hpylori infection      Relevant Medications   omeprazole (PRILOSEC) 40 MG capsule   Other Visit Diagnoses     Acute non-recurrent frontal sinusitis    -  Primary   Relevant Medications   azelastine (ASTELIN) 0.1 % nasal spray   amoxicillin-clavulanate (AUGMENTIN) 875-125 MG tablet   Mild intermittent asthma without complication       Relevant Medications   albuterol (VENTOLIN HFA) 108 (90 Base) MCG/ACT inhaler   History of Helicobacter pylori infection       Relevant Medications   omeprazole (PRILOSEC) 40  MG capsule       F/u 2-3 mo for depression/anxiety and gerd  I,Elena D DeSanto,acting as a scribe for [employer], PA-C.,have documented all relevant documentation on the behalf of Mikey Kirschner, PA-C,as directed by  Mikey Kirschner, PA-C while in the presence of Mikey Kirschner, PA-C.  I, Mikey Kirschner, PA-C have reviewed all documentation for this visit. The documentation on  09/02/2021  for the exam, diagnosis, procedures, and orders are all  accurate and complete.   Mikey Kirschner, PA-C [facility] [address] [place], [place], [address] Office: [phone] Fax: [phone]

## 2021-09-01 NOTE — Telephone Encounter (Signed)
Reviewed in chart patient is scheduled to be seen by Lillia Abed on 09/02/21 for symptoms. KW

## 2021-09-01 NOTE — Telephone Encounter (Signed)
I returned pt's call.  She called in earlier c/o having chest congestion and green mucus for 1 month.  Left a voicemail to call back however a message had been sent to the office and waiting on a response from them regarding an appt. So someone will be calling her back.

## 2021-09-01 NOTE — Telephone Encounter (Signed)
Second attempt to reach patient for triage- left message to call back

## 2021-09-01 NOTE — Telephone Encounter (Signed)
Third attempt to contact patient- no answer- left message to call office 

## 2021-09-01 NOTE — Telephone Encounter (Signed)
Copied from CRM [id number]. Topic: Appointment Scheduling - Scheduling Inquiry for Clinic >> Sep 01, 2021 11:12 AM Fanny Bien wrote: Reason for CRM: Pt called and stated that she has been congestion for over a month. Pt would like to know if she can be worked in. Please advise

## 2021-09-02 ENCOUNTER — Other Ambulatory Visit: Payer: Self-pay | Admitting: Physician Assistant

## 2021-09-02 ENCOUNTER — Ambulatory Visit (INDEPENDENT_AMBULATORY_CARE_PROVIDER_SITE_OTHER): Payer: BC Managed Care – PPO | Admitting: Physician Assistant

## 2021-09-02 ENCOUNTER — Other Ambulatory Visit: Payer: Self-pay

## 2021-09-02 VITALS — BP 111/53 | HR 62 | Temp 98.4°F | Wt 194.0 lb

## 2021-09-02 DIAGNOSIS — J4520 Mild intermittent asthma, uncomplicated: Secondary | ICD-10-CM

## 2021-09-02 DIAGNOSIS — J0110 Acute frontal sinusitis, unspecified: Secondary | ICD-10-CM

## 2021-09-02 DIAGNOSIS — Z8619 Personal history of other infectious and parasitic diseases: Secondary | ICD-10-CM

## 2021-09-02 DIAGNOSIS — K219 Gastro-esophageal reflux disease without esophagitis: Secondary | ICD-10-CM

## 2021-09-02 MED ORDER — AMOXICILLIN-POT CLAVULANATE 875-125 MG PO TABS: 1.0000 | ORAL_TABLET | Freq: Two times a day (BID) | ORAL | 0 refills | Status: DC

## 2021-09-02 MED ORDER — AZELASTINE HCL 0.1 % NA SOLN: 2.0000 | Freq: Two times a day (BID) | NASAL | 1 refills | Status: DC

## 2021-09-02 MED ORDER — ALBUTEROL SULFATE HFA 108 (90 BASE) MCG/ACT IN AERS: 2.0000 | INHALATION_SPRAY | Freq: Four times a day (QID) | RESPIRATORY_TRACT | 2 refills | Status: AC | PRN

## 2021-09-02 MED ORDER — ESOMEPRAZOLE MAGNESIUM 40 MG PO CPDR: 40.0000 mg | DELAYED_RELEASE_CAPSULE | Freq: Every day | ORAL | 2 refills | Status: DC

## 2021-09-02 MED ORDER — OMEPRAZOLE 40 MG PO CPDR: 40.0000 mg | DELAYED_RELEASE_CAPSULE | Freq: Every day | ORAL | 3 refills | Status: DC

## 2021-09-02 NOTE — Progress Notes (Signed)
Omeprazole not covered.

## 2021-09-02 NOTE — Assessment & Plan Note (Signed)
Advised restart on omeprazole 40 mg first thing in AM on an empty stomach Will monitor for improvement of symptoms, if only limited or mild improvement, will either send to GI or w/u here for persistent hpylori infection

## 2021-09-06 DIAGNOSIS — F331 Major depressive disorder, recurrent, moderate: Secondary | ICD-10-CM | POA: Diagnosis not present

## 2021-09-06 DIAGNOSIS — Z6835 Body mass index (BMI) 35.0-35.9, adult: Secondary | ICD-10-CM | POA: Diagnosis not present

## 2021-09-06 DIAGNOSIS — E6609 Other obesity due to excess calories: Secondary | ICD-10-CM | POA: Diagnosis not present

## 2021-09-06 DIAGNOSIS — Z713 Dietary counseling and surveillance: Secondary | ICD-10-CM | POA: Diagnosis not present

## 2021-09-20 DIAGNOSIS — Z6835 Body mass index (BMI) 35.0-35.9, adult: Secondary | ICD-10-CM | POA: Diagnosis not present

## 2021-09-20 DIAGNOSIS — D509 Iron deficiency anemia, unspecified: Secondary | ICD-10-CM | POA: Diagnosis not present

## 2021-09-20 DIAGNOSIS — Z713 Dietary counseling and surveillance: Secondary | ICD-10-CM | POA: Diagnosis not present

## 2021-09-20 DIAGNOSIS — E6609 Other obesity due to excess calories: Secondary | ICD-10-CM | POA: Diagnosis not present

## 2021-09-24 ENCOUNTER — Other Ambulatory Visit: Payer: Self-pay | Admitting: Physician Assistant

## 2021-09-24 DIAGNOSIS — J0110 Acute frontal sinusitis, unspecified: Secondary | ICD-10-CM

## 2021-09-24 NOTE — Telephone Encounter (Signed)
Requested medications are due for refill today.  unsure  Requested medications are on the active medications list.  yes  Last refill. 09/02/2021 30 with 1 refill  Future visit scheduled.   yes  Notes to clinic.  Unsure how long this rx should last - just filled 09/02/2021. Also unsure if medication is to be continued. Please review.    Requested Prescriptions  Pending Prescriptions Disp Refills   Azelastine HCl 137 MCG/SPRAY SOLN [Pharmacy Med Name: AZELASTINE 0.1% (137 MCG) SPRY]  1    Sig: PLACE 2 SPRAYS INTO BOTH NOSTRILS 2 (TWO) TIMES DAILY. USE IN EACH NOSTRIL AS DIRECTED     Ear, Nose, and Throat: Nasal Preparations - Antiallergy Passed - 09/24/2021  1:31 PM      Passed - Valid encounter within last 12 months    Recent Outpatient Visits           3 weeks ago Acute non-recurrent frontal sinusitis   [facility] Thedore Mins, [place], PA-C   5 months ago Anxiety and depression   [facility] Birdie Sons, MD   1 year ago Annual physical exam   [facility] Trinna Post, [place]   1 year ago Breast lump   [facility] Trinna Post, [place]   2 years ago Annual physical exam   [facility] Trinna Post, [place]       Future Appointments             In 2 months Gwyneth Sprout, [place], [place]

## 2021-09-27 DIAGNOSIS — F331 Major depressive disorder, recurrent, moderate: Secondary | ICD-10-CM | POA: Diagnosis not present

## 2021-09-27 DIAGNOSIS — Z713 Dietary counseling and surveillance: Secondary | ICD-10-CM | POA: Diagnosis not present

## 2021-09-27 DIAGNOSIS — E6609 Other obesity due to excess calories: Secondary | ICD-10-CM | POA: Diagnosis not present

## 2021-09-27 DIAGNOSIS — D509 Iron deficiency anemia, unspecified: Secondary | ICD-10-CM | POA: Diagnosis not present

## 2021-09-29 ENCOUNTER — Other Ambulatory Visit: Payer: Self-pay | Admitting: Family Medicine

## 2021-10-11 ENCOUNTER — Other Ambulatory Visit: Payer: Self-pay | Admitting: Family Medicine

## 2021-10-11 DIAGNOSIS — E6609 Other obesity due to excess calories: Secondary | ICD-10-CM | POA: Diagnosis not present

## 2021-10-11 DIAGNOSIS — Z833 Family history of diabetes mellitus: Secondary | ICD-10-CM | POA: Diagnosis not present

## 2021-10-11 DIAGNOSIS — Z6835 Body mass index (BMI) 35.0-35.9, adult: Secondary | ICD-10-CM | POA: Diagnosis not present

## 2021-10-11 DIAGNOSIS — J0110 Acute frontal sinusitis, unspecified: Secondary | ICD-10-CM

## 2021-10-11 DIAGNOSIS — Z713 Dietary counseling and surveillance: Secondary | ICD-10-CM | POA: Diagnosis not present

## 2021-10-11 NOTE — Telephone Encounter (Signed)
Requested Prescriptions  ?Pending Prescriptions Disp Refills  ?? Azelastine HCl 137 MCG/SPRAY SOLN [Pharmacy Med Name: AZELASTINE 0.1% (137 MCG) SPRY] 90 mL   ?  Sig: PLACE 2 SPRAYS INTO BOTH NOSTRILS 2 (TWO) TIMES DAILY. USE IN EACH NOSTRIL AS DIRECTED  ?  ? Ear, Nose, and Throat: Nasal Preparations - Antiallergy Passed - 10/11/2021  8:37 AM  ?  ?  Passed - Valid encounter within last 12 months  ?  Recent Outpatient Visits   ?      ? 1 month ago Acute non-recurrent frontal sinusitis  ? [facility] Ok Edwards, Lillia Abed, PA-C  ? 6 months ago Anxiety and depression  ? [facility] Sherrie Mustache, Demetrios Isaacs, [place]  ? 1 year ago Annual physical exam  ? [facility] Osvaldo Angst M, [place]  ? 1 year ago Breast lump  ? [facility], Lavella Hammock, [place]  ? 2 years ago Annual physical exam  ? [facility] Osvaldo Angst M, [place]  ?  ?  ?Future Appointments   ?        ? In 1 month [name], FNP [facility], [facility]  ?  ? ?  ?  ?  ? ? ?

## 2021-10-18 DIAGNOSIS — Z713 Dietary counseling and surveillance: Secondary | ICD-10-CM | POA: Diagnosis not present

## 2021-10-18 DIAGNOSIS — Z833 Family history of diabetes mellitus: Secondary | ICD-10-CM | POA: Diagnosis not present

## 2021-10-18 DIAGNOSIS — Z8673 Personal history of transient ischemic attack (TIA), and cerebral infarction without residual deficits: Secondary | ICD-10-CM | POA: Diagnosis not present

## 2021-10-18 DIAGNOSIS — E6609 Other obesity due to excess calories: Secondary | ICD-10-CM | POA: Diagnosis not present

## 2021-10-25 DIAGNOSIS — Z6834 Body mass index (BMI) 34.0-34.9, adult: Secondary | ICD-10-CM | POA: Diagnosis not present

## 2021-10-25 DIAGNOSIS — Z713 Dietary counseling and surveillance: Secondary | ICD-10-CM | POA: Diagnosis not present

## 2021-10-25 DIAGNOSIS — D509 Iron deficiency anemia, unspecified: Secondary | ICD-10-CM | POA: Diagnosis not present

## 2021-10-25 DIAGNOSIS — E6609 Other obesity due to excess calories: Secondary | ICD-10-CM | POA: Diagnosis not present

## 2021-10-27 ENCOUNTER — Ambulatory Visit: Payer: Self-pay | Admitting: *Deleted

## 2021-10-27 NOTE — Telephone Encounter (Signed)
Pt called states has had diarrhea, for 2 days and is asking for assistance with with. Please call back.   ?  ? ?Attempted tor each pt, left VM to call back to discuss symptoms. ?

## 2021-10-27 NOTE — Telephone Encounter (Signed)
Patient called, left VM to return the call to the office to discuss symptoms with a nurse. ? ? ? ?Summary: diarrhea  ? Pt called states has had diarrhea, for 2 days and is asking for assistance with with. Please call back.   ?  ? ?

## 2021-10-27 NOTE — Telephone Encounter (Signed)
?  Chief Complaint: diarrhea ?Symptoms: over 1 week- worse last 2 days ?Frequency: moderate ?Pertinent Negatives: Patient denies fever ?Disposition: [] ED /[] Urgent Care (no appt availability in office) / [] Appointment(In office/virtual)/ []  [facility]/ [] Home Care/ [] Refused Recommended Disposition /[] [facility]/ []  Follow-up with PCP ?Additional Notes: First available appointment is Friday- patient advised would request provider review for sooner appointment- if none available- UC may be advised   ?

## 2021-10-27 NOTE — Telephone Encounter (Signed)
Reason for Disposition ?? [1] MODERATE diarrhea (e.g., 4-6 times / day more than normal) AND [2] present > 48 hours (2 days) ? ?Answer Assessment - Initial Assessment Questions ?1. DIARRHEA SEVERITY: "How bad is the diarrhea?" "How many more stools have you had in the past 24 hours than normal?"  ?  - NO DIARRHEA (SCALE 0) ?  - MILD (SCALE 1-3): Few loose or mushy BMs; increase of 1-3 stools over normal daily number of stools; mild increase in ostomy output. ?  -  MODERATE (SCALE 4-7): Increase of 4-6 stools daily over normal; moderate increase in ostomy output. ?* SEVERE (SCALE 8-10; OR 'WORST POSSIBLE'): Increase of 7 or more stools daily over normal; moderate increase in ostomy output; incontinence. ?    moderate ?2. ONSET: "When did the diarrhea begin?"  ?    1 week- got worse for 2 days ?3. BM CONSISTENCY: "How loose or watery is the diarrhea?"  ?    both ?4. VOMITING: "Are you also vomiting?" If Yes, ask: "How many times in the past 24 hours?"  ?    no ?5. ABDOMINAL PAIN: "Are you having any abdominal pain?" If Yes, ask: "What does it feel like?" (e.g., crampy, dull, intermittent, constant)  ?    Yes- burning ?6. ABDOMINAL PAIN SEVERITY: If present, ask: "How bad is the pain?"  (e.g., Scale 1-10; mild, moderate, or severe) ?  - MILD (1-3): doesn't interfere with normal activities, abdomen soft and not tender to touch  ?  - MODERATE (4-7): interferes with normal activities or awakens from sleep, abdomen tender to touch  ?  - SEVERE (8-10): excruciating pain, doubled over, unable to do any normal activities   ?    Moderate- Pepcid helped some ?7. ORAL INTAKE: If vomiting, "Have you been able to drink liquids?" "How much liquids have you had in the past 24 hours?" ?    no ?8. HYDRATION: "Any signs of dehydration?" (e.g., dry mouth [not just dry lips], too weak to stand, dizziness, new weight loss) "When did you last urinate?" ?    weakness ?9. EXPOSURE: "Have you traveled to a foreign country recently?" "Have you  been exposed to anyone with diarrhea?" "Could you have eaten any food that was spoiled?" ?    H pylori 1 year ago ?10. ANTIBIOTIC USE: "Are you taking antibiotics now or have you taken antibiotics in the past 2 months?" ?      No antibiotic ?11. OTHER SYMPTOMS: "Do you have any other symptoms?" (e.g., fever, blood in stool) ?      No fever ?12. PREGNANCY: "Is there any chance you are pregnant?" "When was your last menstrual period?" ?      *No Answer* ? ?Protocols used: Diarrhea-A-AH ? ?

## 2021-10-27 NOTE — Telephone Encounter (Signed)
3rd attempt, Patient called, left VM to return the call to the office to discuss symptoms with a nurse. Will route to practice.  ?Summary: diarrhea  ?  Pt called states has had diarrhea, for 2 days and is asking for assistance with with. Please call back.   ?  ? ? ? ?

## 2021-10-28 NOTE — Progress Notes (Signed)
?I,Savannah Erbe,acting as a scribe for [employer], PA-C.,have documented all relevant documentation on the behalf of Mikey Kirschner, PA-C,as directed by  Mikey Kirschner, PA-C while in the presence of Mikey Kirschner, PA-C. ? ?Acute Office Visit ? ?Subjective:  ? ? Patient ID: Tammy Sosa, female    DOB: [birth year], 51 y.o.   MRN: [id number] ? ?Cc. Diarrhea, depression ? ? ?HPI ?Tammy Sosa is a 51 y/o female who presents today for diarrhea x 2-3 months, worsening last week. Tammy Sosa is unable to eat anything without cramps and needing to have a bowel movement right away. Loose, watery. Denies blood in stool, but has seen blood on toilet paper.  ?Unsure if her hpylori is back, still has acid reflux symptoms. Tammy Sosa has been taking pepto bismol, pepcid, tums otc. ? ?Tammy Sosa reports not taking her zoloft in over a month. Feels her depression symptoms are worse, crying, overwhelmed, unfocused. Denies active SI ? ? ?  10/29/2021  ?  1:33 PM 03/29/2021  ? 11:04 AM 05/28/2020  ?  9:34 AM  ?PHQ9 SCORE ONLY  ?PHQ-9 Total Score _0 ? ? ? ?Past Medical History:  ?Diagnosis Date  ? Abnormal Papanicolaou smear of cervix with positive human papilloma virus (HPV) test 12/11/2014  ? LSIL/Mild dysplasia  ? 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 Asthma   ? as young adult  ? Atypical chest pain   ? a. sharp/fleeting  ? Atypical squamous cells of undetermined significance (ASCUS) on Papanicolaou smear of cervix 01/30/2013  ? Complication of anesthesia   ? BP drops  ? Family history of breast cancer in female   ? Family history of ovarian cancer   ? H/O arthroscopy of knee   ? Herniated cervical disc   ? History of conization of cervix   ? History of gestational diabetes   ? LGSIL on Pap smear of cervix 09/20/2012  ? Migraine headache   ? none in over 1 yr  ? Motion sickness   ? passenger in car  ? PFO (patent foramen ovale)   ? a. 08/2015 TEE:EF 60%, no rwma, large PFO w/ R->L shunt; b. 05/2019 Echo: EF 60-65%, nl RV size/fxn, nl PASP. Neg bubble study.  ?  Plantar fasciitis   ? Stroke [facility]) 2017  ? a. 08/2015 in setting of PFO-->ASA 38m daily.  ? Wears contact lenses   ? ? ?Past Surgical History:  ?Procedure Laterality Date  ? CERVICAL CONIZATION W/BX    ? COLONOSCOPY WITH PROPOFOL N/A 06/20/2018  ? Procedure: COLONOSCOPY WITH PROPOFOL;  Surgeon: VLin Landsman MD;  [facility] [facility]  Service: Endoscopy;  Laterality: N/A;  ? ESOPHAGOGASTRODUODENOSCOPY (EGD) WITH PROPOFOL N/A 06/20/2018  ? Procedure: ESOPHAGOGASTRODUODENOSCOPY (EGD) WITH PROPOFOL;  Surgeon: VLin Landsman MD;  [facility]  Service: Endoscopy;  Laterality: N/A;  ? INTRAUTERINE DEVICE (IUD) INSERTION  11/25/2015  ? Mirena  ? KNEE SURGERY Right   ? ? ?Family History  ?Problem Relation Age of Onset  ? Breast cancer Mother 559 ? Ovarian cancer Mother 345 ? Diabetes Mother   ? COPD Mother   ? Hypertension Mother   ? Skin cancer Father   ? Stomach cancer Paternal Uncle   ? Stomach cancer Maternal Grandfather   ? Diabetes Paternal Grandmother   ? ? ?Social History  ? ?Socioeconomic History  ? Marital status: Married  ?  Spouse name: Not on file  ? Number of children: Not on file  ? Years  of education: Not on file  ? Highest education level: Not on file  ?Occupational History  ? Not on file  ?Tobacco Use  ? Smoking status: Never  ? Smokeless tobacco: Never  ?Vaping Use  ? Vaping Use: Never used  ?Substance and Sexual Activity  ? Alcohol use: Yes  ?  Alcohol/week: 4.0 standard drinks  ?  Types: 1 Glasses of wine, 1 Cans of beer, 2 Shots of liquor per week  ? Drug use: No  ? Sexual activity: Yes  ?  Birth control/protection: I.U.D.  ?Other Topics Concern  ? Not on file  ?Social History Narrative  ? Not on file  ? ?Social Determinants of Health  ? ?Financial Resource Strain: Not on file  ?Food Insecurity: Not on file  ?Transportation Needs: Not on file  ?Physical Activity: Not on file  ?Stress: Not on file  ?Social Connections: Not on file  ?Intimate Partner Violence:  Not on file  ? ? ?Outpatient Medications Prior to Visit  ?Medication Sig Dispense Refill  ? albuterol (VENTOLIN HFA) 108 (90 Base) MCG/ACT inhaler Inhale 2 puffs into the lungs every 6 (six) hours as needed for wheezing or shortness of breath. 8 g 2  ? aspirin EC 81 MG tablet Take 81 mg by mouth.    ? Azelastine HCl 137 MCG/SPRAY SOLN PLACE 2 SPRAYS INTO BOTH NOSTRILS 2 (TWO) TIMES DAILY. USE IN EACH NOSTRIL AS DIRECTED 90 mL 0  ? levonorgestrel (MIRENA) 20 MCG/24HR IUD 1 each by Intrauterine route once.    ? meloxicam (MOBIC) 15 MG tablet TAKE 1 TABLET BY MOUTH EVERY DAY AS NEEDED FOR PAIN 90 tablet 3  ? sertraline (ZOLOFT) 100 MG tablet Take 1/2 tablet daily for eight days, then one tablet daily 90 tablet 3  ? atorvastatin (LIPITOR) 10 MG tablet Take 1 tablet (10 mg total) by mouth daily. (Patient not taking: Reported on 03/29/2021) 90 tablet 3  ? amoxicillin-clavulanate (AUGMENTIN) 875-125 MG tablet Take 1 tablet by mouth 2 (two) times daily. (Patient not taking: Reported on 10/29/2021) 20 tablet 0  ? esomeprazole (NEXIUM) 40 MG capsule Take 1 capsule (40 mg total) by mouth daily at 12 noon. (Patient not taking: Reported on 10/29/2021) 30 capsule 2  ? ?No facility-administered medications prior to visit.  ? ? ?No Known Allergies ? ?Review of Systems  ?Constitutional:  Positive for fatigue. Negative for fever.  ?Respiratory:  Negative for cough and shortness of breath.   ?Cardiovascular:  Negative for chest pain and leg swelling.  ?Gastrointestinal:  Positive for abdominal pain, blood in stool and diarrhea. Negative for nausea and vomiting.  ?Neurological:  Negative for dizziness and headaches.  ?Psychiatric/Behavioral:  The patient is nervous/anxious.   ? ?   ?Objective:  ?  ?Physical Exam ?Constitutional:   ?   General: Tammy Sosa is awake.  ?   Appearance: Tammy Sosa is well-developed.  ?HENT:  ?   Head: Normocephalic.  ?Eyes:  ?   Conjunctiva/sclera: Conjunctivae normal.  ?Cardiovascular:  ?   Rate and Rhythm: Normal rate and  regular rhythm.  ?   Heart sounds: Normal heart sounds.  ?Pulmonary:  ?   Effort: Pulmonary effort is normal.  ?   Breath sounds: Normal breath sounds.  ?Abdominal:  ?   Palpations: Abdomen is soft. There is no hepatomegaly.  ?   Tenderness: There is generalized abdominal tenderness. There is no guarding. Negative signs include Murphy's sign and McBurney's sign.  ?Skin: ?   General: Skin is warm.  ?Neurological:  ?  Mental Status: Tammy Sosa is alert and oriented to person, place, and time.  ?Psychiatric:     ?   Attention and Perception: Attention normal.     ?   Mood and Affect: Mood normal.     ?   Speech: Speech normal.     ?   Behavior: Behavior is cooperative.  ? ? ?BP 114/73 (BP Location: Left Arm, Patient Position: Sitting, Cuff Size: Normal)   Pulse 74   Ht _0  (1.575 m)   Wt 194 lb 11.2 oz (88.3 kg)   SpO2 99%   BMI 35.61 kg/m?  ?Wt Readings from Last 3 Encounters:  ?10/29/21 194 lb 11.2 oz (88.3 kg)  ?09/02/21 194 lb (88 kg)  ?03/29/21 201 lb (91.2 kg)  ? ? ?Health Maintenance Due  ?Topic Date Due  ? COVID-19 Vaccine (1) Never done  ? MAMMOGRAM  02/24/2021  ? ? ?There are no preventive care reminders to display for this patient. ? ? ?Lab Results  ?Component Value Date  ? TSH 1.740 05/28/2020  ? ?Lab Results  ?Component Value Date  ? WBC 8.4 05/28/2020  ? HGB 12.8 05/28/2020  ? HCT 37.8 05/28/2020  ? MCV 88 05/28/2020  ? PLT 265 05/28/2020  ? ?Lab Results  ?Component Value Date  ? NA 144 04/08/2021  ? K 4.2 04/08/2021  ? CO2 23 04/08/2021  ? GLUCOSE 98 04/08/2021  ? BUN 15 04/08/2021  ? CREATININE 0.87 04/08/2021  ? BILITOT <0.2 04/08/2021  ? ALKPHOS 57 04/08/2021  ? AST 15 04/08/2021  ? ALT 15 04/08/2021  ? PROT 6.6 04/08/2021  ? ALBUMIN 4.2 04/08/2021  ? CALCIUM 9.4 04/08/2021  ? ANIONGAP 8 10/08/2020  ? EGFR 83 04/08/2021  ? ?Lab Results  ?Component Value Date  ? CHOL 177 04/08/2021  ? ?Lab Results  ?Component Value Date  ? HDL 46 04/08/2021  ? ?Lab Results  ?Component Value Date  ? LDLCALC 119 (H)  04/08/2021  ? ?Lab Results  ?Component Value Date  ? TRIG 62 04/08/2021  ? ?Lab Results  ?Component Value Date  ? CHOLHDL 3.8 04/08/2021  ? ?Lab Results  ?Component Value Date  ? HGBA1C 5.3 08/23/2015  ? ? ?

## 2021-10-29 ENCOUNTER — Encounter: Payer: Self-pay | Admitting: Physician Assistant

## 2021-10-29 ENCOUNTER — Ambulatory Visit (INDEPENDENT_AMBULATORY_CARE_PROVIDER_SITE_OTHER): Payer: BC Managed Care – PPO | Admitting: Physician Assistant

## 2021-10-29 VITALS — BP 114/73 | HR 74 | Ht 62.0 in | Wt 194.7 lb

## 2021-10-29 DIAGNOSIS — R197 Diarrhea, unspecified: Secondary | ICD-10-CM

## 2021-10-29 DIAGNOSIS — K219 Gastro-esophageal reflux disease without esophagitis: Secondary | ICD-10-CM | POA: Diagnosis not present

## 2021-10-29 DIAGNOSIS — F332 Major depressive disorder, recurrent severe without psychotic features: Secondary | ICD-10-CM | POA: Insufficient documentation

## 2021-10-29 DIAGNOSIS — F322 Major depressive disorder, single episode, severe without psychotic features: Secondary | ICD-10-CM

## 2021-10-29 MED ORDER — BUPROPION HCL ER (XL) 150 MG PO TB24: 150.0000 mg | ORAL_TABLET | Freq: Every day | ORAL | 1 refills | Status: DC

## 2021-10-29 MED ORDER — AZITHROMYCIN 250 MG PO TABS: ORAL_TABLET | ORAL | 0 refills | Status: AC

## 2021-10-29 MED ORDER — ESOMEPRAZOLE MAGNESIUM 40 MG PO CPDR: DELAYED_RELEASE_CAPSULE | ORAL | 1 refills | Status: DC

## 2021-10-29 NOTE — Assessment & Plan Note (Signed)
Discussed therapy, pt declined. History of poor experience with therapy in childhood ?Pt has self d/c zoloft ?Advised we switch to wellbutrin, 150 mg XR after one week if no SE can increase to 300 mg daily.  ?F/y 6-8 weeks ?

## 2021-10-29 NOTE — Assessment & Plan Note (Signed)
Pt never got the PPI from pharmacy ?Advised to take esomeprazole in AM before food, ok to take pepcid daily if helps. ?Since she is not taking any PPIs at the moment, will eval hplyori breath test ?

## 2021-10-29 NOTE — Assessment & Plan Note (Signed)
Advised pepto bismol, rx azithromycin x 5 days ?Bland diet, increase fluids ?Will check cmp/cbc ?

## 2021-10-30 LAB — CBC WITH DIFFERENTIAL/PLATELET
Basophils Absolute: 0.1 10*3/uL (ref 0.0–0.2)
Basos: 1 %
EOS (ABSOLUTE): 0.2 10*3/uL (ref 0.0–0.4)
Eos: 2 %
Hematocrit: 40.4 % (ref 34.0–46.6)
Hemoglobin: 13.9 g/dL (ref 11.1–15.9)
Immature Grans (Abs): 0 10*3/uL (ref 0.0–0.1)
Immature Granulocytes: 0 %
Lymphocytes Absolute: 2.6 10*3/uL (ref 0.7–3.1)
Lymphs: 33 %
MCH: 29.4 pg (ref 26.6–33.0)
MCHC: 34.4 g/dL (ref 31.5–35.7)
MCV: 85 fL (ref 79–97)
Monocytes Absolute: 0.6 10*3/uL (ref 0.1–0.9)
Monocytes: 7 %
Neutrophils Absolute: 4.4 10*3/uL (ref 1.4–7.0)
Neutrophils: 57 %
Platelets: 278 10*3/uL (ref 150–450)
RBC: 4.73 x10E6/uL (ref 3.77–5.28)
RDW: 13.2 % (ref 11.7–15.4)
WBC: 7.8 10*3/uL (ref 3.4–10.8)

## 2021-10-30 LAB — COMPREHENSIVE METABOLIC PANEL
ALT: 21 IU/L (ref 0–32)
AST: 13 IU/L (ref 0–40)
Albumin/Globulin Ratio: 1.8 (ref 1.2–2.2)
Albumin: 4.4 g/dL (ref 3.8–4.8)
Alkaline Phosphatase: 79 IU/L (ref 44–121)
BUN/Creatinine Ratio: 18 (ref 9–23)
BUN: 13 mg/dL (ref 6–24)
Bilirubin Total: 0.2 mg/dL (ref 0.0–1.2)
CO2: 21 mmol/L (ref 20–29)
Calcium: 9.8 mg/dL (ref 8.7–10.2)
Chloride: 105 mmol/L (ref 96–106)
Creatinine, Ser: 0.72 mg/dL (ref 0.57–1.00)
Globulin, Total: 2.4 g/dL (ref 1.5–4.5)
Glucose: 91 mg/dL (ref 70–99)
Potassium: 4.2 mmol/L (ref 3.5–5.2)
Sodium: 142 mmol/L (ref 134–144)
Total Protein: 6.8 g/dL (ref 6.0–8.5)
eGFR: 103 mL/min/{1.73_m2} (ref >59)

## 2021-11-01 ENCOUNTER — Ambulatory Visit: Payer: BC Managed Care – PPO | Admitting: Family Medicine

## 2021-11-08 DIAGNOSIS — D509 Iron deficiency anemia, unspecified: Secondary | ICD-10-CM | POA: Diagnosis not present

## 2021-11-08 DIAGNOSIS — Z713 Dietary counseling and surveillance: Secondary | ICD-10-CM | POA: Diagnosis not present

## 2021-11-08 DIAGNOSIS — Z6835 Body mass index (BMI) 35.0-35.9, adult: Secondary | ICD-10-CM | POA: Diagnosis not present

## 2021-11-08 DIAGNOSIS — E6609 Other obesity due to excess calories: Secondary | ICD-10-CM | POA: Diagnosis not present

## 2021-11-22 DIAGNOSIS — M150 Primary generalized (osteo)arthritis: Secondary | ICD-10-CM | POA: Diagnosis not present

## 2021-11-22 DIAGNOSIS — F331 Major depressive disorder, recurrent, moderate: Secondary | ICD-10-CM | POA: Diagnosis not present

## 2021-11-22 DIAGNOSIS — D509 Iron deficiency anemia, unspecified: Secondary | ICD-10-CM | POA: Diagnosis not present

## 2021-11-22 DIAGNOSIS — Z6834 Body mass index (BMI) 34.0-34.9, adult: Secondary | ICD-10-CM | POA: Diagnosis not present

## 2021-11-30 ENCOUNTER — Ambulatory Visit: Payer: BC Managed Care – PPO | Admitting: Family Medicine

## 2021-12-07 DIAGNOSIS — E6609 Other obesity due to excess calories: Secondary | ICD-10-CM | POA: Diagnosis not present

## 2021-12-07 DIAGNOSIS — Z713 Dietary counseling and surveillance: Secondary | ICD-10-CM | POA: Diagnosis not present

## 2021-12-07 DIAGNOSIS — Z6834 Body mass index (BMI) 34.0-34.9, adult: Secondary | ICD-10-CM | POA: Diagnosis not present

## 2021-12-07 DIAGNOSIS — D509 Iron deficiency anemia, unspecified: Secondary | ICD-10-CM | POA: Diagnosis not present

## 2021-12-20 ENCOUNTER — Other Ambulatory Visit: Payer: Self-pay | Admitting: Physician Assistant

## 2021-12-20 DIAGNOSIS — Z833 Family history of diabetes mellitus: Secondary | ICD-10-CM | POA: Diagnosis not present

## 2021-12-20 DIAGNOSIS — F322 Major depressive disorder, single episode, severe without psychotic features: Secondary | ICD-10-CM

## 2021-12-20 DIAGNOSIS — F331 Major depressive disorder, recurrent, moderate: Secondary | ICD-10-CM | POA: Diagnosis not present

## 2021-12-20 DIAGNOSIS — R252 Cramp and spasm: Secondary | ICD-10-CM | POA: Diagnosis not present

## 2021-12-20 DIAGNOSIS — Z6834 Body mass index (BMI) 34.0-34.9, adult: Secondary | ICD-10-CM | POA: Diagnosis not present

## 2022-01-03 DIAGNOSIS — Z6834 Body mass index (BMI) 34.0-34.9, adult: Secondary | ICD-10-CM | POA: Diagnosis not present

## 2022-01-03 DIAGNOSIS — E6609 Other obesity due to excess calories: Secondary | ICD-10-CM | POA: Diagnosis not present

## 2022-01-03 DIAGNOSIS — Z713 Dietary counseling and surveillance: Secondary | ICD-10-CM | POA: Diagnosis not present

## 2022-01-03 DIAGNOSIS — M150 Primary generalized (osteo)arthritis: Secondary | ICD-10-CM | POA: Diagnosis not present

## 2022-01-26 NOTE — Progress Notes (Deleted)
     I,Sha'taria Troyce Febo,acting as a Neurosurgeon for [employer], PA-C.,have documented all relevant documentation on the behalf of Alfredia Ferguson, PA-C,as directed by  Alfredia Ferguson, PA-C while in the presence of Alfredia Ferguson, PA-C.   Established patient visit   Patient: Tammy Sosa   DOB: 06-12-1971   51 y.o. Female  MRN: [id number] Visit Date: 01/27/2022  Today's healthcare provider: Alfredia Ferguson, PA-C   No chief complaint on file.  Subjective    HPI  Depression, Follow-up  She  was last seen for this 6 weeks ago. Changes made at last visit include switch to wellbutrin, 150 mg XR after one week if no SE can increase to 300 mg daily.   She reports {excellent/good/fair/poor:19665} compliance with treatment. She {is/is not:21021397} having side effects. ***  She reports {DESC; GOOD/FAIR/POOR:18685} tolerance of treatment. Current symptoms include: {Symptoms; depression:1002} She feels she is {improved/worse/unchanged:3041574} since last visit.     10/29/2021    1:33 PM 03/29/2021   11:04 AM 05/28/2020    9:34 AM  Depression screen PHQ 2/9  Decreased Interest 2 2 2   Down, Depressed, Hopeless 3 2 1   PHQ - 2 Score 5 4 3   Altered sleeping 3 3 3   Tired, decreased energy 3 3 3   Change in appetite 3 3 3   Feeling bad or failure about yourself  2 2 1   Trouble concentrating 3 2 0  Moving slowly or fidgety/restless 2 0 1  Suicidal thoughts 2 0 1  PHQ-9 Score 23 17 15   Difficult doing work/chores Very difficult Somewhat difficult Somewhat difficult    -----------------------------------------------------------------------------------------   Medications: Outpatient Medications Prior to Visit  Medication Sig   albuterol (VENTOLIN HFA) 108 (90 Base) MCG/ACT inhaler Inhale 2 puffs into the lungs every 6 (six) hours as needed for wheezing or shortness of breath.   aspirin EC 81 MG tablet Take 81 mg by mouth.   atorvastatin (LIPITOR) 10 MG tablet Take 1 tablet (10 mg total) by  mouth daily. (Patient not taking: Reported on 03/29/2021)   Azelastine HCl 137 MCG/SPRAY SOLN PLACE 2 SPRAYS INTO BOTH NOSTRILS 2 (TWO) TIMES DAILY. USE IN EACH NOSTRIL AS DIRECTED   buPROPion (WELLBUTRIN XL) 150 MG 24 hr tablet TAKE 1 TABLET BY MOUTH EVERY DAY   esomeprazole (NEXIUM) 40 MG capsule Take first thing in the morning on an empty stomach   levonorgestrel (MIRENA) 20 MCG/24HR IUD 1 each by Intrauterine route once.   meloxicam (MOBIC) 15 MG tablet TAKE 1 TABLET BY MOUTH EVERY DAY AS NEEDED FOR PAIN   No facility-administered medications prior to visit.    Review of Systems  {Labs  Heme  Chem  Endocrine  Serology  Results Review (optional):23779}   Objective    There were no vitals taken for this visit. {Show previous vital signs (optional):23777}  Physical Exam  ***  No results found for any visits on 01/27/22.  Assessment & Plan     ***  No follow-ups on file.      {provider attestation***:1}   , PA-C  [facility] [phone] (phone) [phone] (fax)  [facility]

## 2022-01-27 ENCOUNTER — Ambulatory Visit: Payer: BC Managed Care – PPO | Admitting: Physician Assistant

## 2022-02-11 ENCOUNTER — Telehealth: Payer: Self-pay | Admitting: Cardiovascular Disease

## 2022-02-11 NOTE — Telephone Encounter (Signed)
3 attempts to schedule fu appt from recall list.   Deleting recall.   

## 2022-04-01 HISTORY — PX: CHOLECYSTECTOMY: SHX55

## 2022-04-01 HISTORY — PX: UMBILICAL HERNIA REPAIR: SHX196

## 2022-04-17 DIAGNOSIS — R1011 Right upper quadrant pain: Secondary | ICD-10-CM | POA: Diagnosis not present

## 2022-04-17 DIAGNOSIS — R110 Nausea: Secondary | ICD-10-CM | POA: Diagnosis not present

## 2022-04-17 DIAGNOSIS — K8012 Calculus of gallbladder with acute and chronic cholecystitis without obstruction: Secondary | ICD-10-CM | POA: Diagnosis not present

## 2022-04-17 DIAGNOSIS — K819 Cholecystitis, unspecified: Secondary | ICD-10-CM | POA: Diagnosis not present

## 2022-04-17 DIAGNOSIS — Z9889 Other specified postprocedural states: Secondary | ICD-10-CM | POA: Diagnosis not present

## 2022-04-17 DIAGNOSIS — R1013 Epigastric pain: Secondary | ICD-10-CM | POA: Diagnosis not present

## 2022-04-17 DIAGNOSIS — E669 Obesity, unspecified: Secondary | ICD-10-CM | POA: Diagnosis not present

## 2022-04-17 DIAGNOSIS — K8000 Calculus of gallbladder with acute cholecystitis without obstruction: Secondary | ICD-10-CM | POA: Diagnosis not present

## 2022-04-17 DIAGNOSIS — Z8711 Personal history of peptic ulcer disease: Secondary | ICD-10-CM | POA: Diagnosis not present

## 2022-04-17 DIAGNOSIS — Z79899 Other long term (current) drug therapy: Secondary | ICD-10-CM | POA: Diagnosis not present

## 2022-04-17 DIAGNOSIS — Z6833 Body mass index (BMI) 33.0-33.9, adult: Secondary | ICD-10-CM | POA: Diagnosis not present

## 2022-04-17 DIAGNOSIS — K8010 Calculus of gallbladder with chronic cholecystitis without obstruction: Secondary | ICD-10-CM | POA: Diagnosis not present

## 2022-04-17 DIAGNOSIS — Z7982 Long term (current) use of aspirin: Secondary | ICD-10-CM | POA: Diagnosis not present

## 2022-04-17 DIAGNOSIS — R932 Abnormal findings on diagnostic imaging of liver and biliary tract: Secondary | ICD-10-CM | POA: Diagnosis not present

## 2022-04-26 ENCOUNTER — Ambulatory Visit: Payer: Self-pay

## 2022-04-26 NOTE — Telephone Encounter (Signed)
LVMTCB. CRM created. Ok for [facility] to advise and schedule appointment upon patients return call

## 2022-04-26 NOTE — Telephone Encounter (Signed)
Scheduled to see [facility] tomorrow

## 2022-04-26 NOTE — Telephone Encounter (Signed)
Summary: Possible yeast infection   Pt states she has a yeast infection and requesting an antibiotic   Please assist further       Left message to call back about symptoms.

## 2022-04-26 NOTE — Telephone Encounter (Signed)
Reason for Disposition  [1] Symptoms of a yeast infection (i.e., itchy, white discharge, not bad smelling) AND [2] not improved > 3 days following Care Advice  Answer Assessment - Initial Assessment Questions 1. SYMPTOM: "What's the main symptom you're concerned about?" (e.g., pain, itching, dryness)     I had my gallbladder removed a week ago.  Now I'm having vaginal itching and a white discharge.   No burning with urination.   No antibiotics given except the one dose in my IV right before surgery. 2. LOCATION: "Where is the  itching located?" (e.g., inside/outside, left/right)     Vaginal area along with white discharge 3. ONSET: "When did the  discharge  start?"     During this past week. 4. PAIN: "Is there any pain?" If Yes, ask: "How bad is it?" (Scale: 1-10; mild, moderate, severe)   -  MILD (1-3): Doesn't interfere with normal activities.    -  MODERATE (4-7): Interferes with normal activities (e.g., work or school) or awakens from sleep.     -  SEVERE (8-10): Excruciating pain, unable to do any normal activities.     No pain or burning 5. ITCHING: "Is there any itching?" If Yes, ask: "How bad is it?" (Scale: 1-10; mild, moderate, severe)     Yes 6. CAUSE: "What do you think is causing the discharge?" "Have you had the same problem before? What happened then?"     Vaginal yeast infection.   1st one I've had in 20 yrs.   Maybe from the surgery. 7. OTHER SYMPTOMS: "Do you have any other symptoms?" (e.g., fever, itching, vaginal bleeding, pain with urination, injury to genital area, vaginal foreign body)     No other than what's listed above 8. PREGNANCY: "Is there any chance you are pregnant?" "When was your last menstrual period?"     Not asked  Protocols used: Vaginal Symptoms-A-AH  Chief Complaint: Vaginal itching and white discharge.  Requesting the pill for yeast infection be called in.   Just had gallbladder removed a week ago. Symptoms: itching and a white discharge Frequency:  Started this past week after having surgery Pertinent Negatives: Patient denies burning or pain with urination Disposition: [] ED /[] Urgent Care (no appt availability in office) / [] Appointment(In office/virtual)/ []  [place] [facility]/ [] Home Care/ [] Refused Recommended Disposition /[] [place] [facility]/ [x]  Follow-up with PCP Additional Notes: Did not want to come in if possible.   Just had surgery a week ago.   Requesting the pill for yeast infection be called in, if possible.   Message sent to Mikey Kirschner, PA-C.   I did let pt know she may need to be seen.  Pt was agreeable if Ria Comment wanted to see her but preferred not to come in.

## 2022-04-27 ENCOUNTER — Other Ambulatory Visit (HOSPITAL_COMMUNITY)
Admission: RE | Admit: 2022-04-27 | Discharge: 2022-04-27 | Disposition: A | Discharge status: Home / Self Care | Payer: BC Managed Care – PPO | Source: Ambulatory Visit | Attending: Family Medicine | Admitting: Family Medicine

## 2022-04-27 ENCOUNTER — Other Ambulatory Visit: Payer: Self-pay | Admitting: Family Medicine

## 2022-04-27 ENCOUNTER — Encounter: Payer: Self-pay | Admitting: Family Medicine

## 2022-04-27 ENCOUNTER — Ambulatory Visit (INDEPENDENT_AMBULATORY_CARE_PROVIDER_SITE_OTHER): Payer: BC Managed Care – PPO | Admitting: Family Medicine

## 2022-04-27 VITALS — BP 115/70 | HR 68 | Resp 16 | Ht 63.0 in | Wt 183.0 lb

## 2022-04-27 DIAGNOSIS — Z124 Encounter for screening for malignant neoplasm of cervix: Secondary | ICD-10-CM | POA: Insufficient documentation

## 2022-04-27 DIAGNOSIS — B3731 Acute candidiasis of vulva and vagina: Secondary | ICD-10-CM

## 2022-04-27 DIAGNOSIS — R87610 Atypical squamous cells of undetermined significance on cytologic smear of cervix (ASC-US): Secondary | ICD-10-CM | POA: Insufficient documentation

## 2022-04-27 MED ORDER — NYSTATIN-TRIAMCINOLONE 100000-0.1 UNIT/GM-% EX OINT: 1.0000 | TOPICAL_OINTMENT | Freq: Two times a day (BID) | CUTANEOUS | 0 refills | Status: DC

## 2022-04-27 MED ORDER — FLUCONAZOLE 150 MG PO TABS: ORAL_TABLET | ORAL | 0 refills | Status: DC

## 2022-04-27 NOTE — Addendum Note (Signed)
Addended by: Smitty Knudsen on: 04/27/2022 04:52 PM   Modules accepted: Orders

## 2022-04-27 NOTE — Assessment & Plan Note (Signed)
PMH of abnormal PAP with LSIL s/p bx Unsure when last PAP was completed Will do PAP cotesting today given previous prevalence of HPV

## 2022-04-27 NOTE — Telephone Encounter (Signed)
Requested medication (s) are due for refill today: signed today  Requested medication (s) are on the active medication list: yes  Last refill:  04/27/22 #30 g 0 refills  Future visit scheduled: no   Notes to clinic:  Pharmacy comment: Alternative Requested:COMBINATION OINTMENT NOT COVERED. IF YOU CHANGE TO INDIVIDUAL RXS INSURANCE SHOULD COVER IT.  Medication not assigned to a protocol . Please advise .     Requested Prescriptions  Pending Prescriptions Disp Refills   nystatin-triamcinolone ointment ([place]) [Pharmacy Med Name: NYSTATIN-TRIAMCINOLONE OINTM] 30 g 0    Sig: APPLY 1 APPLICATION TOPICALLY 2 (TWO) TIMES DAILY. APPLY TO OUTSIDE OF VAGINA AND LABIA     Off-Protocol Failed - 04/27/2022  4:29 PM      Failed - Medication not assigned to a protocol, review manually.      Passed - Valid encounter within last 12 months    Recent Outpatient Visits           Today Yeast infection involving the vagina and surrounding area   [facility] Tally Joe T, FNP   6 months ago Diarrhea, unspecified type   [facility] Thedore Mins, [place], PA-C   7 months ago Acute non-recurrent frontal sinusitis   [facility] Mikey Kirschner, PA-C   1 year ago Anxiety and depression   [facility] Birdie Sons, MD   1 year ago Annual physical exam   [facility] Carles Collet M, [place]

## 2022-04-27 NOTE — Telephone Encounter (Signed)
Requested medication (s) are due for refill today:   Prescribed today  Requested medication (s) are on the active medication list:   Yes  Future visit scheduled:   Today by Tally Joe   Last ordered: Today 9/27  Returned because phar. Requesting an alternative.  This isn't covered.  $125   Requested Prescriptions  Pending Prescriptions Disp Refills   nystatin-triamcinolone ointment (MYCOLOG) [Pharmacy Med Name: NYSTATIN-TRIAMCINOLONE OINTM] 30 g 0    Sig: Apply 1 Application topically 2 (two) times daily. Apply to outside of vagina and labia     Off-Protocol Failed - 04/27/2022  2:18 PM      Failed - Medication not assigned to a protocol, review manually.      Passed - Valid encounter within last 12 months    Recent Outpatient Visits           Today Yeast infection involving the vagina and surrounding area   [facility] Tally Joe T, FNP   6 months ago Diarrhea, unspecified type   [facility] Thedore Mins, [place], PA-C   7 months ago Acute non-recurrent frontal sinusitis   [facility] Mikey Kirschner, PA-C   1 year ago Anxiety and depression   [facility] Birdie Sons, MD   1 year ago Annual physical exam   [facility] Carles Collet M, [place]

## 2022-04-27 NOTE — Assessment & Plan Note (Signed)
Acute, 1 week of symptoms Not improved with OTC cream based options Will use nystatin-kenalog for external irritation and diflucan 150 1-2 tablets based on resolution of symptoms NuSwab+ completed

## 2022-04-27 NOTE — Progress Notes (Signed)
Established patient visit   Patient: Tammy Sosa   DOB: 03/19/1971   51 y.o. Female  MRN: [id number] Visit Date: 04/27/2022  Today's healthcare provider: [name], FNP   I,Tiffany J Bragg,acting as a scribe for [name], FNP.,have documented all relevant documentation on the behalf of [name], FNP,as directed by  [name], FNP while in the presence of [name], FNP.   Chief Complaint  Patient presents with   Vaginitis    Patient complains of vaginal irritation, itching, abnormal discharge for a week.    Subjective    HPI HPI     Vaginitis    Additional comments: Patient complains of vaginal irritation, itching, abnormal discharge for a week.       Last edited by Marlana Salvage, CMA on 04/27/2022  1:40 PM.       Medications: Outpatient Medications Prior to Visit  Medication Sig   albuterol (VENTOLIN HFA) 108 (90 Base) MCG/ACT inhaler Inhale 2 puffs into the lungs every 6 (six) hours as needed for wheezing or shortness of breath.   buPROPion (WELLBUTRIN XL) 150 MG 24 hr tablet TAKE 1 TABLET BY MOUTH EVERY DAY   levonorgestrel (MIRENA) 20 MCG/24HR IUD 1 each by Intrauterine route once.   meloxicam (MOBIC) 15 MG tablet TAKE 1 TABLET BY MOUTH EVERY DAY AS NEEDED FOR PAIN   aspirin EC 81 MG tablet Take 81 mg by mouth. (Patient not taking: Reported on 04/27/2022)   atorvastatin (LIPITOR) 10 MG tablet Take 1 tablet (10 mg total) by mouth daily. (Patient not taking: Reported on 04/27/2022)   Azelastine HCl 137 MCG/SPRAY SOLN PLACE 2 SPRAYS INTO BOTH NOSTRILS 2 (TWO) TIMES DAILY. USE IN EACH NOSTRIL AS DIRECTED (Patient not taking: Reported on 04/27/2022)   esomeprazole (NEXIUM) 40 MG capsule Take first thing in the morning on an empty stomach (Patient not taking: Reported on 04/27/2022)   No facility-administered medications prior to visit.    Review of Systems    Objective    BP 115/70 (BP Location: Right Arm, Patient Position: Sitting, Cuff  Size: Normal)   Pulse 68   Resp 16   Ht 5\' 3"  (1.6 m)   Wt 183 lb (83 kg)   SpO2 97%   BMI 32.42 kg/m   Physical Exam Exam conducted with a chaperone present.  Constitutional:      General: She is not in acute distress.    Appearance: Normal appearance. She is obese. She is not ill-appearing, toxic-appearing or diaphoretic.  Abdominal:     General: There is distension.     Palpations: Abdomen is soft.     Tenderness: There is abdominal tenderness.     Hernia: There is no hernia in the left inguinal area or right inguinal area.     Comments: RUQ tenderness s/p gallbladder removal; steristrips reapplied per pt request   Genitourinary:    Exam position: Lithotomy position.     Pubic Area: Rash present.     Tanner stage (genital): 5.     Vagina: Vaginal discharge present.     Cervix: Cervical motion tenderness present.     Uterus: Normal.      Adnexa: Right adnexa normal and left adnexa normal.     Comments: Hx of abnormal PAP; IUD in place Lymphadenopathy:     Lower Body: No right inguinal adenopathy. No left inguinal adenopathy.  Neurological:     Mental Status: She is alert.  Psychiatric:  Mood and Affect: Mood normal.        Behavior: Behavior normal.        Thought Content: Thought content normal.        Judgment: Judgment normal.      No results found for any visits on 04/27/22.  Assessment & Plan     Problem List Items Addressed This Visit       Genitourinary   Yeast infection involving the vagina and surrounding area - Primary    Acute, 1 week of symptoms Not improved with OTC cream based options Will use nystatin-kenalog for external irritation and diflucan 150 1-2 tablets based on resolution of symptoms NuSwab+ completed       Relevant Medications   fluconazole (DIFLUCAN) 150 MG tablet   nystatin-triamcinolone ointment (MYCOLOG)     Other   Encounter for Papanicolaou smear for cervical cancer screening    PMH of abnormal PAP with LSIL s/p  bx Unsure when last PAP was completed Will do PAP cotesting today given previous prevalence of HPV       Relevant Orders   Cytology - PAP     Return if symptoms worsen or fail to improve.      Vonna Kotyk, FNP, have reviewed all documentation for this visit. The documentation on 04/27/22 for the exam, diagnosis, procedures, and orders are all accurate and complete.    Gwyneth Sprout, [place] [phone] (phone) [phone] (fax)  [place]

## 2022-04-28 MED ORDER — TRIAMCINOLONE ACETONIDE 0.1 % EX CREA: 1.0000 | TOPICAL_CREAM | Freq: Two times a day (BID) | CUTANEOUS | 0 refills | Status: DC

## 2022-04-29 LAB — CERVICOVAGINAL ANCILLARY ONLY
Bacterial Vaginitis (gardnerella): NEGATIVE
Candida Glabrata: NEGATIVE
Candida Vaginitis: POSITIVE — AB
Comment: NEGATIVE
Comment: NEGATIVE
Comment: NEGATIVE

## 2022-04-29 NOTE — Progress Notes (Signed)
Yeast confirmed on CV screening I would recommend the second dose of diflucan to ensure complete coverage/treatment Tammy Sosa, [place] [address] [name], Tammy Sosa [address] [phone] (phone) [phone] (fax) [name]

## 2022-05-05 ENCOUNTER — Other Ambulatory Visit: Payer: Self-pay | Admitting: Family Medicine

## 2022-05-05 ENCOUNTER — Telehealth: Payer: Self-pay

## 2022-05-05 DIAGNOSIS — B977 Papillomavirus as the cause of diseases classified elsewhere: Secondary | ICD-10-CM

## 2022-05-05 DIAGNOSIS — R87610 Atypical squamous cells of undetermined significance on cytologic smear of cervix (ASC-US): Secondary | ICD-10-CM

## 2022-05-05 LAB — CYTOLOGY - PAP
Chlamydia: NEGATIVE
Comment: NEGATIVE
Comment: NEGATIVE
Comment: NEGATIVE
Comment: NEGATIVE
Comment: NEGATIVE
Comment: NEGATIVE
Comment: NORMAL
Diagnosis: UNDETERMINED — AB
HPV 16: NEGATIVE
HPV 18 / 45: POSITIVE — AB
HSV1: NEGATIVE
HSV2: NEGATIVE
High risk HPV: POSITIVE — AB
Neisseria Gonorrhea: NEGATIVE
Trichomonas: NEGATIVE

## 2022-05-05 NOTE — Telephone Encounter (Signed)
Copied from [place]. Topic: General - Call Back - No Documentation >> May 05, 2022  2:37 PM Tammy Sosa wrote: Pt returning call  Please assist further

## 2022-05-05 NOTE — Telephone Encounter (Signed)
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  Gwyneth Sprout, [address] [place], [place] [address] [phone] (phone) [phone] (fax) [place]  ----- Message ----- From: Interface, Lab In [facility] Sent: 04/29/2022  11:21 AM EDT To: Gwyneth Sprout, FNP

## 2022-05-05 NOTE — Telephone Encounter (Signed)
Contacted patient and provided Pap Smear results from 04/28/22. Patient verbalized understanding

## 2022-09-02 ENCOUNTER — Encounter: Payer: Self-pay | Admitting: Physician Assistant

## 2022-09-02 ENCOUNTER — Ambulatory Visit (INDEPENDENT_AMBULATORY_CARE_PROVIDER_SITE_OTHER): Payer: BC Managed Care – PPO | Admitting: Physician Assistant

## 2022-09-02 ENCOUNTER — Ambulatory Visit: Payer: Self-pay

## 2022-09-02 VITALS — BP 110/80 | HR 66 | Temp 98.3°F | Resp 17 | Ht 63.0 in | Wt 193.6 lb

## 2022-09-02 DIAGNOSIS — R300 Dysuria: Secondary | ICD-10-CM

## 2022-09-02 LAB — POCT URINALYSIS DIPSTICK
Bilirubin, UA: NEGATIVE
Glucose, UA: NEGATIVE
Ketones, UA: NEGATIVE
Nitrite, UA: NEGATIVE
Protein, UA: NEGATIVE
Spec Grav, UA: 1.015 (ref 1.010–1.025)
Urobilinogen, UA: 0.2 E.U./dL
pH, UA: 5 (ref 5.0–8.0)

## 2022-09-02 MED ORDER — NITROFURANTOIN MONOHYD MACRO 100 MG PO CAPS: 100.0000 mg | ORAL_CAPSULE | Freq: Two times a day (BID) | ORAL | 0 refills | Status: DC

## 2022-09-02 NOTE — Progress Notes (Unsigned)
   Acute Office Visit  Subjective:     Patient ID: Tammy Sosa, female    DOB: 09-Sep-1970, 52 y.o.   MRN: [id number]  Chief Complaint  Patient presents with   Urinary Tract Infection    Urinary Tract Infection  This is a new problem. The current episode started in the past 7 days. The problem occurs every urination. The problem has been gradually worsening. The quality of the pain is described as burning and aching. The pain is at a severity of 5/10. The pain is moderate. There has been no fever. She is Sexually active. There is No history of pyelonephritis. Associated symptoms include frequency, hematuria and urgency. She has tried increased fluids (the patient took a difllucan, because she thought that would treat her symptoms) for the symptoms. The treatment provided no relief.   Patient is in today for ***  Review of Systems  Genitourinary:  Positive for frequency, hematuria and urgency.  Temporary meds  Wondering a    Objective:    BP 110/80 (BP Location: Right Arm, Patient Position: Sitting, Cuff Size: Normal)   Pulse 66   Temp 98.3 F (36.8 C) (Oral)   Resp 17   Ht 5\' 3"  (1.6 m)   Wt 193 lb 9.6 oz (87.8 kg)   SpO2 99%   BMI 34.29 kg/m  {Vitals History (Optional):23777}  Physical Exam  Results for orders placed or performed in visit on 09/02/22  POCT Urinalysis Dipstick  Result Value Ref Range   Color, UA dark yellow    Clarity, UA cloudy    Glucose, UA Negative Negative   Bilirubin, UA negative    Ketones, UA negative    Spec Grav, UA 1.015 1.010 - 1.025   Blood, UA Large    pH, UA 5.0 5.0 - 8.0   Protein, UA Negative Negative   Urobilinogen, UA 0.2 0.2 or 1.0 E.U./dL   Nitrite, UA negative    Leukocytes, UA Large (3+) (A) Negative   Appearance cloudy    Odor          Assessment & Plan:   Problem List Items Addressed This Visit   None Visit Diagnoses     Dysuria    -  Primary   Relevant Orders   POCT Urinalysis Dipstick (Completed)        No orders of the defined types were placed in this encounter.   No follow-ups on file.  Wilson Singer, CMA

## 2022-09-02 NOTE — Telephone Encounter (Signed)
  Chief Complaint: urination pain, Symptoms:  frequency and urgency. Blood noted Frequency: Tuesday Pertinent Negatives: Patient denies back pain, fever Disposition: [] ED /[] Urgent Care (no appt availability in office) / [x] Appointment(In office/virtual)/ []  [place] [facility]/ [] Home Care/ [] Refused Recommended Disposition /[] [place] [facility]/ []  Follow-up with PCP Additional Notes: PCP not avail. Appts appt scheduled with Mardene Speak PA Reason for Disposition  Blood in urine (red, pink, or tea-colored)  Answer Assessment - Initial Assessment Questions 1. SEVERITY: "How bad is the pain?"  (e.g., Scale 1-10; mild, moderate, or severe)   - MILD (1-3): complains slightly about urination hurting   - MODERATE (4-7): interferes with normal activities     - SEVERE (8-10): excruciating, unwilling or unable to urinate because of the pain      mild 2. FREQUENCY: "How many times have you had painful urination today?"      3  3. PATTERN: "Is pain present every time you urinate or just sometimes?"      Every time 4. ONSET: "When did the painful urination start?"      Tuesday  5. FEVER: "Do you have a fever?" If Yes, ask: "What is your temperature, how was it measured, and when did it start?"     no 6. PAST UTI: "Have you had a urine infection before?" If Yes, ask: "When was the last time?" and "What happened that time?"      Yes-  7. CAUSE: "What do you think is causing the painful urination?"  (e.g., UTI, scratch, Herpes sore)     UTI 8. OTHER SYMPTOMS: "Do you have any other symptoms?" (e.g., blood in urine, flank pain, genital sores, urgency, vaginal discharge)     Blood - urgency and frequency  9. PREGNANCY: "Is there any chance you are pregnant?" "When was your last menstrual period?"     N/a  Protocols used: Urination Pain - Female-A-AH

## 2022-09-03 LAB — URINALYSIS, MICROSCOPIC ONLY
Casts: NONE SEEN /lpf
RBC, Urine: >30 /hpf — AB (ref 0–2)
WBC, UA: >30 /hpf — AB (ref 0–5)

## 2022-09-05 NOTE — Progress Notes (Signed)
Please, let pt know that her urine culture is back and showed Escherichia coli. her current medication should treat her symptoms. If she have any questions please, let me know. Thank you, Mardene Speak, [facility], [place] [phone])

## 2022-09-06 LAB — URINE CULTURE

## 2022-09-06 LAB — SPECIMEN STATUS REPORT

## 2022-11-16 DIAGNOSIS — J4531 Mild persistent asthma with (acute) exacerbation: Secondary | ICD-10-CM | POA: Diagnosis not present

## 2022-11-16 DIAGNOSIS — J209 Acute bronchitis, unspecified: Secondary | ICD-10-CM | POA: Diagnosis not present

## 2022-11-16 DIAGNOSIS — R0602 Shortness of breath: Secondary | ICD-10-CM | POA: Diagnosis not present

## 2022-11-16 DIAGNOSIS — R059 Cough, unspecified: Secondary | ICD-10-CM | POA: Diagnosis not present

## 2022-11-18 ENCOUNTER — Ambulatory Visit: Payer: Self-pay

## 2022-11-18 NOTE — Telephone Encounter (Signed)
  Chief Complaint: SOB Symptoms: SOB with cough and congestion, fatigue Frequency: 2 weeks Pertinent Negatives: NA Disposition: ED /[] Urgent Care (no appt availability in office) / Appointment(In office/virtual)/  [place] [facility]/ Home Care/ Refused Recommended Disposition /[] [place] [facility]/  Follow-up with PCP Additional Notes: pt went to [facility] UC on 11/16/22 and had CXR, dx with bronchitis and pt states that CXR was concerning in lower lobes and upper lobes had a lot of congestion. Pt was prescribed abx and prednisone and states she isn't feeling any better. Still having SOB with using Albuterol inhaler as well. Wasn't prescribed neb tx. Pt wanting to see an MD at the office but advised no appts with MD. Only appt today with Edmon Crape, PA. Pt was going to talk with husband. Advised her that would be best if she went to ED for FU and if needing another CXR or neb tx they would be able to perform that there. Pt verbalized understanding and said she would CB if needed.   Reason for Disposition  Continuous (nonstop) coughing interferes with work, school, or sleeping  Answer Assessment - Initial Assessment Questions 1. RESPIRATORY STATUS: "Describe your breathing?" (e.g., wheezing, shortness of breath, unable to speak, severe coughing)      SOB and cough 2. ONSET: "When did this breathing problem begin?"      2 weeks  3. PATTERN "Does the difficult breathing come and go, or has it been constant since it started?"      Constant  4. SEVERITY: "How bad is your breathing?" (e.g., mild, moderate, severe)    - MILD: No SOB at rest, mild SOB with walking, speaks normally in sentences, can lie down, no retractions, pulse < 100.    - MODERATE: SOB at rest, SOB with minimal exertion and prefers to sit, cannot lie down flat, speaks in phrases, mild retractions, audible wheezing, pulse 100-120.    - SEVERE: Very SOB at rest, speaks in single words, struggling to breathe, sitting  hunched forward, retractions, pulse > 120      Moderate  7. LUNG HISTORY: "Do you have any history of lung disease?"  (e.g., pulmonary embolus, asthma, emphysema)     Asthma  8. CAUSE: "What do you think is causing the breathing problem?"      Bronchitis  9. OTHER SYMPTOMS: "Do you have any other symptoms? (e.g., dizziness, runny nose, cough, chest pain, fever)     Cough and congestion and fatigue  Protocols used: Breathing Difficulty-A-AH

## 2022-11-19 ENCOUNTER — Emergency Department
Admission: EM | Admit: 2022-11-19 | Discharge: 2022-11-19 | Disposition: A | Discharge status: Home / Self Care | Payer: BC Managed Care – PPO | Attending: Emergency Medicine | Admitting: Emergency Medicine

## 2022-11-19 ENCOUNTER — Encounter: Payer: Self-pay | Admitting: Emergency Medicine

## 2022-11-19 ENCOUNTER — Other Ambulatory Visit: Payer: Self-pay

## 2022-11-19 ENCOUNTER — Emergency Department: Payer: BC Managed Care – PPO

## 2022-11-19 DIAGNOSIS — J40 Bronchitis, not specified as acute or chronic: Secondary | ICD-10-CM

## 2022-11-19 DIAGNOSIS — Z1152 Encounter for screening for COVID-19: Secondary | ICD-10-CM | POA: Insufficient documentation

## 2022-11-19 DIAGNOSIS — Z8673 Personal history of transient ischemic attack (TIA), and cerebral infarction without residual deficits: Secondary | ICD-10-CM | POA: Insufficient documentation

## 2022-11-19 DIAGNOSIS — R079 Chest pain, unspecified: Secondary | ICD-10-CM | POA: Diagnosis not present

## 2022-11-19 DIAGNOSIS — J4489 Other specified chronic obstructive pulmonary disease: Secondary | ICD-10-CM | POA: Insufficient documentation

## 2022-11-19 DIAGNOSIS — J45909 Unspecified asthma, uncomplicated: Secondary | ICD-10-CM | POA: Diagnosis not present

## 2022-11-19 DIAGNOSIS — R0602 Shortness of breath: Secondary | ICD-10-CM | POA: Diagnosis not present

## 2022-11-19 DIAGNOSIS — J45901 Unspecified asthma with (acute) exacerbation: Secondary | ICD-10-CM

## 2022-11-19 LAB — BASIC METABOLIC PANEL
Anion gap: 7 (ref 5–15)
BUN: 15 mg/dL (ref 6–20)
CO2: 23 mmol/L (ref 22–32)
Calcium: 8.9 mg/dL (ref 8.9–10.3)
Chloride: 109 mmol/L (ref 98–111)
Creatinine, Ser: 0.68 mg/dL (ref 0.44–1.00)
GFR, Estimated: >60 mL/min (ref >60)
Glucose, Bld: 165 mg/dL — ABNORMAL HIGH (ref 70–99)
Potassium: 3.6 mmol/L (ref 3.5–5.1)
Sodium: 139 mmol/L (ref 135–145)

## 2022-11-19 LAB — CBC
HCT: 40.6 % (ref 36.0–46.0)
Hemoglobin: 13.4 g/dL (ref 12.0–15.0)
MCH: 28.8 pg (ref 26.0–34.0)
MCHC: 33 g/dL (ref 30.0–36.0)
MCV: 87.1 fL (ref 80.0–100.0)
Platelets: 291 10*3/uL (ref 150–400)
RBC: 4.66 MIL/uL (ref 3.87–5.11)
RDW: 13.6 % (ref 11.5–15.5)
WBC: 10.4 10*3/uL (ref 4.0–10.5)
nRBC: 0 % (ref 0.0–0.2)

## 2022-11-19 LAB — SARS CORONAVIRUS 2 BY RT PCR: SARS Coronavirus 2 by RT PCR: NEGATIVE

## 2022-11-19 LAB — D-DIMER, QUANTITATIVE: D-Dimer, Quant: 0.42 ug/mL-FEU (ref 0.00–0.50)

## 2022-11-19 LAB — TROPONIN I (HIGH SENSITIVITY): Troponin I (High Sensitivity): 2 ng/L (ref <18)

## 2022-11-19 MED ORDER — PREDNISONE 10 MG PO TABS: ORAL_TABLET | ORAL | 0 refills | Status: AC

## 2022-11-19 MED ORDER — PREDNISONE 20 MG PO TABS
60.0000 mg | ORAL_TABLET | Freq: Once | ORAL | Status: AC
  Administered 2022-11-19: 60 mg via ORAL
  Filled 2022-11-19: qty 3

## 2022-11-19 MED ORDER — IPRATROPIUM-ALBUTEROL 0.5-2.5 (3) MG/3ML IN SOLN
3.0000 mL | Freq: Once | RESPIRATORY_TRACT | Status: AC
  Administered 2022-11-19: 3 mL via RESPIRATORY_TRACT
  Filled 2022-11-19: qty 3

## 2022-11-19 NOTE — ED Provider Notes (Signed)
[facility] Provider Note    Event Date/Time   First MD Initiated Contact with Patient 11/19/22 1137     (approximate)   History   Chief Complaint Shortness of Breath   HPI  Tammy Sosa is a 52 y.o. female with past medical history of stroke, PFO, migraines, asthma, and GERD who presents to the ED complaining of shortness of breath.  Patient reports that she has had about 2 weeks of cough productive of clear sputum along with subjective fevers.  She reports she is having some pain in both sides of her chest when she coughs, also reports getting very out of breath easily with any exertion.  She does not have any pain in her chest at rest and has not noticed any pain or swelling in her legs.  She was seen at urgent care for the symptoms a few days ago, prescribed a course of steroids along with doxycycline, but has not had any improvement in her symptoms.  She has been using her albuterol inhaler at home without significant relief.     Physical Exam   Triage Vital Signs: ED Triage Vitals  Enc Vitals Group     BP 11/19/22 1126 (!) 123/92     Pulse Rate 11/19/22 1126 86     Resp 11/19/22 1126 18     Temp 11/19/22 1126 98.2 F (36.8 C)     Temp Source 11/19/22 1126 Oral     SpO2 11/19/22 1126 98 %     Weight 11/19/22 1123 200 lb (90.7 kg)     Height 11/19/22 1123  (1.6 m)     Head Circumference --      Peak Flow --      Pain Score 11/19/22 1123 6     Pain Loc --      Pain Edu? --      Excl. in GC? --     Most recent vital signs: Vitals:   11/19/22 1126  BP: (!) 123/92  Pulse: 86  Resp: 18  Temp: 98.2 F (36.8 C)  SpO2: 98%    Constitutional: Alert and oriented. Eyes: Conjunctivae are normal. Head: Atraumatic. Nose: No congestion/rhinnorhea. Mouth/Throat: Mucous membranes are moist.  Cardiovascular: Normal rate, regular rhythm. Grossly normal heart sounds.  2+ radial pulses bilaterally. Respiratory: Normal respiratory effort.  No  retractions. Lungs with mild end expiratory wheezing bilaterally. Gastrointestinal: Soft and nontender. No distention. Musculoskeletal: No lower extremity tenderness nor edema.  Neurologic:  Normal speech and language. No gross focal neurologic deficits are appreciated.    ED Results / Procedures / Treatments   Labs (all labs ordered are listed, but only abnormal results are displayed) Labs Reviewed  BASIC METABOLIC PANEL - Abnormal; Notable for the following components:      Result Value   Glucose, Bld 165 (*)    All other components within normal limits  SARS CORONAVIRUS 2 BY RT PCR  CBC  D-DIMER, QUANTITATIVE  POC URINE PREG, ED  TROPONIN I (HIGH SENSITIVITY)     EKG  ED ECG REPORT I, Chesley Noon, the attending physician, personally viewed and interpreted this ECG.   Date: 11/19/2022  EKG Time: 11:30  Rate: 88  Rhythm: normal sinus rhythm  Axis: Normal  Intervals:none  ST&T Change: None  RADIOLOGY Chest x-ray reviewed and interpreted by me with no infiltrate, edema, or effusion.  PROCEDURES:  Critical Care performed: No  Procedures   MEDICATIONS ORDERED IN ED: Medications  ipratropium-albuterol (DUONEB) 0.5-2.5 (  3) MG/3ML nebulizer solution 3 mL (3 mLs Nebulization Given 11/19/22 1241)  predniSONE (DELTASONE) tablet 60 mg (60 mg Oral Given 11/19/22 1239)     IMPRESSION / MDM / ASSESSMENT AND PLAN / ED COURSE  I reviewed the triage vital signs and the nursing notes.                              52 y.o. female with past medical history of stroke, PFO, migraines, asthma, and GERD who presents to the ED with 2 weeks of persistent productive cough with shortness of breath and chest soreness while coughing.  Patient's presentation is most consistent with acute presentation with potential threat to life or bodily function.  Differential diagnosis includes, but is not limited to, pneumonia, pneumothorax, asthma exacerbation, bronchitis, COVID-19, ACS,  PE.  Patient nontoxic-appearing and in no acute distress, vital signs are unremarkable.  EKG shows no evidence of arrhythmia or ischemia and troponin within normal limits, doubt ACS given atypical symptoms.  D-dimer within normal limits and patient is low risk by Wells criteria, doubt PE.  She does have some mild end expiratory wheezing, chest x-ray is unremarkable, symptoms consistent with viral bronchitis contributing to asthma exacerbation.  We will give DuoNeb and dose of prednisone, reassess.  Additional labs are reassuring with no significant anemia, leukocytosis, lecture abnormality, or AKI.  Patient reports feeling better following DuoNeb and dose of prednisone, is appropriate for outpatient management as she continues to maintain oxygen saturations on room air, wheezing resolved on reassessment.  We will lengthen the initial steroid taper prescribed by urgent care, patient also advised to obtain spacer for inhaler.  She was counseled to follow-up with her PCP and to return to the ED for new or worsening symptoms, patient agrees with plan.      FINAL CLINICAL IMPRESSION(S) / ED DIAGNOSES   Final diagnoses:  Bronchitis  Exacerbation of asthma, unspecified asthma severity, unspecified whether persistent     Rx / DC Orders   ED Discharge Orders          Ordered    predniSONE (DELTASONE) 10 MG tablet  Daily        11/19/22 1325             Note:  This document was prepared using Dragon voice recognition software and may include unintentional dictation errors.   Chesley Noon, MD 11/19/22 1326

## 2022-11-19 NOTE — ED Triage Notes (Signed)
Pt via POV from home. Pt c/o SOB, chest pain, and productive cough for the past 2 weeks. Pt has a hx of asthma. States that she went to [facility] on Tuesday and dx with bronchitis and prescribed medication that she has been taking but states she feels no relief. Pt is A&OX4 and NAD

## 2022-11-22 NOTE — Telephone Encounter (Signed)
Noted, appears pt did go to ED

## 2022-11-24 ENCOUNTER — Ambulatory Visit: Payer: Self-pay

## 2022-11-24 NOTE — Telephone Encounter (Signed)
    Chief Complaint: Productive cough with green mucus. Seen in UC 11/16/22, ED 11/19/22. Wheezing Symptoms: Above Frequency: 3 weeks Pertinent Negatives: Patient denies fever Disposition: ED /[] Urgent Care (no appt availability in office) / Appointment(In office/virtual)/  [place] [facility]/ Home Care/ Refused Recommended Disposition /[] [place] [facility]/  Follow-up with PCP Additional Notes: Go to ED for worsening of symptoms.   Reason for Disposition  [1] Continuous (nonstop) coughing interferes with work or school AND [2] no improvement using cough treatment per Care Advice  Answer Assessment - Initial Assessment Questions 1. ONSET: "When did the cough begin?"      3 Weeks ago 2. SEVERITY: "How bad is the cough today?"      Severe 3. SPUTUM: "Describe the color of your sputum" (none, dry cough; clear, white, yellow, green)     Green 4. HEMOPTYSIS: "Are you coughing up any blood?" If so ask: "How much?" (flecks, streaks, tablespoons, etc.)     No 5. DIFFICULTY BREATHING: "Are you having difficulty breathing?" If Yes, ask: "How bad is it?" (e.g., mild, moderate, severe)    - MILD: No SOB at rest, mild SOB with walking, speaks normally in sentences, can lie down, no retractions, pulse < 100.    - MODERATE: SOB at rest, SOB with minimal exertion and prefers to sit, cannot lie down flat, speaks in phrases, mild retractions, audible wheezing, pulse 100-120.    - SEVERE: Very SOB at rest, speaks in single words, struggling to breathe, sitting hunched forward, retractions, pulse > 120      With coughing spells 6. FEVER: "Do you have a fever?" If Yes, ask: "What is your temperature, how was it measured, and when did it start?"     No 7. CARDIAC HISTORY: "Do you have any history of heart disease?" (e.g., heart attack, congestive heart failure)      No 8. LUNG HISTORY: "Do you have any history of lung disease?"  (e.g., pulmonary embolus, asthma, emphysema)     No 9. PE  RISK FACTORS: "Do you have a history of blood clots?" (or: recent major surgery, recent prolonged travel, bedridden)     No 10. OTHER SYMPTOMS: "Do you have any other symptoms?" (e.g., runny nose, wheezing, chest pain)       Wheezing 11. PREGNANCY: "Is there any chance you are pregnant?" "When was your last menstrual period?"       No 12. TRAVEL: "Have you traveled out of the country in the last month?" (e.g., travel history, exposures)       No  Protocols used: Cough - Acute Productive-A-AH

## 2022-11-25 ENCOUNTER — Ambulatory Visit (INDEPENDENT_AMBULATORY_CARE_PROVIDER_SITE_OTHER): Payer: BC Managed Care – PPO | Admitting: Family Medicine

## 2022-11-25 ENCOUNTER — Encounter: Payer: Self-pay | Admitting: Family Medicine

## 2022-11-25 VITALS — BP 106/61 | HR 70 | Temp 98.4°F | Resp 15 | Ht 63.0 in | Wt 201.3 lb

## 2022-11-25 DIAGNOSIS — J0110 Acute frontal sinusitis, unspecified: Secondary | ICD-10-CM

## 2022-11-25 DIAGNOSIS — J4551 Severe persistent asthma with (acute) exacerbation: Secondary | ICD-10-CM

## 2022-11-25 MED ORDER — MONTELUKAST SODIUM 10 MG PO TABS
10.0000 mg | ORAL_TABLET | Freq: Every day | ORAL | 3 refills | Status: DC
Start: 2022-11-25 — End: 2023-02-21

## 2022-11-25 MED ORDER — OMEPRAZOLE 40 MG PO CPDR: 40.0000 mg | DELAYED_RELEASE_CAPSULE | Freq: Every day | ORAL | 3 refills | Status: DC

## 2022-11-25 MED ORDER — OXYBUTYNIN CHLORIDE 5 MG PO TABS
5.0000 mg | ORAL_TABLET | Freq: Three times a day (TID) | ORAL | 0 refills | Status: DC
Start: 2022-11-25 — End: 2022-12-19

## 2022-11-25 MED ORDER — TRELEGY ELLIPTA 200-62.5-25 MCG/ACT IN AEPB
1.0000 | INHALATION_SPRAY | Freq: Every day | RESPIRATORY_TRACT | 0 refills | Status: DC
Start: 2022-11-25 — End: 2023-01-11

## 2022-11-25 MED ORDER — AZELASTINE HCL 0.1 % NA SOLN
2.0000 | Freq: Two times a day (BID) | NASAL | 12 refills | Status: DC
Start: 2022-11-25 — End: 2023-10-02

## 2022-11-25 NOTE — Progress Notes (Unsigned)
I,Tammy Sosa,acting as a scribe for Tammy Kindle, FNP.,have documented all relevant documentation on the behalf of Tammy Kindle, FNP,as directed by  Tammy Kindle, FNP while in the presence of Tammy Kindle, FNP.   Established patient visit   Patient: Tammy Sosa   DOB: [birth year]   52 y.o. Female  MRN: [id number] Visit Date: 11/25/2022  Today's healthcare provider: [name], FNP   Chief Complaint  Patient presents with   Cough   Subjective    HPI  Cough: Patient complains of productive cough with sputum described as white and yellow.  Symptoms began 3 weeks ago. The cough worsened over the course of the last week. The cough is productive of clear sputum, with wheezing, with shortness of breath, with shortness of breath during the cough, chest is painful during coughing, harsh, worsening over time and is aggravated by cold air, dust, fumes, and pollens Associated symptoms include:change in voice, chest pain, shortness of breath, sputum production, and wheezing. Patient does have new pets. Patient does have a history of asthma. Patient does have a history of environmental allergens. Patient has not recent travel. Patient do not have a history of smoking. Patient  has previous Chest X-ray. Patient has had a PPD done.  Patient also complains of of bladder incontinence. She reports that she has to wear an adult diaper due to the severity of the cough. She expressed that she has not been able to work.   She complains of a rash on her back as a result of wearing the adult diapers.   Medications: Outpatient Medications Prior to Visit  Medication Sig Note   albuterol (VENTOLIN HFA) 108 (90 Base) MCG/ACT inhaler Inhale 2 puffs into the lungs every 6 (six) hours as needed for wheezing or shortness of breath.    aspirin EC 81 MG tablet Take 81 mg by mouth.    benzonatate (TESSALON) 200 MG capsule Take 200 mg by mouth 3 (three) times daily as needed for cough.    levonorgestrel  (MIRENA) 20 MCG/24HR IUD 1 each by Intrauterine route once.    nitrofurantoin, macrocrystal-monohydrate, (MACROBID) 100 MG capsule Take 1 capsule (100 mg total) by mouth 2 (two) times daily.    nystatin (MYCOSTATIN/NYSTOP) powder Apply 1 Application topically 3 (three) times daily.    predniSONE (DELTASONE) 10 MG tablet Take 6 tablets (60 mg total) by mouth daily for 2 days, THEN 5 tablets (50 mg total) daily for 2 days, THEN 4 tablets (40 mg total) daily for 2 days, THEN 3 tablets (30 mg total) daily for 2 days, THEN 2 tablets (20 mg total) daily for 2 days, THEN 1 tablet (10 mg total) daily for 2 days. 11/25/2022: Last dose taken last night    promethazine-dextromethorphan (PROMETHAZINE-DM) 6.25-15 MG/5ML syrup Take 1.25 mLs by mouth 4 (four) times daily as needed for cough (Only takes at night).    triamcinolone cream (KENALOG) 0.1 % Apply 1 Application topically 2 (two) times daily.    atorvastatin (LIPITOR) 10 MG tablet Take 1 tablet (10 mg total) by mouth daily. (Patient not taking: Reported on 04/27/2022)    buPROPion (WELLBUTRIN XL) 150 MG 24 hr tablet TAKE 1 TABLET BY MOUTH EVERY DAY (Patient not taking: Reported on 09/02/2022)    fluconazole (DIFLUCAN) 150 MG tablet Take 1 tablet by mouth; if symptoms remain- take 2nd tablet for yeast (Patient not taking: Reported on 09/02/2022)    meloxicam (MOBIC) 15 MG tablet TAKE 1  TABLET BY MOUTH EVERY DAY AS NEEDED FOR PAIN (Patient not taking: Reported on 09/02/2022)    [DISCONTINUED] Azelastine HCl 137 MCG/SPRAY SOLN PLACE 2 SPRAYS INTO BOTH NOSTRILS 2 (TWO) TIMES DAILY. USE IN EACH NOSTRIL AS DIRECTED (Patient not taking: Reported on 04/27/2022)    [DISCONTINUED] esomeprazole (NEXIUM) 40 MG capsule Take first thing in the morning on an empty stomach (Patient not taking: Reported on 09/02/2022)    No facility-administered medications prior to visit.    Review of Systems    Objective    BP 106/61 (BP Location: Left Arm, Patient Position: Sitting, Cuff  Size: Large)   Pulse 70   Temp 98.4 F (36.9 C) (Oral)   Resp 15   Ht 5\' 3"  (1.6 m)   Wt 201 lb 4.8 oz (91.3 kg)   SpO2 95%   BMI 35.66 kg/m   Physical Exam Vitals and nursing note reviewed.  Constitutional:      General: She is not in acute distress.    Appearance: Normal appearance. She is obese. She is not ill-appearing, toxic-appearing or diaphoretic.  HENT:     Head: Normocephalic and atraumatic.     Right Ear: Tympanic membrane, ear canal and external ear normal.     Nose: Congestion present.     Mouth/Throat:     Mouth: Mucous membranes are moist.     Pharynx: Oropharynx is clear. No posterior oropharyngeal erythema.  Eyes:     Extraocular Movements: Extraocular movements intact.     Conjunctiva/sclera: Conjunctivae normal.     Pupils: Pupils are equal, round, and reactive to light.  Cardiovascular:     Rate and Rhythm: Normal rate and regular rhythm.     Pulses: Normal pulses.     Heart sounds: Normal heart sounds. No murmur heard.    No friction rub. No gallop.  Pulmonary:     Effort: Pulmonary effort is normal. No respiratory distress.     Breath sounds: Decreased air movement present. No stridor. Decreased breath sounds and wheezing present. No rhonchi or rales.  Chest:     Chest wall: Tenderness present.  Abdominal:     General: Bowel sounds are normal.     Palpations: Abdomen is soft.  Musculoskeletal:        General: No swelling, tenderness, deformity or signs of injury. Normal range of motion.     Cervical back: Neck supple. No tenderness.     Right lower leg: No edema.     Left lower leg: No edema.  Skin:    General: Skin is warm and dry.     Capillary Refill: Capillary refill takes less than 2 seconds.     Coloration: Skin is not jaundiced or pale.     Findings: No bruising, erythema, lesion or rash.  Neurological:     General: No focal deficit present.     Mental Status: She is alert and oriented to person, place, and time. Mental status is at  baseline.     Cranial Nerves: No cranial nerve deficit.     Sensory: No sensory deficit.     Motor: No weakness.     Coordination: Coordination normal.  Psychiatric:        Mood and Affect: Mood normal.        Behavior: Behavior normal.        Thought Content: Thought content normal.        Judgment: Judgment normal.      No results found for any visits on 11/25/22.  Assessment & Plan     Problem List Items Addressed This Visit       Respiratory   Acute non-recurrent frontal sinusitis    Recommend nasal spray and singulair start to assist Continue otc antihistamine Continue to recommend masking as needed and PM baths to remove allergens from skin/hair      Relevant Medications   benzonatate (TESSALON) 200 MG capsule   promethazine-dextromethorphan (PROMETHAZINE-DM) 6.25-15 MG/5ML syrup   azelastine (ASTELIN) 0.1 % nasal spray   Severe persistent asthma with exacerbation - Primary    Acute, severe, with exacerbation Recommend deferred steroid given previously completed x2 rounds Start inhaler to assist; referrals placed      Relevant Medications   oxybutynin (DITROPAN) 5 MG tablet   Fluticasone-Umeclidin-Vilant (TRELEGY ELLIPTA) 200-62.5-25 MCG/ACT AEPB   montelukast (SINGULAIR) 10 MG tablet   azelastine (ASTELIN) 0.1 % nasal spray   Other Relevant Orders   Ambulatory referral to Pulmonology   Ambulatory referral to Cardiology   Ambulatory referral to Allergy   Recommendation for speciality care to rule out worsening PFO and concern for asthma and acute allergies which are exacerbating current condition.   No follow-ups on file.      Leilani Merl, FNP, have reviewed all documentation for this visit. The documentation on 11/27/22 for the exam, diagnosis, procedures, and orders are all accurate and complete.    Tammy Kindle, FNP  [facility] [phone] (phone) [phone] (fax)  [facility]

## 2022-11-27 DIAGNOSIS — J4520 Mild intermittent asthma, uncomplicated: Secondary | ICD-10-CM | POA: Insufficient documentation

## 2022-11-27 DIAGNOSIS — J4551 Severe persistent asthma with (acute) exacerbation: Secondary | ICD-10-CM | POA: Insufficient documentation

## 2022-11-27 DIAGNOSIS — J0110 Acute frontal sinusitis, unspecified: Secondary | ICD-10-CM | POA: Insufficient documentation

## 2022-11-27 NOTE — Assessment & Plan Note (Signed)
Acute, severe, with exacerbation Recommend deferred steroid given previously completed x2 rounds Start inhaler to assist; referrals placed

## 2022-11-27 NOTE — Assessment & Plan Note (Signed)
Recommend nasal spray and singulair start to assist Continue otc antihistamine Continue to recommend masking as needed and PM baths to remove allergens from skin/hair

## 2022-11-28 ENCOUNTER — Ambulatory Visit: Payer: Self-pay

## 2022-11-28 ENCOUNTER — Telehealth: Payer: Self-pay | Admitting: Physician Assistant

## 2022-11-28 ENCOUNTER — Other Ambulatory Visit: Payer: Self-pay | Admitting: Family Medicine

## 2022-11-28 DIAGNOSIS — J4551 Severe persistent asthma with (acute) exacerbation: Secondary | ICD-10-CM

## 2022-11-28 MED ORDER — TRELEGY ELLIPTA 200-62.5-25 MCG/ACT IN AEPB: 1.0000 | INHALATION_SPRAY | Freq: Every day | RESPIRATORY_TRACT | 11 refills | Status: DC

## 2022-11-28 NOTE — Telephone Encounter (Signed)
Patient stated to [facility] that she received a sample of Trellogy inhaler last week when she was here but now would like to get the prescription

## 2022-11-28 NOTE — Telephone Encounter (Signed)
      Chief Complaint: Pt. At work without her Trellegy inhaler, asking for it to be called in to [employer] "at [employer]." "I'm in a meeting and I have to go back." Unable to locate pharmacy.Called pt. Back and left message to call back, more information needed about pharmacy. Symptoms: Bronchitis. Frequency: Seen in office Pertinent Negatives: Patient denies  Disposition: [] ED /[] Urgent Care (no appt availability in office) / [] Appointment(In office/virtual)/ []  [facility]/ [] Home Care/ [] Refused Recommended Disposition /[] [place] [facility]/ [x]  Follow-up with PCP Additional Notes: Left message to call back with more information on pharmacy. Spoke wit [place] in the practice. Answer Assessment - Initial Assessment Questions 1. DRUG NAME: "What medicine do you need to have refilled?"     Trellagy  2. REFILLS REMAINING: "How many refills are remaining?" (Note: The label on the medicine or pill bottle will show how many refills are remaining. If there are no refills remaining, then a renewal may be needed.)     N/a 3. EXPIRATION DATE: "What is the expiration date?" (Note: The label states when the prescription will expire, and thus can no longer be refilled.)     N/a 4. PRESCRIBING HCP: "Who prescribed it?" Reason: If prescribed by specialist, call should be referred to that group.      5. SYMPTOMS: "Do you have any symptoms?"     N/a 6. PREGNANCY: "Is there any chance that you are pregnant?" "When was your last menstrual period?"     No  Protocols used: Medication Refill and Renewal Call-A-AH

## 2022-12-17 ENCOUNTER — Other Ambulatory Visit: Payer: Self-pay | Admitting: Family Medicine

## 2022-12-17 DIAGNOSIS — J4551 Severe persistent asthma with (acute) exacerbation: Secondary | ICD-10-CM

## 2022-12-19 NOTE — Telephone Encounter (Signed)
Requested Prescriptions  Pending Prescriptions Disp Refills   oxybutynin (DITROPAN) 5 MG tablet [Pharmacy Med Name: OXYBUTYNIN 5 MG TABLET] 270 tablet 1    Sig: TAKE 1 TABLET BY MOUTH THREE TIMES A DAY     Urology:  Bladder Agents Passed - 12/17/2022 12:32 PM      Passed - Valid encounter within last 12 months    Recent Outpatient Visits           3 weeks ago Severe persistent asthma with exacerbation   [facility] [name], FNP   3 months ago Dysuria   [facility] [place], [place], PA-C   7 months ago Yeast infection involving the vagina and surrounding area   [facility] [name], FNP   1 year ago Diarrhea, unspecified type   [facility] Alfredia Ferguson, PA-C   1 year ago Acute non-recurrent frontal sinusitis   [facility] Alfredia Ferguson, [place]

## 2023-01-11 ENCOUNTER — Encounter: Payer: Self-pay | Admitting: Student in an Organized Health Care Education/Training Program

## 2023-01-11 ENCOUNTER — Ambulatory Visit (INDEPENDENT_AMBULATORY_CARE_PROVIDER_SITE_OTHER): Payer: BC Managed Care – PPO | Admitting: Student in an Organized Health Care Education/Training Program

## 2023-01-11 VITALS — BP 112/64 | HR 72 | Temp 97.3°F | Ht 63.0 in | Wt 201.2 lb

## 2023-01-11 DIAGNOSIS — R0602 Shortness of breath: Secondary | ICD-10-CM

## 2023-01-11 DIAGNOSIS — J4520 Mild intermittent asthma, uncomplicated: Secondary | ICD-10-CM | POA: Diagnosis not present

## 2023-01-11 MED ORDER — BUDESONIDE-FORMOTEROL FUMARATE 160-4.5 MCG/ACT IN AERO
2.0000 | INHALATION_SPRAY | Freq: Two times a day (BID) | RESPIRATORY_TRACT | 12 refills | Status: DC
Start: 2023-01-11 — End: 2023-10-31

## 2023-01-11 NOTE — Progress Notes (Signed)
Synopsis: Referred in for Asthma by [name], FNP  Assessment & Plan:   1. Shortness of breath 2. Mild intermittent asthma without complication  Patient has a history of asthma and symptoms are consistent with this diagnosis.  She seems to have gotten better with Trelegy though I do not suspect she requires a long-acting antimuscarinic agent in her regimen.  Will step-down her therapy to Symbicort 160-4.5 two puffs twice daily for control of her asthma.  I will also obtain a pulmonary function test to assess her spirometry, lung volumes, and DLCO.  Furthermore, will obtain an allergen panel with IgE and a CBC with differential to assess for her T-helper cell type response and assess for any environmental exposures that could be triggering her asthma.  Patient was counseled to wash her mouth after every use of the Symbicort.  I also encouraged her to use her Flonase 1 actuation in each nostril once daily.  - Pulmonary Function Test [facility] Only; Future - budesonide-formoterol (SYMBICORT) 160-4.5 MCG/ACT inhaler; Inhale 2 puffs into the lungs in the morning and at bedtime.  Dispense: 1 each; Refill: 12 - Allergen Panel (27) + IGE - CBC w/Diff   Return in about 3 months (around 04/13/2023).  I spent 45 minutes caring for this patient today, including preparing to see the patient, obtaining a medical history , reviewing a separately obtained history, performing a medically appropriate examination and/or evaluation, counseling and educating the patient/family/caregiver, ordering medications, tests, or procedures, and documenting clinical information in the electronic health record  Raechel Chute, MD [facility] 01/11/2023 9:32 AM    End of visit medications:  Meds ordered this encounter  Medications   budesonide-formoterol (SYMBICORT) 160-4.5 MCG/ACT inhaler    Sig: Inhale 2 puffs into the lungs in the morning and at bedtime.    Dispense:  1 each    Refill:  12      Current Outpatient Medications:    albuterol (VENTOLIN HFA) 108 (90 Base) MCG/ACT inhaler, Inhale 2 puffs into the lungs every 6 (six) hours as needed for wheezing or shortness of breath., Disp: 8 g, Rfl: 2   budesonide-formoterol (SYMBICORT) 160-4.5 MCG/ACT inhaler, Inhale 2 puffs into the lungs in the morning and at bedtime., Disp: 1 each, Rfl: 12   buPROPion (WELLBUTRIN XL) 150 MG 24 hr tablet, TAKE 1 TABLET BY MOUTH EVERY DAY, Disp: 90 tablet, Rfl: 2   levonorgestrel (MIRENA) 20 MCG/24HR IUD, 1 each by Intrauterine route once., Disp: , Rfl:    omeprazole (PRILOSEC) 40 MG capsule, Take 1 capsule (40 mg total) by mouth daily., Disp: 30 capsule, Rfl: 3   aspirin EC 81 MG tablet, Take 81 mg by mouth. (Patient not taking: Reported on 01/11/2023), Disp: , Rfl:    atorvastatin (LIPITOR) 10 MG tablet, Take 1 tablet (10 mg total) by mouth daily. (Patient not taking: Reported on 04/27/2022), Disp: 90 tablet, Rfl: 3   azelastine (ASTELIN) 0.1 % nasal spray, Place 2 sprays into both nostrils 2 (two) times daily. Use in each nostril as directed (Patient not taking: Reported on 01/11/2023), Disp: 30 mL, Rfl: 12   benzonatate (TESSALON) 200 MG capsule, Take 200 mg by mouth 3 (three) times daily as needed for cough. (Patient not taking: Reported on 01/11/2023), Disp: , Rfl:    fluconazole (DIFLUCAN) 150 MG tablet, Take 1 tablet by mouth; if symptoms remain- take 2nd tablet for yeast (Patient not taking: Reported on 09/02/2022), Disp: 2 tablet, Rfl: 0   meloxicam (MOBIC)  15 MG tablet, TAKE 1 TABLET BY MOUTH EVERY DAY AS NEEDED FOR PAIN (Patient not taking: Reported on 09/02/2022), Disp: 90 tablet, Rfl: 3   montelukast (SINGULAIR) 10 MG tablet, Take 1 tablet (10 mg total) by mouth at bedtime. (Patient not taking: Reported on 01/11/2023), Disp: 30 tablet, Rfl: 3   nitrofurantoin, macrocrystal-monohydrate, (MACROBID) 100 MG capsule, Take 1 capsule (100 mg total) by mouth 2 (two) times daily. (Patient not taking:  Reported on 01/11/2023), Disp: 14 capsule, Rfl: 0   nystatin (MYCOSTATIN/NYSTOP) powder, Apply 1 Application topically 3 (three) times daily. (Patient not taking: Reported on 01/11/2023), Disp: 15 g, Rfl: 0   oxybutynin (DITROPAN) 5 MG tablet, TAKE 1 TABLET BY MOUTH THREE TIMES A DAY (Patient not taking: Reported on 01/11/2023), Disp: 270 tablet, Rfl: 1   promethazine-dextromethorphan (PROMETHAZINE-DM) 6.25-15 MG/5ML syrup, Take 1.25 mLs by mouth 4 (four) times daily as needed for cough (Only takes at night). (Patient not taking: Reported on 01/11/2023), Disp: , Rfl:    triamcinolone cream (KENALOG) 0.1 %, Apply 1 Application topically 2 (two) times daily. (Patient not taking: Reported on 01/11/2023), Disp: 30 g, Rfl: 0   Subjective:   PATIENT ID: Tammy Sosa GENDER: female DOB: 03/01/1971, MRN: [id number]  Chief Complaint  Patient presents with   Consult    Was told she had asthma as a child. Urgent Care on 4/17 for Bronchitis. Not feeling better and went to the ER on 11/19/2022. Still having DOE. Dry Cough. Constant nasal drip. No wheezing.    HPI  Patient is a 52 year old female with a past medical history of asthma who is presenting to clinic for the evaluation of shortness of breath and wheezing.  Patient reports having had asthma that started at the age of 31 which she feels she outgrew in her mid 45s.  This has not been an issue up until last April when she developed shortness of breath and wheezing. She feels that this year's pollen season was particularly bad.  She was seen in the ED and by her primary care provider and was prescribed Trelegy for asthma. Her symptoms of shortness of breath and wheezing have gotten better but the Trelegy has been causing her to cough.  She also reports a running nose. She has not had any fevers, chills, night sweats, chest pain, or chest tightness.  In the past, she was on inhalers for asthma which she stopped requiring in her 35s.  She was born in [place]  and moved to [place] in 1989.  While living in [place], she describes shortness of breath and chest tightness when exposed to cold air.  She also reports a history of epistaxis as a child.  Patient is a non-smoker, and denies any vaping.  She lives in a home that does not have a crawl space or a basement.  She denies any water damage to the house.  She does feel that where she works there is some humidity and does report her symptoms get worse while at work.  She has dogs, cats, and a pet lizard at home.  Ancillary information including prior medications, full medical/surgical/family/social histories, and PFTs (when available) are listed below and have been reviewed.   Review of Systems  Constitutional:  Negative for chills, fever, malaise/fatigue and weight loss.  Respiratory:  Positive for cough, shortness of breath and wheezing.   Cardiovascular:  Negative for chest pain.     Objective:   Vitals:   01/11/23 0914  BP: 112/64  Pulse:  72  Temp: (!) 97.3 F (36.3 C)  SpO2: 100%  Weight: 201 lb 3.2 oz (91.3 kg)  Height: 5\' 3"  (1.6 m)   100% on RA  BMI Readings from Last 3 Encounters:  01/11/23 35.64 kg/m  11/25/22 35.66 kg/m  11/19/22 35.43 kg/m   Wt Readings from Last 3 Encounters:  01/11/23 201 lb 3.2 oz (91.3 kg)  11/25/22 201 lb 4.8 oz (91.3 kg)  11/19/22 200 lb (90.7 kg)    Physical Exam Constitutional:      Appearance: Normal appearance. She is obese.  HENT:     Mouth/Throat:     Mouth: Mucous membranes are moist.  Cardiovascular:     Rate and Rhythm: Normal rate and regular rhythm.     Pulses: Normal pulses.     Heart sounds: Normal heart sounds.  Pulmonary:     Effort: Pulmonary effort is normal. No respiratory distress.     Breath sounds: Normal breath sounds. No wheezing, rhonchi or rales.  Neurological:     General: No focal deficit present.     Mental Status: She is alert and oriented to person, place, and time. Mental status is at  baseline.     Ancillary Information    Past Medical History:  Diagnosis Date   Abnormal Papanicolaou smear of cervix with positive human papilloma virus (HPV) test 12/11/2014   LSIL/Mild dysplasia   Arthritis    hips   Asthma    as young adult   Atypical chest pain    a. 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 08/2015 TEE:EF 60%, no rwma, large PFO w/ R->L shunt; b. 05/2019 Echo: EF 60-65%, nl RV size/fxn, nl PASP. Neg bubble study.   Plantar fasciitis    Stroke [facility]) 2017   a. 08/2015 in setting of PFO-->ASA 81mg  daily.   Wears contact lenses      Family History  Problem Relation Age of Onset   Breast cancer Mother 63   Ovarian cancer Mother 65   Diabetes Mother    COPD Mother    Hypertension Mother    Skin cancer Father    Stomach cancer Paternal Uncle    Stomach cancer Maternal Grandfather    Diabetes Paternal Grandmother      Past Surgical History:  Procedure Laterality Date   CERVICAL CONIZATION W/BX     COLONOSCOPY WITH PROPOFOL N/A 06/20/2018   Procedure: COLONOSCOPY WITH PROPOFOL;  Surgeon: Toney Reil, MD;  [facility];  Service: Endoscopy;  Laterality: N/A;   ESOPHAGOGASTRODUODENOSCOPY (EGD) WITH PROPOFOL N/A 06/20/2018   Procedure: ESOPHAGOGASTRODUODENOSCOPY (EGD) WITH PROPOFOL;  Surgeon: Toney Reil, MD;  Location: [facility];  Service: Endoscopy;  Laterality: N/A;   INTRAUTERINE DEVICE (IUD) INSERTION  11/25/2015   Mirena   KNEE  SURGERY Right     Social History   Socioeconomic History   Marital status:  Married    Spouse name: Not on file   Number of children: Not on file   Years of education: Not on file   Highest education level: Not on file  Occupational History   Not on file  Tobacco Use   Smoking status: Never   Smokeless tobacco: Never  Vaping Use   Vaping Use: Never used  Substance and Sexual Activity   Alcohol use: Not Currently    Alcohol/week: 4.0 standard drinks of alcohol    Types: 1 Glasses of wine, 1 Cans of beer, 2 Shots of liquor per week   Drug use: No   Sexual activity: Yes    Birth control/protection: I.U.D.  Other Topics Concern   Not on file  Social History Narrative   Not on file   Social Determinants of Health   Financial Resource Strain: Not on file  Food Insecurity: Not on file  Transportation Needs: Not on file  Physical Activity: Not on file  Stress: Not on file  Social Connections: Not on file  Intimate Partner Violence: Not on file     No Known Allergies   CBC    Component Value Date/Time   WBC 10.4 11/19/2022 1128   RBC 4.66 11/19/2022 1128   HGB 13.4 11/19/2022 1128   HGB 13.9 10/29/2021 1420   HCT 40.6 11/19/2022 1128   HCT 40.4 10/29/2021 1420   PLT 291 11/19/2022 1128   PLT 278 10/29/2021 1420   MCV 87.1 11/19/2022 1128   MCV 85 10/29/2021 1420   MCV 87 05/06/2013 2122   MCH 28.8 11/19/2022 1128   MCHC 33.0 11/19/2022 1128   RDW 13.6 11/19/2022 1128   RDW 13.2 10/29/2021 1420   RDW 15.0 (H) 05/06/2013 2122   LYMPHSABS 2.6 10/29/2021 1420   LYMPHSABS 3.0 05/06/2013 2122   MONOABS 0.4 08/22/2015 1338   MONOABS 0.9 05/06/2013 2122   EOSABS 0.2 10/29/2021 1420   EOSABS 0.1 05/06/2013 2122   BASOSABS 0.1 10/29/2021 1420   BASOSABS 0.1 05/06/2013 2122    Pulmonary Functions Testing Results:     No data to display          Outpatient Medications Prior to Visit  Medication Sig Dispense Refill   albuterol (VENTOLIN HFA) 108 (90 Base)  MCG/ACT inhaler Inhale 2 puffs into the lungs every 6 (six) hours as needed for wheezing or shortness of breath. 8 g 2   buPROPion (WELLBUTRIN XL) 150 MG 24 hr tablet TAKE 1 TABLET BY MOUTH EVERY DAY 90 tablet 2   levonorgestrel (MIRENA) 20 MCG/24HR IUD 1 each by Intrauterine route once.     omeprazole (PRILOSEC) 40 MG capsule Take 1 capsule (40 mg total) by mouth daily. 30 capsule 3   Fluticasone-Umeclidin-Vilant (TRELEGY ELLIPTA) 200-62.5-25 MCG/ACT AEPB Inhale 1 each into the lungs daily. 60 each 11   aspirin EC 81 MG tablet Take 81 mg by mouth. (Patient not taking: Reported on 01/11/2023)     atorvastatin (LIPITOR) 10 MG tablet Take 1 tablet (10 mg total) by mouth daily. (Patient not taking: Reported on 04/27/2022) 90 tablet 3   azelastine (ASTELIN) 0.1 % nasal spray Place 2 sprays into both nostrils 2 (two) times daily. Use in each nostril as directed (Patient not taking: Reported on 01/11/2023) 30 mL 12   benzonatate (TESSALON) 200 MG capsule Take 200 mg by mouth 3 (three) times daily as needed for cough. (Patient not taking: Reported on 01/11/2023)     fluconazole (DIFLUCAN) 150 MG tablet Take 1 tablet  by mouth; if symptoms remain- take 2nd tablet for yeast (Patient not taking: Reported on 09/02/2022) 2 tablet 0   meloxicam (MOBIC) 15 MG tablet TAKE 1 TABLET BY MOUTH EVERY DAY AS NEEDED FOR PAIN (Patient not taking: Reported on 09/02/2022) 90 tablet 3   montelukast (SINGULAIR) 10 MG tablet Take 1 tablet (10 mg total) by mouth at bedtime. (Patient not taking: Reported on 01/11/2023) 30 tablet 3   nitrofurantoin, macrocrystal-monohydrate, (MACROBID) 100 MG capsule Take 1 capsule (100 mg total) by mouth 2 (two) times daily. (Patient not taking: Reported on 01/11/2023) 14 capsule 0   nystatin (MYCOSTATIN/NYSTOP) powder Apply 1 Application topically 3 (three) times daily. (Patient not taking: Reported on 01/11/2023) 15 g 0   oxybutynin (DITROPAN) 5 MG tablet TAKE 1 TABLET BY MOUTH THREE TIMES A DAY (Patient not  taking: Reported on 01/11/2023) 270 tablet 1   promethazine-dextromethorphan (PROMETHAZINE-DM) 6.25-15 MG/5ML syrup Take 1.25 mLs by mouth 4 (four) times daily as needed for cough (Only takes at night). (Patient not taking: Reported on 01/11/2023)     triamcinolone cream (KENALOG) 0.1 % Apply 1 Application topically 2 (two) times daily. (Patient not taking: Reported on 01/11/2023) 30 g 0   Fluticasone-Umeclidin-Vilant (TRELEGY ELLIPTA) 200-62.5-25 MCG/ACT AEPB Inhale 1 each into the lungs daily. (Patient not taking: Reported on 01/11/2023) 28 each 0   No facility-administered medications prior to visit.

## 2023-01-11 NOTE — Patient Instructions (Signed)
Today, I ordered blood work. You can get them draw at your preferred [facility] draw station. The nearest one to [facility] is at nearby [facility] ([address], [place], [place] [address]). 

## 2023-02-20 ENCOUNTER — Other Ambulatory Visit: Payer: Self-pay | Admitting: Family Medicine

## 2023-02-20 DIAGNOSIS — J4551 Severe persistent asthma with (acute) exacerbation: Secondary | ICD-10-CM

## 2023-02-21 NOTE — Telephone Encounter (Signed)
Requested medications are due for refill today.  Too soon  Requested medications are on the active medications list.  yes  Last refill. 11/25/2022 with 4 month supply  Future visit scheduled.   no  Notes to clinic.  Called pt. Will need to know if pt will be staying with [facility].    Requested Prescriptions  Pending Prescriptions Disp Refills   omeprazole (PRILOSEC) 40 MG capsule [Pharmacy Med Name: OMEPRAZOLE DR 40 MG CAPSULE] 90 capsule 1    Sig: Take 1 capsule (40 mg total) by mouth daily.     Gastroenterology: Proton Pump Inhibitors Passed - 02/20/2023  2:32 AM      Passed - Valid encounter within last 12 months    Recent Outpatient Visits           2 months ago Severe persistent asthma with exacerbation   [facility] [name], FNP   5 months ago Dysuria   [facility], [place], PA-C   10 months ago Yeast infection involving the vagina and surrounding area   [facility] [name], FNP   1 year ago Diarrhea, unspecified type   [facility] Alfredia Ferguson, PA-C   1 year ago Acute non-recurrent frontal sinusitis   [facility] Alfredia Ferguson, PA-C       Future Appointments             In 1 month Raechel Chute, MD [facility] [place]             montelukast (SINGULAIR) 10 MG tablet [Pharmacy Med Name: MONTELUKAST SOD 10 MG TABLET] 90 tablet 1    Sig: TAKE 1 TABLET BY MOUTH EVERYDAY AT BEDTIME     Pulmonology:  Leukotriene Inhibitors Passed - 02/20/2023  2:32 AM      Passed - Valid encounter within last 12 months    Recent Outpatient Visits           2 months ago Severe persistent asthma with exacerbation   [facility] [name], FNP   5 months ago Dysuria   [facility] [place], [place], PA-C   10 months ago Yeast infection involving  the vagina and surrounding area   [facility] [name], FNP   1 year ago Diarrhea, unspecified type   [facility] Alfredia Ferguson, PA-C   1 year ago Acute non-recurrent frontal sinusitis   [facility] Alfredia Ferguson, PA-C       Future Appointments             In 1 month Raechel Chute, MD [facility] at [facility]

## 2023-02-21 NOTE — Telephone Encounter (Signed)
Called pt - Left message requesting call back to schedule OV. Also need to discuss if pt will be staying with [facility].

## 2023-03-27 ENCOUNTER — Telehealth: Payer: Self-pay | Admitting: Student in an Organized Health Care Education/Training Program

## 2023-03-27 NOTE — Telephone Encounter (Signed)
Patient is aware of below message and voiced her understanding.  Nothing further needed.   

## 2023-03-27 NOTE — Telephone Encounter (Signed)
PT lost her blood work ppwk and wonders where she needs to go. Can she go to any [facility] and will she need new ppwk? Pls call @ [phone].

## 2023-03-27 NOTE — Telephone Encounter (Signed)
Lm x1 for pt.   Patient can go to any [facility] for labs.

## 2023-03-28 ENCOUNTER — Other Ambulatory Visit
Admission: RE | Admit: 2023-03-28 | Discharge: 2023-03-28 | Disposition: A | Discharge status: Home / Self Care | Payer: BC Managed Care – PPO | Attending: Student in an Organized Health Care Education/Training Program | Admitting: Student in an Organized Health Care Education/Training Program

## 2023-03-28 DIAGNOSIS — J4520 Mild intermittent asthma, uncomplicated: Secondary | ICD-10-CM | POA: Insufficient documentation

## 2023-03-28 LAB — CBC WITH DIFFERENTIAL/PLATELET
Abs Immature Granulocytes: 0.01 10*3/uL (ref 0.00–0.07)
Basophils Absolute: 0.1 10*3/uL (ref 0.0–0.1)
Basophils Relative: 1 %
Eosinophils Absolute: 0.1 10*3/uL (ref 0.0–0.5)
Eosinophils Relative: 2 %
HCT: 38.6 % (ref 36.0–46.0)
Hemoglobin: 13.4 g/dL (ref 12.0–15.0)
Immature Granulocytes: 0 %
Lymphocytes Relative: 42 %
Lymphs Abs: 2.6 10*3/uL (ref 0.7–4.0)
MCH: 29.5 pg (ref 26.0–34.0)
MCHC: 34.7 g/dL (ref 30.0–36.0)
MCV: 84.8 fL (ref 80.0–100.0)
Monocytes Absolute: 0.4 10*3/uL (ref 0.1–1.0)
Monocytes Relative: 7 %
Neutro Abs: 2.9 10*3/uL (ref 1.7–7.7)
Neutrophils Relative %: 48 %
Platelets: 264 10*3/uL (ref 150–400)
RBC: 4.55 MIL/uL (ref 3.87–5.11)
RDW: 13.2 % (ref 11.5–15.5)
WBC: 6.1 10*3/uL (ref 4.0–10.5)
nRBC: 0 % (ref 0.0–0.2)

## 2023-04-01 LAB — ALLERGEN PANEL (27) + IGE
Alternaria Alternata IgE: <0.1 kU/L
Aspergillus Fumigatus IgE: <0.1 kU/L
Bahia Grass IgE: <0.1 kU/L
Bermuda Grass IgE: <0.1 kU/L
Cat Dander IgE: <0.1 kU/L
Cedar, Mountain IgE: <0.1 kU/L
Cladosporium Herbarum IgE: <0.1 kU/L
Cocklebur IgE: <0.1 kU/L
Cockroach, American IgE: <0.1 kU/L
Common Silver Birch IgE: <0.1 kU/L
D Farinae IgE: <0.1 kU/L
D Pteronyssinus IgE: <0.1 kU/L
Dog Dander IgE: <0.1 kU/L
Elm, American IgE: <0.1 kU/L
Hickory, White IgE: <0.1 kU/L
IgE (Immunoglobulin E), Serum: 12 [IU]/mL (ref 6–495)
Johnson Grass IgE: <0.1 kU/L
Kentucky Bluegrass IgE: <0.1 kU/L
Maple/Box Elder IgE: <0.1 kU/L
Mucor Racemosus IgE: <0.1 kU/L
Oak, White IgE: <0.1 kU/L
Penicillium Chrysogen IgE: <0.1 kU/L
Pigweed, Rough IgE: <0.1 kU/L
Plantain, English IgE: <0.1 kU/L
Ragweed, Short IgE: <0.1 kU/L
Setomelanomma Rostrat: <0.1 kU/L
Timothy Grass IgE: <0.1 kU/L
White Mulberry IgE: <0.1 kU/L

## 2023-04-14 ENCOUNTER — Ambulatory Visit: Payer: BC Managed Care – PPO | Admitting: Student in an Organized Health Care Education/Training Program

## 2023-04-17 ENCOUNTER — Ambulatory Visit: Payer: BC Managed Care – PPO | Admitting: Student in an Organized Health Care Education/Training Program

## 2023-05-03 DIAGNOSIS — Z23 Encounter for immunization: Secondary | ICD-10-CM | POA: Diagnosis not present

## 2023-06-22 DIAGNOSIS — R5383 Other fatigue: Secondary | ICD-10-CM | POA: Diagnosis not present

## 2023-06-22 DIAGNOSIS — K219 Gastro-esophageal reflux disease without esophagitis: Secondary | ICD-10-CM | POA: Diagnosis not present

## 2023-06-22 DIAGNOSIS — Z6836 Body mass index (BMI) 36.0-36.9, adult: Secondary | ICD-10-CM | POA: Diagnosis not present

## 2023-06-22 DIAGNOSIS — Z3202 Encounter for pregnancy test, result negative: Secondary | ICD-10-CM | POA: Diagnosis not present

## 2023-06-22 LAB — CBC AND DIFFERENTIAL
HCT: 38 (ref 36–46)
Hemoglobin: 12 (ref 12.0–16.0)
Platelets: 313 10*3/uL (ref 150–400)
WBC: 7.7

## 2023-06-22 LAB — HEPATIC FUNCTION PANEL
ALT: 14 U/L (ref 7–35)
AST: 12 — AB (ref 13–35)
Alkaline Phosphatase: 67 (ref 25–125)
Bilirubin, Total: 0.3

## 2023-06-22 LAB — LIPID PANEL
Cholesterol: 173 (ref 0–200)
HDL: 44 (ref 35–70)
LDL Cholesterol: 115
Triglycerides: 59 (ref 40–160)

## 2023-06-22 LAB — BASIC METABOLIC PANEL WITH GFR
BUN: 13 (ref 4–21)
CO2: 25 — AB (ref 13–22)
Chloride: 106 (ref 99–108)
Creatinine: 0.8 (ref 0.5–1.1)
Glucose: 97
Potassium: 4.2 meq/L (ref 3.5–5.1)
Sodium: 140 (ref 137–147)

## 2023-06-22 LAB — TSH: TSH: 1.42 (ref 0.41–5.90)

## 2023-06-22 LAB — CBC: RBC: 4.24 (ref 3.87–5.11)

## 2023-06-23 LAB — COMPREHENSIVE METABOLIC PANEL WITH GFR
Albumin: 3.9 (ref 3.5–5.0)
Calcium: 9 (ref 8.7–10.7)
Globulin: 2.5
eGFR: 97

## 2023-06-26 DIAGNOSIS — Z713 Dietary counseling and surveillance: Secondary | ICD-10-CM | POA: Diagnosis not present

## 2023-06-26 DIAGNOSIS — Z723 Lack of physical exercise: Secondary | ICD-10-CM | POA: Diagnosis not present

## 2023-06-26 DIAGNOSIS — Z724 Inappropriate diet and eating habits: Secondary | ICD-10-CM | POA: Diagnosis not present

## 2023-07-06 DIAGNOSIS — E785 Hyperlipidemia, unspecified: Secondary | ICD-10-CM | POA: Diagnosis not present

## 2023-07-06 DIAGNOSIS — R110 Nausea: Secondary | ICD-10-CM | POA: Diagnosis not present

## 2023-07-06 DIAGNOSIS — Z8673 Personal history of transient ischemic attack (TIA), and cerebral infarction without residual deficits: Secondary | ICD-10-CM | POA: Diagnosis not present

## 2023-07-14 DIAGNOSIS — E6609 Other obesity due to excess calories: Secondary | ICD-10-CM | POA: Diagnosis not present

## 2023-07-14 DIAGNOSIS — R609 Edema, unspecified: Secondary | ICD-10-CM | POA: Diagnosis not present

## 2023-07-14 DIAGNOSIS — Z8673 Personal history of transient ischemic attack (TIA), and cerebral infarction without residual deficits: Secondary | ICD-10-CM | POA: Diagnosis not present

## 2023-07-14 DIAGNOSIS — E785 Hyperlipidemia, unspecified: Secondary | ICD-10-CM | POA: Diagnosis not present

## 2023-07-20 DIAGNOSIS — E6609 Other obesity due to excess calories: Secondary | ICD-10-CM | POA: Diagnosis not present

## 2023-07-20 DIAGNOSIS — Z3202 Encounter for pregnancy test, result negative: Secondary | ICD-10-CM | POA: Diagnosis not present

## 2023-07-20 DIAGNOSIS — R5383 Other fatigue: Secondary | ICD-10-CM | POA: Diagnosis not present

## 2023-07-20 DIAGNOSIS — E785 Hyperlipidemia, unspecified: Secondary | ICD-10-CM | POA: Diagnosis not present

## 2023-07-20 DIAGNOSIS — Z8673 Personal history of transient ischemic attack (TIA), and cerebral infarction without residual deficits: Secondary | ICD-10-CM | POA: Diagnosis not present

## 2023-07-31 DIAGNOSIS — E6609 Other obesity due to excess calories: Secondary | ICD-10-CM | POA: Diagnosis not present

## 2023-07-31 DIAGNOSIS — R609 Edema, unspecified: Secondary | ICD-10-CM | POA: Diagnosis not present

## 2023-07-31 DIAGNOSIS — R140 Abdominal distension (gaseous): Secondary | ICD-10-CM | POA: Diagnosis not present

## 2023-07-31 DIAGNOSIS — Z8673 Personal history of transient ischemic attack (TIA), and cerebral infarction without residual deficits: Secondary | ICD-10-CM | POA: Diagnosis not present

## 2023-08-07 DIAGNOSIS — E6609 Other obesity due to excess calories: Secondary | ICD-10-CM | POA: Diagnosis not present

## 2023-08-07 DIAGNOSIS — R632 Polyphagia: Secondary | ICD-10-CM | POA: Diagnosis not present

## 2023-08-07 DIAGNOSIS — E785 Hyperlipidemia, unspecified: Secondary | ICD-10-CM | POA: Diagnosis not present

## 2023-08-07 DIAGNOSIS — Z833 Family history of diabetes mellitus: Secondary | ICD-10-CM | POA: Diagnosis not present

## 2023-08-29 DIAGNOSIS — G43009 Migraine without aura, not intractable, without status migrainosus: Secondary | ICD-10-CM | POA: Diagnosis not present

## 2023-08-29 DIAGNOSIS — E6609 Other obesity due to excess calories: Secondary | ICD-10-CM | POA: Diagnosis not present

## 2023-08-29 DIAGNOSIS — Z3202 Encounter for pregnancy test, result negative: Secondary | ICD-10-CM | POA: Diagnosis not present

## 2023-08-29 DIAGNOSIS — R5383 Other fatigue: Secondary | ICD-10-CM | POA: Diagnosis not present

## 2023-08-29 DIAGNOSIS — Z6833 Body mass index (BMI) 33.0-33.9, adult: Secondary | ICD-10-CM | POA: Diagnosis not present

## 2023-09-07 DIAGNOSIS — K219 Gastro-esophageal reflux disease without esophagitis: Secondary | ICD-10-CM | POA: Diagnosis not present

## 2023-09-07 DIAGNOSIS — M150 Primary generalized (osteo)arthritis: Secondary | ICD-10-CM | POA: Diagnosis not present

## 2023-09-07 DIAGNOSIS — E6609 Other obesity due to excess calories: Secondary | ICD-10-CM | POA: Diagnosis not present

## 2023-09-07 DIAGNOSIS — R632 Polyphagia: Secondary | ICD-10-CM | POA: Diagnosis not present

## 2023-09-14 DIAGNOSIS — Z833 Family history of diabetes mellitus: Secondary | ICD-10-CM | POA: Diagnosis not present

## 2023-09-14 DIAGNOSIS — R5383 Other fatigue: Secondary | ICD-10-CM | POA: Diagnosis not present

## 2023-09-14 DIAGNOSIS — Z8673 Personal history of transient ischemic attack (TIA), and cerebral infarction without residual deficits: Secondary | ICD-10-CM | POA: Diagnosis not present

## 2023-09-14 DIAGNOSIS — E6609 Other obesity due to excess calories: Secondary | ICD-10-CM | POA: Diagnosis not present

## 2023-10-02 ENCOUNTER — Ambulatory Visit: Payer: BC Managed Care – PPO | Admitting: Family Medicine

## 2023-10-02 ENCOUNTER — Encounter: Payer: Self-pay | Admitting: Family Medicine

## 2023-10-02 VITALS — BP 118/73 | HR 70 | Ht 63.0 in | Wt 183.8 lb

## 2023-10-02 DIAGNOSIS — Z1231 Encounter for screening mammogram for malignant neoplasm of breast: Secondary | ICD-10-CM

## 2023-10-02 DIAGNOSIS — Z975 Presence of (intrauterine) contraceptive device: Secondary | ICD-10-CM | POA: Diagnosis not present

## 2023-10-02 DIAGNOSIS — E66811 Obesity, class 1: Secondary | ICD-10-CM | POA: Diagnosis not present

## 2023-10-02 DIAGNOSIS — F332 Major depressive disorder, recurrent severe without psychotic features: Secondary | ICD-10-CM

## 2023-10-02 DIAGNOSIS — Z6832 Body mass index (BMI) 32.0-32.9, adult: Secondary | ICD-10-CM

## 2023-10-02 DIAGNOSIS — Z23 Encounter for immunization: Secondary | ICD-10-CM

## 2023-10-02 DIAGNOSIS — F411 Generalized anxiety disorder: Secondary | ICD-10-CM | POA: Diagnosis not present

## 2023-10-02 MED ORDER — ZEPBOUND 5 MG/0.5ML ~~LOC~~ SOAJ
5.0000 mg | SUBCUTANEOUS | 1 refills | Status: DC
Start: 2023-10-02 — End: 2023-10-30

## 2023-10-02 NOTE — Patient Instructions (Signed)
 Call [facility] to schedule a mammogram [phone]

## 2023-10-02 NOTE — Progress Notes (Signed)
 Acute visit   Patient: Tammy Sosa   DOB: 1970-10-20   53 y.o. Female  MRN: [id number]  PCP: Was Robynn Pane, now Sherlyn Hay, DO   Chief Complaint  Patient presents with   Contraception    Inserted at [facility] 10/2015 at [facility]. Pt would like to have another IUD inserted the same day it is removed.     Weight Loss    Pt reports she has been taking zepbound (tirzeptide) at a weight loss clinic and would like to see if she can have prescribed here. Also reports time to bump up to next dose. Patient reports she has lost 20 lbs since using as she started in November.   Care Management    Pneumococcal Vaccines - declined Tetanus Vaccine - yes Mammogram - yes Colonoscopy - 2019 Dr.Vanga    Subjective    Discussed the use of AI scribe software for clinical note transcription with the patient, who gave verbal consent to proceed.  History of Present Illness   The patient, with a history of stroke and depression, presents with multiple concerns. The primary concern is weight loss. The patient has been attending a weight loss clinic since November and has lost almost twenty pounds in the past three to four months with the help of a medication. The patient reports that the medication has also helped with energy levels and gym attendance. The patient is interested in transferring the management of this medication to primary care due to cost concerns at the weight loss clinic.  The patient also reports a significant worsening of depression and anxiety symptoms, particularly following the recent death of her father. The patient has a history of depression and anxiety, and has previously been on Zoloft and Wellbutrin. However, the patient stopped taking these medications due to side effects and a perceived lack of efficacy. The patient reports thoughts of self-harm but denies any active plans to hurt herself.  The patient also mentions a need for IUD replacement. The patient's current IUD was  placed in 2017 and the patient would like it removed and replaced at the same time to avoid any risk of pregnancy. The patient also reports a current issue with her foot which is causing difficulty walking.  Finally, the patient is due for a mammogram and a tetanus shot. The patient agrees to get the mammogram but prefers to delay the tetanus shot to the next visit.        Review of Systems  Objective    BP 118/73 (BP Location: Right Arm, Patient Position: Sitting, Cuff Size: Normal)   Pulse 70   Ht 5\' 3"  (1.6 m)   Wt 183 lb 12.8 oz (83.4 kg)   SpO2 98%   BMI 32.56 kg/m  Physical Exam Vitals reviewed.  Constitutional:      General: She is not in acute distress.    Appearance: She is well-developed.  HENT:     Head: Normocephalic and atraumatic.  Eyes:     General: No scleral icterus.    Conjunctiva/sclera: Conjunctivae normal.  Cardiovascular:     Rate and Rhythm: Normal rate and regular rhythm.  Pulmonary:     Effort: Pulmonary effort is normal. No respiratory distress.  Skin:    General: Skin is warm and dry.     Findings: No rash.  Neurological:     Mental Status: She is alert and oriented to person, place, and time.  Psychiatric:  Mood and Affect: Mood is depressed. Affect is tearful.        Speech: Speech normal.        Behavior: Behavior normal. Behavior is cooperative.       No results found for any visits on 10/02/23.  Assessment & Plan     Problem List Items Addressed This Visit       Other   Obesity (BMI 30.0-34.9) - Primary   Relevant Medications   tirzepatide (ZEPBOUND) 5 MG/0.5ML Pen   MDD (major depressive disorder), recurrent episode, severe (HCC)   Relevant Orders   Ambulatory referral to Psychiatry   GAD (generalized anxiety disorder)   Relevant Orders   Ambulatory referral to Psychiatry   Other Visit Diagnoses       IUD (intrauterine device) in place       Relevant Orders   Ambulatory referral to Gynecology     Breast cancer  screening by mammogram       Relevant Orders   MM 3D SCREENING MAMMOGRAM BILATERAL BREAST     Immunization due               Weight Management Patient has been on compounded weight loss medication since November, resulting in a 20-pound weight loss, increased energy, improved blood pressure, and potential improvement in cholesterol and borderline diabetes. She is interested in transitioning to a prescription medication covered by insurance. Discussed that compounded medications are not covered by insurance, but Zepbound might be, depending on her [employer] plan. Explained the need for prior authorization and potential delays. - Request lab records from November - [facility] [place] - Initiate prior authorization process for Zepbound - Advise patient to continue with current weight loss clinic until insurance approval is confirmed  Hyperglycemia Patient has h/o elevated blood sugar levels (165 mg/dL) per report but has not been prescribed medication. Weight loss medication may have improved blood sugar levels. Will assess current status with recent lab results. - Request lab records from November to assess current blood sugar levels  Depression and Anxiety Patient reports worsening depression and anxiety, exacerbated by the recent death of her father. She has a poor response to Zoloft and Wellbutrin, with Zoloft causing excessive sleepiness and Wellbutrin worsening her depression. She has stopped taking her medications. There is concern for possible bipolar disorder, as suggested by her husband and her own experiences of manic episodes. Discussed the importance of psychiatric evaluation to differentiate between depression and bipolar disorder and to find appropriate medication management. Advised her to seek urgent help if suicidal thoughts become more active. - Refer to psychiatry for evaluation and management of depression, anxiety, and possible  bipolar disorder - Advise patient to seek urgent help if suicidal thoughts become more active  IUD Management Patient is approaching the 8-year mark with her current IUD, placed in 2017. She prefers simultaneous removal and replacement due to a previous traumatic experience. Our clinic does not have IUDs in stock; will refer her to [facility] for the procedure. - Refer to [facility] for IUD removal and replacement  General Health Maintenance Patient is due for a mammogram and tetanus shot. She has agreed to update her mammogram but prefers to delay the tetanus shot until her next visit. - Order mammogram - Schedule tetanus shot for next visit  Follow-up - Schedule follow-up appointment with Dr. Payton Mccallum in one month.       Meds ordered this encounter  Medications   tirzepatide (ZEPBOUND)  5 MG/0.5ML Pen    Sig: Inject 5 mg into the skin once a week.    Dispense:  2 mL    Refill:  1     Return in about 4 weeks (around 10/30/2023) for chronic disease f/u, With PCP.      Shirlee Latch, MD  [facility] [phone] (phone) [phone] (fax)  [facility]

## 2023-10-05 DIAGNOSIS — Z3202 Encounter for pregnancy test, result negative: Secondary | ICD-10-CM | POA: Diagnosis not present

## 2023-10-05 DIAGNOSIS — E6609 Other obesity due to excess calories: Secondary | ICD-10-CM | POA: Diagnosis not present

## 2023-10-05 DIAGNOSIS — R5383 Other fatigue: Secondary | ICD-10-CM | POA: Diagnosis not present

## 2023-10-05 DIAGNOSIS — M150 Primary generalized (osteo)arthritis: Secondary | ICD-10-CM | POA: Diagnosis not present

## 2023-10-05 DIAGNOSIS — K219 Gastro-esophageal reflux disease without esophagitis: Secondary | ICD-10-CM | POA: Diagnosis not present

## 2023-10-12 DIAGNOSIS — K219 Gastro-esophageal reflux disease without esophagitis: Secondary | ICD-10-CM | POA: Diagnosis not present

## 2023-10-12 DIAGNOSIS — E6609 Other obesity due to excess calories: Secondary | ICD-10-CM | POA: Diagnosis not present

## 2023-10-12 DIAGNOSIS — R5383 Other fatigue: Secondary | ICD-10-CM | POA: Diagnosis not present

## 2023-10-12 DIAGNOSIS — M150 Primary generalized (osteo)arthritis: Secondary | ICD-10-CM | POA: Diagnosis not present

## 2023-10-19 DIAGNOSIS — K219 Gastro-esophageal reflux disease without esophagitis: Secondary | ICD-10-CM | POA: Diagnosis not present

## 2023-10-19 DIAGNOSIS — R5383 Other fatigue: Secondary | ICD-10-CM | POA: Diagnosis not present

## 2023-10-19 DIAGNOSIS — E6609 Other obesity due to excess calories: Secondary | ICD-10-CM | POA: Diagnosis not present

## 2023-10-19 DIAGNOSIS — M150 Primary generalized (osteo)arthritis: Secondary | ICD-10-CM | POA: Diagnosis not present

## 2023-10-20 ENCOUNTER — Encounter: Payer: Self-pay | Admitting: Nurse Practitioner

## 2023-10-20 ENCOUNTER — Ambulatory Visit (INDEPENDENT_AMBULATORY_CARE_PROVIDER_SITE_OTHER): Admitting: Nurse Practitioner

## 2023-10-20 VITALS — BP 122/74 | HR 89 | Temp 98.6°F | Ht 63.0 in | Wt 176.2 lb

## 2023-10-20 DIAGNOSIS — J0100 Acute maxillary sinusitis, unspecified: Secondary | ICD-10-CM | POA: Diagnosis not present

## 2023-10-20 MED ORDER — AMOXICILLIN-POT CLAVULANATE 875-125 MG PO TABS
1.0000 | ORAL_TABLET | Freq: Two times a day (BID) | ORAL | 0 refills | Status: DC
Start: 2023-10-20 — End: 2023-10-31

## 2023-10-20 NOTE — Patient Instructions (Signed)
 Start Augmentin twice a day, take probiotics to reduce GI side effect. Take plain Mucinex and continue zyrtec. Incresae fluid intake and rest. Use steam and humidifier.

## 2023-10-20 NOTE — Progress Notes (Signed)
 Acute Office Visit  Subjective:  Patient ID: Tammy Sosa, female    DOB: [birth year]  Age: 53 y.o. MRN: [id number]  CC:  Chief Complaint  Patient presents with   Acute Visit    Sinus pressure & mucus since 10/16/23    HPI  Tammy Sosa presents for:  Sinus Problem This is a new problem. The current episode started in the past 7 days. The problem has been gradually worsening since onset. Maximum temperature: tactile fever. The fever has been present for Less than 1 day. Associated symptoms include congestion, headaches, shortness of breath, sinus pressure and a sore throat. Pertinent negatives include no ear pain or neck pain. (Upper teeth hurt) Past treatments include oral decongestants (Allegra). The treatment provided mild relief.     Past Medical History:  Diagnosis Date   Abnormal Papanicolaou smear of cervix with positive human papilloma virus (HPV) test 12/11/2014   LSIL/Mild dysplasia   Arthritis    hips   Asthma    as young adult   Atypical chest pain    a. 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 08/2015 TEE:EF 60%, no rwma, large PFO w/ R->L shunt; b. 05/2019 Echo: EF 60-65%, nl RV size/fxn, nl PASP. Neg bubble study.   Plantar fasciitis    Stroke [facility]) 2017   a. 08/2015 in setting of PFO-->ASA 81mg  daily.   Wears contact lenses     Past Surgical History:  Procedure Laterality Date   CERVICAL CONIZATION W/BX     COLONOSCOPY WITH PROPOFOL N/A 06/20/2018   Procedure:  COLONOSCOPY WITH PROPOFOL;  Surgeon: Toney Reil, MD;  Location: [facility];  Service: Endoscopy;  Laterality: N/A;   ESOPHAGOGASTRODUODENOSCOPY (EGD) WITH PROPOFOL N/A 06/20/2018   Procedure: ESOPHAGOGASTRODUODENOSCOPY (EGD) WITH PROPOFOL;  Surgeon: Toney Reil, MD;  Location: [facility];  Service: Endoscopy;  Laterality: N/A;   INTRAUTERINE DEVICE (IUD) INSERTION  11/25/2015   Mirena   KNEE SURGERY Right     Family History  Problem Relation Age of Onset   Breast cancer Mother 75   Ovarian cancer Mother 47   Diabetes Mother    COPD Mother    Hypertension Mother    Skin cancer Father    Stomach cancer Paternal Uncle    Stomach cancer Maternal Grandfather    Diabetes Paternal Grandmother     Social History   Socioeconomic History   Marital status: Married    Spouse name: Not on file   Number of children: Not on file   Years of education: Not on file   Highest education level: Not on file  Occupational History   Not on file  Tobacco Use   Smoking status: Never   Smokeless tobacco: Never  Vaping Use   Vaping status: Never Used  Substance and Sexual Activity   Alcohol use: Not Currently    Alcohol/week:  4.0 standard drinks of alcohol    Types: 1 Glasses of wine, 1 Cans of beer, 2 Shots of liquor per week   Drug use: No   Sexual activity: Yes    Birth control/protection: I.U.D.  Other Topics Concern   Not on file  Social History Narrative   Not on file   Social Drivers of Health   Financial Resource Strain: Not on file  Food Insecurity: Not on file  Transportation Needs: Not on file  Physical Activity: Not on file  Stress: Not on file  Social Connections: Not on file  Intimate Partner Violence: Not on file     Outpatient Medications Prior to Visit  Medication Sig Dispense Refill   albuterol (VENTOLIN HFA) 108 (90 Base) MCG/ACT inhaler Inhale 2 puffs into the lungs every 6 (six) hours as needed for wheezing or shortness of  breath. 8 g 2   budesonide-formoterol (SYMBICORT) 160-4.5 MCG/ACT inhaler Inhale 2 puffs into the lungs in the morning and at bedtime. 1 each 12   levonorgestrel (MIRENA) 20 MCG/24HR IUD 1 each by Intrauterine route once.     montelukast (SINGULAIR) 10 MG tablet TAKE 1 TABLET BY MOUTH EVERYDAY AT BEDTIME 90 tablet 1   omeprazole (PRILOSEC) 40 MG capsule TAKE 1 CAPSULE (40 MG TOTAL) BY MOUTH DAILY. 90 capsule 1   tirzepatide (ZEPBOUND) 5 MG/0.5ML Pen Inject 5 mg into the skin once a week. 2 mL 1   No facility-administered medications prior to visit.    No Known Allergies  ROS Review of Systems  HENT:  Positive for congestion, sinus pressure and sore throat. Negative for ear pain.   Respiratory:  Positive for shortness of breath.   Musculoskeletal:  Negative for neck pain.  Neurological:  Positive for headaches.   Negative unless indicated in HPI.    Objective:    Physical Exam Constitutional:      Appearance: Normal appearance.  HENT:     Right Ear: A middle ear effusion is present.     Left Ear: A middle ear effusion is present.     Nose:     Right Turbinates: Not enlarged.     Left Turbinates: Not enlarged.     Right Sinus: Maxillary sinus tenderness present. No frontal sinus tenderness.     Left Sinus: Maxillary sinus tenderness present. No frontal sinus tenderness.     Mouth/Throat:     Mouth: Mucous membranes are moist.     Dentition: Dental tenderness present.     Pharynx: Postnasal drip present. No pharyngeal swelling, oropharyngeal exudate or posterior oropharyngeal erythema.     Tonsils: No tonsillar exudate.  Cardiovascular:     Rate and Rhythm: Normal rate and regular rhythm.  Pulmonary:     Effort: Pulmonary effort is normal.     Breath sounds: Normal breath sounds. No stridor. No wheezing.  Neurological:     General: No focal deficit present.     Mental Status: She is alert and oriented to person, place, and time. Mental status is at baseline.  Psychiatric:         Mood and Affect: Mood normal.        Behavior: Behavior normal.        Thought Content: Thought content normal.        Judgment: Judgment normal.     BP 122/74   Pulse 89   Temp 98.6 F (37 C)   Ht 5\' 3"  (1.6 m)   Wt 176 lb 3.2 oz (79.9 kg)  SpO2 98%   BMI 31.21 kg/m  Wt Readings from Last 3 Encounters:  10/20/23 176 lb 3.2 oz (79.9 kg)  10/02/23 183 lb 12.8 oz (83.4 kg)  01/11/23 201 lb 3.2 oz (91.3 kg)     Health Maintenance  Topic Date Due   Pneumococcal Vaccine 44-24 Years old (1 of 2 - PCV) Never done   DTaP/Tdap/Td (1 - Tdap) Never done   Zoster Vaccines- Shingrix (1 of 2) Never done   MAMMOGRAM  02/24/2021   Colonoscopy  06/21/2023   COVID-19 Vaccine (1) 11/04/2023 (Originally 06/09/1976)   Cervical Cancer Screening (HPV/Pap Cotest)  04/28/2027   INFLUENZA VACCINE  Completed   Hepatitis C Screening  Completed   HIV Screening  Completed   HPV VACCINES  Aged Out    There are no preventive care reminders to display for this patient.  Lab Results  Component Value Date   TSH 1.740 05/28/2020   Lab Results  Component Value Date   WBC 6.1 03/28/2023   HGB 13.4 03/28/2023   HCT 38.6 03/28/2023   MCV 84.8 03/28/2023   PLT 264 03/28/2023   Lab Results  Component Value Date   NA 139 11/19/2022   K 3.6 11/19/2022   CO2 23 11/19/2022   GLUCOSE 165 (H) 11/19/2022   BUN 15 11/19/2022   CREATININE 0.68 11/19/2022   BILITOT 0.2 10/29/2021   ALKPHOS 79 10/29/2021   AST 13 10/29/2021   ALT 21 10/29/2021   PROT 6.8 10/29/2021   ALBUMIN 4.4 10/29/2021   CALCIUM 8.9 11/19/2022   ANIONGAP 7 11/19/2022   EGFR 103 10/29/2021   Lab Results  Component Value Date   CHOL 177 04/08/2021   Lab Results  Component Value Date   HDL 46 04/08/2021   Lab Results  Component Value Date   LDLCALC 119 (H) 04/08/2021   Lab Results  Component Value Date   TRIG 62 04/08/2021   Lab Results  Component Value Date   CHOLHDL 3.8 04/08/2021   Lab Results   Component Value Date   HGBA1C 5.3 08/23/2015      Assessment & Plan:  Acute non-recurrent maxillary sinusitis Assessment & Plan: Pt is afebrile, non toxic appearing, without respiratoty distress. Symptoms consistent with sinus infection. Will treat with Augmentin. Advised to take probiotics while on antibiotic to reduce GI side effects..    Advised to take Mucinex and continue OTC antihistamine for PND. Incresae fluid intake and rest. Use steam and humidifier.      Other orders -     Amoxicillin-Pot Clavulanate; Take 1 tablet by mouth 2 (two) times daily.  Dispense: 20 tablet; Refill: 0    Follow-up: Return if symptoms worsen or fail to improve.   Kara Dies, NP

## 2023-10-20 NOTE — Assessment & Plan Note (Signed)
 Pt is afebrile, non toxic appearing, without respiratoty distress. Symptoms consistent with sinus infection. Will treat with Augmentin. Advised to take probiotics while on antibiotic to reduce GI side effects..    Advised to take Mucinex and continue OTC antihistamine for PND. Incresae fluid intake and rest. Use steam and humidifier.

## 2023-10-26 ENCOUNTER — Telehealth: Payer: Self-pay

## 2023-10-26 DIAGNOSIS — M150 Primary generalized (osteo)arthritis: Secondary | ICD-10-CM | POA: Diagnosis not present

## 2023-10-26 DIAGNOSIS — R609 Edema, unspecified: Secondary | ICD-10-CM | POA: Diagnosis not present

## 2023-10-26 DIAGNOSIS — K219 Gastro-esophageal reflux disease without esophagitis: Secondary | ICD-10-CM | POA: Diagnosis not present

## 2023-10-26 DIAGNOSIS — E6609 Other obesity due to excess calories: Secondary | ICD-10-CM | POA: Diagnosis not present

## 2023-10-26 NOTE — Telephone Encounter (Signed)
 Copied from CRM [id number]. Topic: Clinical - Prescription Issue >> Oct 26, 2023 10:26 AM Fuller Mandril wrote: Reason for CRM: Patient called states insurance will not cover Zepbound and she would like to know if it can be changed to [facility] which they do cover. Greggory Keen will also require prior authorization per patient. [facility] If possible. Thank You

## 2023-10-31 ENCOUNTER — Other Ambulatory Visit (HOSPITAL_COMMUNITY)
Admission: RE | Admit: 2023-10-31 | Discharge: 2023-10-31 | Disposition: A | Discharge status: Home / Self Care | Source: Ambulatory Visit | Attending: Family Medicine | Admitting: Family Medicine

## 2023-10-31 ENCOUNTER — Ambulatory Visit (INDEPENDENT_AMBULATORY_CARE_PROVIDER_SITE_OTHER): Admitting: Family Medicine

## 2023-10-31 ENCOUNTER — Encounter: Payer: Self-pay | Admitting: Family Medicine

## 2023-10-31 VITALS — BP 113/59 | HR 75 | Temp 98.3°F | Ht 63.0 in | Wt 173.0 lb

## 2023-10-31 DIAGNOSIS — Z113 Encounter for screening for infections with a predominantly sexual mode of transmission: Secondary | ICD-10-CM | POA: Insufficient documentation

## 2023-10-31 DIAGNOSIS — Z202 Contact with and (suspected) exposure to infections with a predominantly sexual mode of transmission: Secondary | ICD-10-CM | POA: Diagnosis not present

## 2023-10-31 DIAGNOSIS — Z8719 Personal history of other diseases of the digestive system: Secondary | ICD-10-CM | POA: Insufficient documentation

## 2023-10-31 DIAGNOSIS — Z0000 Encounter for general adult medical examination without abnormal findings: Secondary | ICD-10-CM | POA: Diagnosis not present

## 2023-10-31 DIAGNOSIS — N760 Acute vaginitis: Secondary | ICD-10-CM | POA: Insufficient documentation

## 2023-10-31 DIAGNOSIS — Z975 Presence of (intrauterine) contraceptive device: Secondary | ICD-10-CM | POA: Diagnosis not present

## 2023-10-31 DIAGNOSIS — B3731 Acute candidiasis of vulva and vagina: Secondary | ICD-10-CM | POA: Insufficient documentation

## 2023-10-31 DIAGNOSIS — Z1211 Encounter for screening for malignant neoplasm of colon: Secondary | ICD-10-CM

## 2023-10-31 DIAGNOSIS — Z1212 Encounter for screening for malignant neoplasm of rectum: Secondary | ICD-10-CM

## 2023-10-31 DIAGNOSIS — B9689 Other specified bacterial agents as the cause of diseases classified elsewhere: Secondary | ICD-10-CM | POA: Diagnosis not present

## 2023-10-31 DIAGNOSIS — R1310 Dysphagia, unspecified: Secondary | ICD-10-CM | POA: Diagnosis not present

## 2023-10-31 DIAGNOSIS — K635 Polyp of colon: Secondary | ICD-10-CM | POA: Insufficient documentation

## 2023-10-31 DIAGNOSIS — Z30430 Encounter for insertion of intrauterine contraceptive device: Secondary | ICD-10-CM | POA: Insufficient documentation

## 2023-10-31 NOTE — Patient Instructions (Addendum)
 Please call the [facility] [phone]) to schedule a routine screening mammogram.   Look at Shingrix VIS

## 2023-10-31 NOTE — Progress Notes (Signed)
 Established patient visit   Patient: Tammy Sosa   DOB: 07-29-1971   53 y.o. Female  MRN: [id number] Visit Date: 10/31/2023  Today's healthcare provider: Sherlyn Hay, DO   Chief Complaint  Patient presents with   Obesity    Patient would like to get Rx for Zepbound but written in generic.   Contraception    Patient wanted to get her IUD replaced.  She missed the appointment with the OB.  She would like to get it done here if possible.    Subjective    HPI Tammy Sosa is a 53 y.o. female who presents today for routine follow-up.  She reports consuming a 500-600 calorie-deficit diet. Gym/ health club routine includes light weights and low impact aerobics. She generally feels fairly well. She reports sleeping poorly, as she has been choking in her sleep; this has been a problem since her CVA.  She does have additional problems to discuss today.   She recently had blood work done in November 2024 and brought the records into clinic with her.  Her cholesterol levels are stable. She is due for a mammogram, which was ordered at her last visit but has not been scheduled.  She is concerned about potential STD exposure due to having had one additional partner and requests testing for syphilis and HIV.  She experiences 'hot spots' or hives when stressed, which recur in the same location despite treatment with a topical medication.  She has a history of stroke and experiences dysphagia, describing episodes of choking on saliva, especially when lying down. She uses multiple pillows to sleep.  She reports being a light sleeper and feeling tired, with difficulty waking up due to low blood pressure.  The low blood pressure she describes close previously noted by a neurologist.  She experiences gastrointestinal issues, such as needing to rush to the restroom after eating certain foods, and attributes a recent upset stomach to her daughter's noodles.  She has been taking Zepbound, which  she has obtained from a compounding pharmacy.  She is requesting a generic prescription of this to see if it can be covered by her insurance.  She underwent emergency gallbladder removal and umbilical hernia repair approximately two years ago. She reports no current issues related to these surgeries.  She has a history of polyps found during a colonoscopy, H. pylori infection, and an ulcer that was penetrating the stomach lining.       Medications: Outpatient Medications Prior to Visit  Medication Sig   albuterol (VENTOLIN HFA) 108 (90 Base) MCG/ACT inhaler Inhale 2 puffs into the lungs every 6 (six) hours as needed for wheezing or shortness of breath.   levonorgestrel (MIRENA) 20 MCG/24HR IUD 1 each by Intrauterine route once.   omeprazole (PRILOSEC) 40 MG capsule TAKE 1 CAPSULE (40 MG TOTAL) BY MOUTH DAILY.   [DISCONTINUED] amoxicillin-clavulanate (AUGMENTIN) 875-125 MG tablet Take 1 tablet by mouth 2 (two) times daily.   [DISCONTINUED] budesonide-formoterol (SYMBICORT) 160-4.5 MCG/ACT inhaler Inhale 2 puffs into the lungs in the morning and at bedtime.   [DISCONTINUED] montelukast (SINGULAIR) 10 MG tablet TAKE 1 TABLET BY MOUTH EVERYDAY AT BEDTIME   No facility-administered medications prior to visit.        Objective    BP (!) 113/59 (BP Location: Left Arm, Patient Position: Sitting, Cuff Size: Normal)   Pulse 75   Temp 98.3 F (36.8 C) (Oral)   Ht 5\' 3"  (1.6 m)   Wt 173  lb (78.5 kg)   SpO2 99%   BMI 30.65 kg/m     Physical Exam Vitals and nursing note reviewed.  Constitutional:      General: She is not in acute distress.    Appearance: Normal appearance.  HENT:     Head: Normocephalic and atraumatic.  Eyes:     General: No scleral icterus.    Conjunctiva/sclera: Conjunctivae normal.  Cardiovascular:     Rate and Rhythm: Normal rate.  Pulmonary:     Effort: Pulmonary effort is normal.  Neurological:     Mental Status: She is alert and oriented to person,  place, and time. Mental status is at baseline.  Psychiatric:        Mood and Affect: Mood normal.        Behavior: Behavior normal.      No results found for any visits on 10/31/23.  Assessment & Plan    Routine adult health maintenance  IUD (intrauterine device) in place  Sexually transmitted disease exposure -     Cervicovaginal ancillary only -     HIV Antibody (routine testing w rflx) -     HCV Ab w Reflex to Quant PCR -     RPR  Dysphagia, unspecified type  Polyp of colon, unspecified part of colon, unspecified type  Encounter for colorectal cancer screening -     Ambulatory referral to Gastroenterology   IUD (intrauterine device) in place Patient wanted to get her IUD removed and replaced.  However, we unfortunately were unable to replace them in this clinic and can only remove them.  Patient preferred to wait and schedule with OB/GYN to have both done at the same time.  Patient is also planning to have her Pap smear done at that time  STD screening Expressed concern about potential exposure to sexually transmitted diseases due to a new partner. Agreed to blood tests for syphilis and HIV. 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 Thickening agents are no longer recommended due to lack of efficacy.  Colon polyps; due for colon cancer screening Overdue for a colonoscopy following previous polyps, which increase the risk of colon cancer. Last colonoscopy was in [place], with a follow-up due in November 2024. - Send referral for colonoscopy  General Health Maintenance Due for tetanus, pneumonia, and shingles vaccines. Discussed benefits of the pneumonia vaccine in reducing hospitalization risk and the shingles vaccine in preventing complications such as nerve pain and vision or hearing  issues. Due for a mammogram, which was ordered by her OB-GYN but not yet scheduled. - Provide contact information to schedule mammogram - Discuss benefits of pneumonia and shingles vaccines - Provide information on shingles vaccine (Shingrix) - Consider tetanus, pneumonia, and shingles vaccines; patient declines today    Due to a misunderstanding surrounding getting her blood drawn during her visit today rather than having to return another day, the patient did leave prior to completing her appointment (complete physical exam was planned for today).  When called on her cell phone shortly thereafter, she opted to return another day to address other concerns and complete her physical exam.  Return in about 1 month (around 11/30/2023) for CPE, Anx/Dep.      I discussed the assessment and treatment plan with the patient  The patient was provided an opportunity to ask questions and all  were answered. The patient agreed with the plan and demonstrated an understanding of the instructions.   The patient was advised to call back or seek an in-person evaluation if the symptoms worsen or if the condition fails to improve as anticipated.    Sherlyn Hay, DO  [facility] [phone] (phone) [phone] (fax)  [facility]

## 2023-11-01 LAB — RPR: RPR Ser Ql: NONREACTIVE

## 2023-11-01 LAB — CERVICOVAGINAL ANCILLARY ONLY
Bacterial Vaginitis (gardnerella): POSITIVE — AB
Candida Glabrata: NEGATIVE
Candida Vaginitis: POSITIVE — AB
Chlamydia: NEGATIVE
Comment: NEGATIVE
Comment: NEGATIVE
Comment: NEGATIVE
Comment: NEGATIVE
Comment: NEGATIVE
Comment: NORMAL
Neisseria Gonorrhea: NEGATIVE
Trichomonas: NEGATIVE

## 2023-11-01 LAB — HCV INTERPRETATION

## 2023-11-01 LAB — HIV ANTIBODY (ROUTINE TESTING W REFLEX): HIV Screen 4th Generation wRfx: NONREACTIVE

## 2023-11-01 LAB — HCV AB W REFLEX TO QUANT PCR: HCV Ab: NONREACTIVE

## 2023-11-08 ENCOUNTER — Encounter: Payer: Self-pay | Admitting: Family Medicine

## 2023-11-08 ENCOUNTER — Other Ambulatory Visit: Payer: Self-pay | Admitting: Family Medicine

## 2023-11-08 DIAGNOSIS — B9689 Other specified bacterial agents as the cause of diseases classified elsewhere: Secondary | ICD-10-CM

## 2023-11-08 DIAGNOSIS — B3731 Acute candidiasis of vulva and vagina: Secondary | ICD-10-CM

## 2023-11-08 MED ORDER — FLUCONAZOLE 150 MG PO TABS
150.0000 mg | ORAL_TABLET | Freq: Once | ORAL | 0 refills | Status: AC
Start: 2023-11-08 — End: 2023-11-08

## 2023-11-08 MED ORDER — METRONIDAZOLE 500 MG PO TABS: 500.0000 mg | ORAL_TABLET | Freq: Two times a day (BID) | ORAL | 0 refills | Status: DC

## 2023-11-16 ENCOUNTER — Encounter: Payer: Self-pay | Admitting: *Deleted

## 2023-11-16 DIAGNOSIS — E6609 Other obesity due to excess calories: Secondary | ICD-10-CM | POA: Diagnosis not present

## 2023-11-16 DIAGNOSIS — K219 Gastro-esophageal reflux disease without esophagitis: Secondary | ICD-10-CM | POA: Diagnosis not present

## 2023-11-16 DIAGNOSIS — Z3202 Encounter for pregnancy test, result negative: Secondary | ICD-10-CM | POA: Diagnosis not present

## 2023-11-16 DIAGNOSIS — M150 Primary generalized (osteo)arthritis: Secondary | ICD-10-CM | POA: Diagnosis not present

## 2023-11-16 DIAGNOSIS — R5383 Other fatigue: Secondary | ICD-10-CM | POA: Diagnosis not present

## 2023-11-23 DIAGNOSIS — R5383 Other fatigue: Secondary | ICD-10-CM | POA: Diagnosis not present

## 2023-11-23 DIAGNOSIS — E6609 Other obesity due to excess calories: Secondary | ICD-10-CM | POA: Diagnosis not present

## 2023-11-23 DIAGNOSIS — K219 Gastro-esophageal reflux disease without esophagitis: Secondary | ICD-10-CM | POA: Diagnosis not present

## 2023-11-23 DIAGNOSIS — M150 Primary generalized (osteo)arthritis: Secondary | ICD-10-CM | POA: Diagnosis not present

## 2023-11-30 DIAGNOSIS — K219 Gastro-esophageal reflux disease without esophagitis: Secondary | ICD-10-CM | POA: Diagnosis not present

## 2023-11-30 DIAGNOSIS — M150 Primary generalized (osteo)arthritis: Secondary | ICD-10-CM | POA: Diagnosis not present

## 2023-11-30 DIAGNOSIS — R609 Edema, unspecified: Secondary | ICD-10-CM | POA: Diagnosis not present

## 2023-11-30 DIAGNOSIS — E6609 Other obesity due to excess calories: Secondary | ICD-10-CM | POA: Diagnosis not present

## 2023-12-07 DIAGNOSIS — E6609 Other obesity due to excess calories: Secondary | ICD-10-CM | POA: Diagnosis not present

## 2023-12-07 DIAGNOSIS — M150 Primary generalized (osteo)arthritis: Secondary | ICD-10-CM | POA: Diagnosis not present

## 2023-12-07 DIAGNOSIS — R609 Edema, unspecified: Secondary | ICD-10-CM | POA: Diagnosis not present

## 2023-12-07 DIAGNOSIS — K219 Gastro-esophageal reflux disease without esophagitis: Secondary | ICD-10-CM | POA: Diagnosis not present

## 2023-12-08 ENCOUNTER — Other Ambulatory Visit (HOSPITAL_COMMUNITY)
Admission: RE | Admit: 2023-12-08 | Discharge: 2023-12-08 | Disposition: A | Discharge status: Home / Self Care | Source: Ambulatory Visit

## 2023-12-08 ENCOUNTER — Ambulatory Visit (INDEPENDENT_AMBULATORY_CARE_PROVIDER_SITE_OTHER)

## 2023-12-08 VITALS — BP 109/76 | HR 75 | Wt 165.0 lb

## 2023-12-08 DIAGNOSIS — Z124 Encounter for screening for malignant neoplasm of cervix: Secondary | ICD-10-CM | POA: Insufficient documentation

## 2023-12-08 DIAGNOSIS — Z3202 Encounter for pregnancy test, result negative: Secondary | ICD-10-CM | POA: Diagnosis not present

## 2023-12-08 DIAGNOSIS — T8332XA Displacement of intrauterine contraceptive device, initial encounter: Secondary | ICD-10-CM

## 2023-12-08 DIAGNOSIS — Z30433 Encounter for removal and reinsertion of intrauterine contraceptive device: Secondary | ICD-10-CM

## 2023-12-08 LAB — POCT URINE PREGNANCY: Preg Test, Ur: NEGATIVE

## 2023-12-08 MED ORDER — LEVONORGESTREL 20 MCG/DAY IU IUD: 1.0000 | INTRAUTERINE_SYSTEM | Freq: Once | INTRAUTERINE | Status: DC

## 2023-12-08 NOTE — Progress Notes (Signed)
     GYNECOLOGY OFFICE PROCEDURE NOTE  Tammy Sosa is a 53 y.o. [id number] here for Mirena  IUD removal and reinsertion. No GYN concerns.  Last pap smear was in 2023 and was abnormal, ASCUS/HPV+. Did not have recommended colposcopy at that time. Will repeat pap smear today.  Patient desires UPT today as she feels "tenderness" in her breasts. UPT neg.   IUD Removal and Reinsertion  Patient identified, informed consent performed, consent signed.   Discussed risks of irregular bleeding, cramping, infection, malpositioning or misplacement of the IUD outside the uterus which may require further procedures. Also discussed >99% contraception efficacy, increased risk of ectopic pregnancy with failure of method.   Emphasized that this did not protect against STIs, condoms recommended during all sexual encounters.  Time out was performed. Speculum placed in the vagina. Pap smear collected. Unable to visualize strings of IUD at cervical os. Attempted to find with string retriever and hemostat without success. Patient tolerated procedure well.   Plan for US  to confirm placement and follow up with MD for removal via US  guidance if US  confirms IUD in place.   Patient was given post-procedure instructions.  She was reminded to have backup contraception for one week during this transition period between IUDs.  Patient was also asked to check IUD strings periodically and follow up as needed with any IUD related concerns.   Verita Glassman Iyauna Sing, CNM

## 2023-12-13 ENCOUNTER — Ambulatory Visit: Payer: Self-pay

## 2023-12-13 LAB — CYTOLOGY - PAP
Comment: NEGATIVE
Diagnosis: UNDETERMINED — AB
High risk HPV: NEGATIVE

## 2023-12-14 ENCOUNTER — Ambulatory Visit
Admission: RE | Admit: 2023-12-14 | Discharge: 2023-12-14 | Disposition: A | Discharge status: Home / Self Care | Source: Ambulatory Visit

## 2023-12-14 DIAGNOSIS — Z975 Presence of (intrauterine) contraceptive device: Secondary | ICD-10-CM | POA: Diagnosis not present

## 2023-12-14 DIAGNOSIS — Z30433 Encounter for removal and reinsertion of intrauterine contraceptive device: Secondary | ICD-10-CM | POA: Insufficient documentation

## 2023-12-19 NOTE — Progress Notes (Signed)
    GYNECOLOGY OFFICE PROCEDURE NOTE  SUBJECTIVE Tammy Sosa is a 53 y.o. [id number] here for mirena  IUD removal, is expired, and using for contraception, is still having regular periods with IUD. Last seen by Fred Jacobsen, CNM for removal, strings not seen, unsuccessful attempt. No GYN concerns.  Last pap smear was on 12/08/23 and was ASCUS and HPV Negative.  OBJECTIVE: BP (!) 101/53   Pulse 72   Ht 5\' 3"  (1.6 m)   Wt 163 lb (73.9 kg)   LMP 12/10/2023   BMI 28.87 kg/m   Physical Exam Vitals and nursing note reviewed. Exam conducted with a chaperone present.  Constitutional:      Appearance: Normal appearance.  HENT:     Head: Normocephalic and atraumatic.  Eyes:     Extraocular Movements: Extraocular movements intact.  Pulmonary:     Effort: Pulmonary effort is normal.  Genitourinary:    General: Normal vulva.     Vagina: Normal.     Cervix: Normal.  Neurological:     General: No focal deficit present.     Mental Status: She is alert.    IUD Removal  Patient identified, informed consent performed, consent signed.  Patient was in the dorsal lithotomy position, normal external genitalia was noted.  A speculum was placed in the patient's vagina, normal discharge was noted, no lesions. The cervix was visualized, no lesions, no abnormal discharge.  The cervix was cleaned with betadine x 2. A single-toothed tenaculum was applied. The strings of the IUD were not seen. Unable to be teased into os with cytobrush. Using bedside ultrasound, IUD was visualized. Kelly forceps were introduced, pt unable to tolerate and asked to discontinue prior to reaching IUD. IUD hook used and unable to grasp device. Alligator forceps introduced and the IUD was grasped and with retrieval, pt reported significant pain and an audible "snap" heard. On inspection, portion of IUD from top flexible "T" was missing. Pt declined further attempt for the fragment due to discomfort. Patient tolerated the procedure well.      ASSESSMENT/PLAN Mirena  IUD partially removed today per procedure note, tolerated well, but requesting sedation for removal of fragment and new IUD insertion. Discussed immediate return of fertility, anticipatory guidance for cramping, spotting, and return of menses. -Pt has selected POP as new form of contraception temporarily while awaiting new IUD placement. Rx sent x 83m. -Tylenol /Ibuprofen prn for pain/cramping; Rx for IBU 800 sent; 800mg  given in office -Will schedule for 01/01/24 for same-day surgery for fragment removal and replacement.    Sofia Dunn, DO  [facility]

## 2023-12-20 ENCOUNTER — Ambulatory Visit (INDEPENDENT_AMBULATORY_CARE_PROVIDER_SITE_OTHER): Admitting: Obstetrics

## 2023-12-20 ENCOUNTER — Encounter: Payer: Self-pay | Admitting: Obstetrics

## 2023-12-20 VITALS — BP 101/53 | HR 72 | Ht 63.0 in | Wt 163.0 lb

## 2023-12-20 DIAGNOSIS — T8331XA Breakdown (mechanical) of intrauterine contraceptive device, initial encounter: Secondary | ICD-10-CM

## 2023-12-20 DIAGNOSIS — Z30432 Encounter for removal of intrauterine contraceptive device: Secondary | ICD-10-CM | POA: Diagnosis not present

## 2023-12-20 DIAGNOSIS — Z30011 Encounter for initial prescription of contraceptive pills: Secondary | ICD-10-CM

## 2023-12-20 MED ORDER — IBUPROFEN 800 MG PO TABS: 800.0000 mg | ORAL_TABLET | Freq: Three times a day (TID) | ORAL | 0 refills | Status: DC | PRN

## 2023-12-20 MED ORDER — NORETHINDRONE 0.35 MG PO TABS: 1.0000 | ORAL_TABLET | Freq: Every day | ORAL | 0 refills | Status: AC

## 2023-12-21 DIAGNOSIS — R609 Edema, unspecified: Secondary | ICD-10-CM | POA: Diagnosis not present

## 2023-12-21 DIAGNOSIS — K219 Gastro-esophageal reflux disease without esophagitis: Secondary | ICD-10-CM | POA: Diagnosis not present

## 2023-12-21 DIAGNOSIS — M150 Primary generalized (osteo)arthritis: Secondary | ICD-10-CM | POA: Diagnosis not present

## 2023-12-21 DIAGNOSIS — E663 Overweight: Secondary | ICD-10-CM | POA: Diagnosis not present

## 2023-12-28 ENCOUNTER — Inpatient Hospital Stay
Admission: RE | Admit: 2023-12-28 | Discharge: 2023-12-28 | Disposition: A | Discharge status: Home / Self Care | Source: Ambulatory Visit

## 2023-12-28 HISTORY — DX: Other mechanical complication of intrauterine contraceptive device, initial encounter: T83.39XA

## 2023-12-28 HISTORY — DX: Personal history of other infectious and parasitic diseases: Z86.19

## 2023-12-28 HISTORY — DX: Polyp of colon: K63.5

## 2023-12-28 HISTORY — DX: Hyperglycemia, unspecified: R73.9

## 2023-12-28 HISTORY — DX: Gastro-esophageal reflux disease without esophagitis: K21.9

## 2023-12-28 HISTORY — DX: Anxiety disorder, unspecified: F41.9

## 2023-12-28 NOTE — Patient Instructions (Signed)
 Your procedure is scheduled on:01-01-24 Monday Report to the Registration Desk on the 1st floor of the [facility].Then proceed to the 2nd floor Surgery Desk To find out your arrival time, please call [phone] between 1PM - 3PM on:12-29-23 Friday If your arrival time is 6:00 am, do not arrive before that time as the Medical Mall entrance doors do not open until 6:00 am.  REMEMBER: Instructions that are not followed completely may result in serious medical risk, up to and including death; or upon the discretion of your surgeon and anesthesiologist your surgery may need to be rescheduled.  Do not eat food OR drink any liquids after midnight the night before surgery.  No gum chewing or hard candies.  One week prior to surgery:Stop NOW (12-28-23) Stop Anti-inflammatories (NSAIDS) such as Advil, Aleve, Ibuprofen, Motrin, Naproxen, Naprosyn and Aspirin  based products such as Excedrin, Goody's Powder, BC Powder. Stop ANY OVER THE COUNTER supplements until after surgery.  You may however, continue to take Tylenol  if needed for pain up until the day of surgery.  Continue taking all of your other prescription medications up until the day of surgery.  ON THE DAY OF SURGERY ONLY TAKE THESE MEDICATIONS WITH SIPS OF WATER : -  No Alcohol for 24 hours before or after surgery.  No Smoking including e-cigarettes for 24 hours before surgery.  No chewable tobacco products for at least 6 hours before surgery.  No nicotine patches on the day of surgery.  Do not use any "recreational" drugs for at least a week (preferably 2 weeks) before your surgery.  Please be advised that the combination of cocaine and anesthesia may have negative outcomes, up to and including death. If you test positive for cocaine, your surgery will be cancelled.  On the morning of surgery brush your teeth with toothpaste and water , you may rinse your mouth with mouthwash if you wish. Do not swallow any toothpaste or  mouthwash.  Do not wear jewelry, make-up, hairpins, clips or nail polish.  For welded (permanent) jewelry: bracelets, anklets, waist bands, etc.  Please have this removed prior to surgery.  If it is not removed, there is a chance that hospital personnel will need to cut it off on the day of surgery.  Do not wear lotions, powders, or perfumes.   Do not shave body hair from the neck down 48 hours before surgery.  Contact lenses, hearing aids and dentures may not be worn into surgery.  Do not bring valuables to the hospital. [facility] is not responsible for any missing/lost belongings or valuables.   Notify your doctor if there is any change in your medical condition (cold, fever, infection).  Wear comfortable clothing (specific to your surgery type) to the hospital.  After surgery, you can help prevent lung complications by doing breathing exercises.  Take deep breaths and cough every 1-2 hours. Your doctor may order a device called an Incentive Spirometer to help you take deep breaths. When coughing or sneezing, hold a pillow firmly against your incision with both hands. This is called "splinting." Doing this helps protect your incision. It also decreases belly discomfort.  If you are being admitted to the hospital overnight, leave your suitcase in the car. After surgery it may be brought to your room.  In case of increased patient census, it may be necessary for you, the patient, to continue your postoperative care in the Same Day Surgery department.  If you are being discharged the day of surgery, you will not  be allowed to drive home. You will need a responsible individual to drive you home and stay with you for 24 hours after surgery.   If you are taking public transportation, you will need to have a responsible individual with you.  Please call the Pre-admissions Testing Dept. at [phone] if you have any questions about these instructions.  Surgery Visitation  Policy:  Patients having surgery or a procedure may have two visitors.  Children under the age of 84 must have an adult with them who is not the patient.

## 2023-12-29 ENCOUNTER — Encounter
Admission: RE | Admit: 2023-12-29 | Discharge: 2023-12-29 | Disposition: A | Discharge status: Home / Self Care | Source: Ambulatory Visit | Attending: Obstetrics | Admitting: Obstetrics

## 2023-12-29 ENCOUNTER — Other Ambulatory Visit: Payer: Self-pay

## 2023-12-29 DIAGNOSIS — Z30432 Encounter for removal of intrauterine contraceptive device: Secondary | ICD-10-CM

## 2023-12-29 DIAGNOSIS — Z01812 Encounter for preprocedural laboratory examination: Secondary | ICD-10-CM

## 2023-12-29 DIAGNOSIS — Z7951 Long term (current) use of inhaled steroids: Secondary | ICD-10-CM | POA: Diagnosis not present

## 2023-12-29 DIAGNOSIS — X58XXXA Exposure to other specified factors, initial encounter: Secondary | ICD-10-CM | POA: Diagnosis not present

## 2023-12-29 DIAGNOSIS — Q2112 Patent foramen ovale: Secondary | ICD-10-CM

## 2023-12-29 DIAGNOSIS — Z8673 Personal history of transient ischemic attack (TIA), and cerebral infarction without residual deficits: Secondary | ICD-10-CM

## 2023-12-29 DIAGNOSIS — Z79899 Other long term (current) drug therapy: Secondary | ICD-10-CM | POA: Diagnosis not present

## 2023-12-29 DIAGNOSIS — Z01810 Encounter for preprocedural cardiovascular examination: Secondary | ICD-10-CM

## 2023-12-29 DIAGNOSIS — T8331XA Breakdown (mechanical) of intrauterine contraceptive device, initial encounter: Secondary | ICD-10-CM

## 2023-12-29 DIAGNOSIS — F419 Anxiety disorder, unspecified: Secondary | ICD-10-CM | POA: Diagnosis not present

## 2023-12-29 DIAGNOSIS — Z30011 Encounter for initial prescription of contraceptive pills: Secondary | ICD-10-CM

## 2023-12-29 DIAGNOSIS — J45909 Unspecified asthma, uncomplicated: Secondary | ICD-10-CM | POA: Diagnosis not present

## 2023-12-29 DIAGNOSIS — Z01818 Encounter for other preprocedural examination: Secondary | ICD-10-CM | POA: Insufficient documentation

## 2023-12-29 HISTORY — DX: Anemia, unspecified: D64.9

## 2023-12-29 HISTORY — DX: Unspecified chronic bronchitis: J42

## 2023-12-29 HISTORY — DX: Prediabetes: R73.03

## 2023-12-29 LAB — CBC
HCT: 38.3 % (ref 36.0–46.0)
Hemoglobin: 13.3 g/dL (ref 12.0–15.0)
MCH: 30 pg (ref 26.0–34.0)
MCHC: 34.7 g/dL (ref 30.0–36.0)
MCV: 86.3 fL (ref 80.0–100.0)
Platelets: 262 10*3/uL (ref 150–400)
RBC: 4.44 MIL/uL (ref 3.87–5.11)
RDW: 13.5 % (ref 11.5–15.5)
WBC: 8.3 10*3/uL (ref 4.0–10.5)
nRBC: 0 % (ref 0.0–0.2)

## 2023-12-29 LAB — BASIC METABOLIC PANEL WITH GFR
Anion gap: 7 (ref 5–15)
BUN: 15 mg/dL (ref 6–20)
CO2: 26 mmol/L (ref 22–32)
Calcium: 8.9 mg/dL (ref 8.9–10.3)
Chloride: 105 mmol/L (ref 98–111)
Creatinine, Ser: 0.9 mg/dL (ref 0.44–1.00)
GFR, Estimated: >60 mL/min (ref >60)
Glucose, Bld: 118 mg/dL — ABNORMAL HIGH (ref 70–99)
Potassium: 3.4 mmol/L — ABNORMAL LOW (ref 3.5–5.1)
Sodium: 138 mmol/L (ref 135–145)

## 2023-12-29 LAB — HCG, QUANTITATIVE, PREGNANCY: hCG, Beta Chain, Quant, S: 1 m[IU]/mL (ref <5)

## 2023-12-29 LAB — TYPE AND SCREEN
ABO/RH(D): O POS
Antibody Screen: NEGATIVE

## 2023-12-29 NOTE — Patient Instructions (Addendum)
 Your procedure is scheduled on:01-01-24 Monday Report to the Registration Desk on the 1st floor of the [facility].Then proceed to the 2nd floor Surgery Desk To find out your arrival time, please call [phone] between 1PM - 3PM on:12-29-23 Friday If your arrival time is 6:00 am, do not arrive before that time as the Medical Mall entrance doors do not open until 6:00 am.  REMEMBER: Instructions that are not followed completely may result in serious medical risk, up to and including death; or upon the discretion of your surgeon and anesthesiologist your surgery may need to be rescheduled.  Do not eat food OR drink any liquids after midnight the night before surgery.  No gum chewing or hard candies.  One week prior to surgery:Stop NOW (12-29-23) Stop Anti-inflammatories (NSAIDS) such as Advil, Aleve, Ibuprofen, Motrin, Naproxen, Naprosyn and Aspirin  based products such as Excedrin, Goody's Powder, BC Powder. Stop ANY OVER THE COUNTER supplements until after surgery (Multivitamin)  You may however, continue to take Tylenol  if needed for pain up until the day of surgery.  Stop tirzepatide  (ZEPBOUND ) 7 days prior to surgery-Do NOT take again until AFTER surgery  Continue taking all of your other prescription medications up until the day of surgery.  ON THE DAY OF SURGERY ONLY TAKE THESE MEDICATIONS WITH SIPS OF WATER : -sertraline  (ZOLOFT )   Use your Albuterol  Inhaler the day of surgery and bring your Inhaler to the hospital  No Alcohol for 24 hours before or after surgery.  No Smoking including e-cigarettes for 24 hours before surgery.  No chewable tobacco products for at least 6 hours before surgery.  No nicotine patches on the day of surgery.  Do not use any "recreational" drugs for at least a week (preferably 2 weeks) before your surgery.  Please be advised that the combination of cocaine and anesthesia may have negative outcomes, up to and including death. If you test positive for  cocaine, your surgery will be cancelled.  On the morning of surgery brush your teeth with toothpaste and water , you may rinse your mouth with mouthwash if you wish. Do not swallow any toothpaste or mouthwash.  Do not wear jewelry, make-up, hairpins, clips or nail polish.  For welded (permanent) jewelry: bracelets, anklets, waist bands, etc.  Please have this removed prior to surgery.  If it is not removed, there is a chance that hospital personnel will need to cut it off on the day of surgery.  Do not wear lotions, powders, or perfumes.   Do not shave body hair from the neck down 48 hours before surgery.  Contact lenses, hearing aids and dentures may not be worn into surgery.  Do not bring valuables to the hospital. [facility] is not responsible for any missing/lost belongings or valuables.   Notify your doctor if there is any change in your medical condition (cold, fever, infection).  Wear comfortable clothing (specific to your surgery type) to the hospital.  After surgery, you can help prevent lung complications by doing breathing exercises.  Take deep breaths and cough every 1-2 hours. Your doctor may order a device called an Incentive Spirometer to help you take deep breaths. When coughing or sneezing, hold a pillow firmly against your incision with both hands. This is called "splinting." Doing this helps protect your incision. It also decreases belly discomfort.  If you are being admitted to the hospital overnight, leave your suitcase in the car. After surgery it may be brought to your room.  In case of increased  patient census, it may be necessary for you, the patient, to continue your postoperative care in the Same Day Surgery department.  If you are being discharged the day of surgery, you will not be allowed to drive home. You will need a responsible individual to drive you home and stay with you for 24 hours after surgery.   If you are taking public transportation, you will  need to have a responsible individual with you.  Please call the Pre-admissions Testing Dept. at [phone] if you have any questions about these instructions.  Surgery Visitation Policy:  Patients having surgery or a procedure may have two visitors.  Children under the age of 46 must have an adult with them who is not the patient.

## 2024-01-01 ENCOUNTER — Ambulatory Visit: Admitting: Anesthesiology

## 2024-01-01 ENCOUNTER — Other Ambulatory Visit: Payer: Self-pay

## 2024-01-01 ENCOUNTER — Encounter: Payer: Self-pay | Admitting: Obstetrics

## 2024-01-01 ENCOUNTER — Ambulatory Visit
Admission: RE | Admit: 2024-01-01 | Discharge: 2024-01-01 | Disposition: A | Discharge status: Home / Self Care | Attending: Obstetrics | Admitting: Obstetrics

## 2024-01-01 ENCOUNTER — Ambulatory Visit: Payer: Self-pay | Admitting: Urgent Care

## 2024-01-01 ENCOUNTER — Encounter
Admission: RE | Disposition: A | Discharge status: Home / Self Care | Payer: Self-pay | Source: Home / Self Care | Attending: Obstetrics

## 2024-01-01 DIAGNOSIS — T8331XA Breakdown (mechanical) of intrauterine contraceptive device, initial encounter: Secondary | ICD-10-CM

## 2024-01-01 DIAGNOSIS — Z8673 Personal history of transient ischemic attack (TIA), and cerebral infarction without residual deficits: Secondary | ICD-10-CM | POA: Diagnosis not present

## 2024-01-01 DIAGNOSIS — J45909 Unspecified asthma, uncomplicated: Secondary | ICD-10-CM | POA: Insufficient documentation

## 2024-01-01 DIAGNOSIS — F419 Anxiety disorder, unspecified: Secondary | ICD-10-CM | POA: Insufficient documentation

## 2024-01-01 DIAGNOSIS — Z30433 Encounter for removal and reinsertion of intrauterine contraceptive device: Secondary | ICD-10-CM

## 2024-01-01 DIAGNOSIS — X58XXXA Exposure to other specified factors, initial encounter: Secondary | ICD-10-CM | POA: Diagnosis not present

## 2024-01-01 DIAGNOSIS — Z79899 Other long term (current) drug therapy: Secondary | ICD-10-CM | POA: Insufficient documentation

## 2024-01-01 DIAGNOSIS — T8331XD Breakdown (mechanical) of intrauterine contraceptive device, subsequent encounter: Secondary | ICD-10-CM | POA: Diagnosis not present

## 2024-01-01 DIAGNOSIS — Z7951 Long term (current) use of inhaled steroids: Secondary | ICD-10-CM | POA: Diagnosis not present

## 2024-01-01 DIAGNOSIS — Z30430 Encounter for insertion of intrauterine contraceptive device: Secondary | ICD-10-CM

## 2024-01-01 DIAGNOSIS — Z30432 Encounter for removal of intrauterine contraceptive device: Secondary | ICD-10-CM | POA: Insufficient documentation

## 2024-01-01 HISTORY — PX: IUD REMOVAL: SHX5392

## 2024-01-01 HISTORY — PX: HYSTEROSCOPY WITH IMPACTED FOREIGN BODY REMOVAL: SHX7590

## 2024-01-01 HISTORY — PX: INTRAUTERINE DEVICE (IUD) INSERTION: SHX5877

## 2024-01-01 LAB — POCT PREGNANCY, URINE: Preg Test, Ur: NEGATIVE

## 2024-01-01 SURGERY — HYSTEROSCOPY WITH IMPACTED FOREIGN BODY REMOVAL
Anesthesia: Monitor Anesthesia Care

## 2024-01-01 MED ORDER — FENTANYL CITRATE (PF) 100 MCG/2ML IJ SOLN
INTRAMUSCULAR | Status: AC
Start: 2024-01-01 — End: ?
  Filled 2024-01-01: qty 2

## 2024-01-01 MED ORDER — DEXMEDETOMIDINE HCL IN NACL 200 MCG/50ML IV SOLN
INTRAVENOUS | Status: DC | PRN
  Administered 2024-01-01: 8 ug via INTRAVENOUS

## 2024-01-01 MED ORDER — LEVONORGESTREL 20 MCG/DAY IU IUD
INTRAUTERINE_SYSTEM | INTRAUTERINE | Status: AC
  Filled 2024-01-01: qty 1

## 2024-01-01 MED ORDER — MIDAZOLAM HCL 2 MG/2ML IJ SOLN
INTRAMUSCULAR | Status: DC | PRN
  Administered 2024-01-01: 2 mg via INTRAVENOUS

## 2024-01-01 MED ORDER — CHLORHEXIDINE GLUCONATE 0.12 % MT SOLN
15.0000 mL | Freq: Once | OROMUCOSAL | Status: AC
  Administered 2024-01-01: 15 mL via OROMUCOSAL

## 2024-01-01 MED ORDER — KETAMINE HCL 10 MG/ML IJ SOLN
INTRAMUSCULAR | Status: DC | PRN
  Administered 2024-01-01: 20 mg via INTRAVENOUS

## 2024-01-01 MED ORDER — CHLORHEXIDINE GLUCONATE 0.12 % MT SOLN
OROMUCOSAL | Status: AC
  Filled 2024-01-01: qty 15

## 2024-01-01 MED ORDER — LIDOCAINE HCL (CARDIAC) PF 100 MG/5ML IV SOSY
PREFILLED_SYRINGE | INTRAVENOUS | Status: DC | PRN
  Administered 2024-01-01: 100 mg via INTRATRACHEAL

## 2024-01-01 MED ORDER — PHENYLEPHRINE 80 MCG/ML (10ML) SYRINGE FOR IV PUSH (FOR BLOOD PRESSURE SUPPORT)
PREFILLED_SYRINGE | INTRAVENOUS | Status: DC | PRN
  Administered 2024-01-01: 80 ug via INTRAVENOUS
  Administered 2024-01-01: 160 ug via INTRAVENOUS

## 2024-01-01 MED ORDER — TRAMADOL HCL 50 MG PO TABS: 50.0000 mg | ORAL_TABLET | Freq: Four times a day (QID) | ORAL | 0 refills | Status: AC | PRN

## 2024-01-01 MED ORDER — ONDANSETRON HCL 4 MG/2ML IJ SOLN
INTRAMUSCULAR | Status: DC | PRN
Start: 2024-01-01 — End: 2024-01-01
  Administered 2024-01-01 (×2): 4 mg via INTRAVENOUS

## 2024-01-01 MED ORDER — PROPOFOL 1000 MG/100ML IV EMUL
INTRAVENOUS | Status: AC
  Filled 2024-01-01: qty 100

## 2024-01-01 MED ORDER — LEVONORGESTREL 20 MCG/DAY IU IUD
1.0000 | INTRAUTERINE_SYSTEM | Freq: Once | INTRAUTERINE | Status: AC
  Administered 2024-01-01: 1 via INTRAUTERINE

## 2024-01-01 MED ORDER — SODIUM CHLORIDE 0.9% FLUSH: 3.0000 mL | INTRAVENOUS | Status: DC | PRN

## 2024-01-01 MED ORDER — DEXAMETHASONE SODIUM PHOSPHATE 10 MG/ML IJ SOLN
INTRAMUSCULAR | Status: DC | PRN
  Administered 2024-01-01: 10 mg via INTRAVENOUS

## 2024-01-01 MED ORDER — ORAL CARE MOUTH RINSE: 15.0000 mL | Freq: Once | OROMUCOSAL | Status: AC

## 2024-01-01 MED ORDER — PROPOFOL 10 MG/ML IV BOLUS
INTRAVENOUS | Status: DC | PRN
Start: 2024-01-01 — End: 2024-01-01
  Administered 2024-01-01: 50 mg via INTRAVENOUS

## 2024-01-01 MED ORDER — MIDAZOLAM HCL 2 MG/2ML IJ SOLN
INTRAMUSCULAR | Status: AC
  Filled 2024-01-01: qty 2

## 2024-01-01 MED ORDER — GLYCOPYRROLATE 0.2 MG/ML IJ SOLN
INTRAMUSCULAR | Status: DC | PRN
  Administered 2024-01-01: .2 mg via INTRAVENOUS

## 2024-01-01 MED ORDER — KETAMINE HCL 50 MG/5ML IJ SOSY
PREFILLED_SYRINGE | INTRAMUSCULAR | Status: AC
  Filled 2024-01-01: qty 5

## 2024-01-01 MED ORDER — SODIUM CHLORIDE 0.9% FLUSH: 3.0000 mL | Freq: Two times a day (BID) | INTRAVENOUS | Status: DC

## 2024-01-01 MED ORDER — PROPOFOL 10 MG/ML IV BOLUS
INTRAVENOUS | Status: AC
  Filled 2024-01-01: qty 20

## 2024-01-01 MED ORDER — IBUPROFEN 800 MG PO TABS: 800.0000 mg | ORAL_TABLET | Freq: Three times a day (TID) | ORAL | 0 refills | Status: AC | PRN

## 2024-01-01 MED ORDER — FENTANYL CITRATE (PF) 100 MCG/2ML IJ SOLN
INTRAMUSCULAR | Status: DC | PRN
  Administered 2024-01-01 (×2): 25 ug via INTRAVENOUS

## 2024-01-01 MED ORDER — 0.9 % SODIUM CHLORIDE (POUR BTL) OPTIME
TOPICAL | Status: DC | PRN
  Administered 2024-01-01: 500 mL

## 2024-01-01 MED ORDER — ACETAMINOPHEN 10 MG/ML IV SOLN
INTRAVENOUS | Status: DC | PRN
  Administered 2024-01-01: 1000 mg via INTRAVENOUS

## 2024-01-01 MED ORDER — LACTATED RINGERS IV SOLN: INTRAVENOUS | Status: DC

## 2024-01-01 MED ORDER — PROPOFOL 500 MG/50ML IV EMUL
INTRAVENOUS | Status: DC | PRN
  Administered 2024-01-01: 125 ug/kg/min via INTRAVENOUS

## 2024-01-01 MED ORDER — ACETAMINOPHEN 10 MG/ML IV SOLN
INTRAVENOUS | Status: AC
  Filled 2024-01-01: qty 100

## 2024-01-01 SURGICAL SUPPLY — 24 items
BAG URINE DRAIN 2000ML AR STRL (UROLOGICAL SUPPLIES) IMPLANT
CATH URTH 16FR FL 2W BLN LF (CATHETERS) IMPLANT
DEVICE MYOSURE LITE (MISCELLANEOUS) IMPLANT
DEVICE MYOSURE REACH (MISCELLANEOUS) IMPLANT
DRSG TELFA 3X8 NADH STRL (GAUZE/BANDAGES/DRESSINGS) IMPLANT
ELECTRODE REM PT RTRN 9FT ADLT (ELECTROSURGICAL) ×1 IMPLANT
GLOVE BIO SURGEON STRL SZ 6 (GLOVE) ×1 IMPLANT
GLOVE BIOGEL PI IND STRL 6 (GLOVE) ×1 IMPLANT
GOWN STRL REUS W/ TWL LRG LVL3 (GOWN DISPOSABLE) ×2 IMPLANT
KIT PROCEDURE FLUENT (KITS) ×1 IMPLANT
KIT TURNOVER CYSTO (KITS) ×1 IMPLANT
MANIFOLD NEPTUNE II (INSTRUMENTS) ×1 IMPLANT
Mirena levonorgestrel-releasing intrauterine syste IMPLANT
PACK DNC HYST (MISCELLANEOUS) ×1 IMPLANT
PAD OB MATERNITY 11 LF (PERSONAL CARE ITEMS) ×1 IMPLANT
PAD PREP OB/GYN DISP 24X41 (PERSONAL CARE ITEMS) ×1 IMPLANT
SCRUB CHG 4% DYNA-HEX 4OZ (MISCELLANEOUS) ×1 IMPLANT
SEAL ROD LENS SCOPE MYOSURE (ABLATOR) ×1 IMPLANT
SET CYSTO W/LG BORE CLAMP LF (SET/KITS/TRAYS/PACK) IMPLANT
SOL .9 NS 3000ML IRR UROMATIC (IV SOLUTION) ×1 IMPLANT
SURGILUBE 2OZ TUBE FLIPTOP (MISCELLANEOUS) ×1 IMPLANT
TRAP FLUID SMOKE EVACUATOR (MISCELLANEOUS) ×1 IMPLANT
TUBING CONNECTING 10 (TUBING) ×1 IMPLANT
WATER STERILE IRR 500ML POUR (IV SOLUTION) ×1 IMPLANT

## 2024-01-01 NOTE — Anesthesia Preprocedure Evaluation (Addendum)
 Anesthesia Evaluation  Patient identified by MRN, date of birth, ID band Patient awake    Reviewed: Allergy & Precautions, H&P , NPO status , Patient's Chart, lab work & pertinent test results  Airway Mallampati: II  TM Distance: >3 FB Neck ROM: full    Dental no notable dental hx.    Pulmonary asthma    Pulmonary exam normal        Cardiovascular Normal cardiovascular exam+ Valvular Problems/Murmurs   08/2015 TEE:EF 60%, no rwma, large PFO w/ R->L shunt; b. 05/2019 Echo: EF 60-65%, nl RV size/fxn, nl PASP. Neg bubble study.   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 sharp/fleeting 01/30/2013: Atypical squamous cells of undetermined significance  (ASCUS) on Papanicolaou smear of cervix No date: Chronic bronchitis (HCC) No date: Colon polyp No date: Complication of anesthesia     Comment:  BP drops No date: Family history of breast cancer in female No date: Family history of ovarian cancer No date: GERD (gastroesophageal reflux disease) No date: H/O arthroscopy of knee No date: Herniated cervical disc No date: History of conization of cervix No date: History of gestational diabetes No date: History of Helicobacter pylori infection No date: Hyperglycemia 09/20/2012: LGSIL on Pap smear of  cervix No date: Migraine headache     Comment:  none in over 1 yr No date: Motion sickness     Comment:  passenger in car No date: PFO (patent foramen ovale)     Comment:  a. 08/2015 TEE:EF 60%, no rwma, large PFO w/ R->L shunt;               b. 05/2019 Echo: EF 60-65%, nl RV size/fxn, nl PASP. Neg               bubble study. No date: Plantar fasciitis No date: Pre-diabetes No date: Retained intrauterine contraceptive device (IUD) 2017: Stroke [facility])     Comment:  a. 08/2015 in setting of PFO-->ASA 81mg  daily. No date: Wears contact lenses  Past Surgical History: No date: CERVICAL CONIZATION W/BX 04/2022: CHOLECYSTECTOMY 06/20/2018: COLONOSCOPY WITH PROPOFOL ; N/A     Comment:  Procedure: COLONOSCOPY WITH PROPOFOL ;  Surgeon: Selena Daily, MD;  Location: [facility];                Service: Endoscopy;  Laterality: N/A; 06/20/2018: ESOPHAGOGASTRODUODENOSCOPY (EGD) WITH PROPOFOL ; N/A     Comment:  Procedure: ESOPHAGOGASTRODUODENOSCOPY (EGD) WITH               PROPOFOL ;  Surgeon: Selena Daily, MD;  Location:               [facility];  Service: Endoscopy;  Laterality:  N/A; 11/25/2015: INTRAUTERINE DEVICE (IUD) INSERTION     Comment:  Mirena  No date: KNEE SURGERY; Right 04/2022: UMBILICAL HERNIA REPAIR     Reproductive/Obstetrics negative OB ROS                             Anesthesia Physical Anesthesia Plan  ASA: 2  Anesthesia Plan: MAC and General   Post-op Pain Management:    Induction: Intravenous  PONV Risk Score and Plan: Propofol  infusion and TIVA  Airway Management Planned: Natural Airway  Additional Equipment:   Intra-op Plan:   Post-operative Plan:   Informed Consent: I have reviewed the patients History and Physical, chart, labs and discussed the procedure including the risks, benefits and alternatives for the proposed anesthesia with the patient or authorized representative  who has indicated his/her understanding and acceptance.     Dental Advisory Given  Plan Discussed with: CRNA and Surgeon  Anesthesia Plan Comments:         Anesthesia Quick Evaluation

## 2024-01-01 NOTE — Discharge Instructions (Addendum)
   IUD AFTERCARE INSTRUCTIONS  Today you may go back to school or work after your visit. You must wait 24 hours after your IUD is put in before you can use tampons, take a bath, or have vaginal sex.  You may have more cramps or heavier bleeding with your periods, or spotting between your periods. This is normal. The cramping and bleeding can last for 3-6 months with the Mirena and [place] IUDs. After 6 months, the cramping and bleeding should get better. Many women will stop having periods after 1 or 2 years with the [place] and [place] IUDs. If you have the Paragard (copper) IUD, you may have more cramping and more bleeding with your periods as long as you have the IUD inside you.  Ibuprofen helps decrease the bleeding and cramping. You can take as many as 4 pills (800 mg) of Ibuprofen every 8 hours with food (each pill contains 200 mg).  Your IUD may come out by itself in the first three months. If you can feel the strings, the IUD is in the right place. If your IUD comes out, you can become pregnant immediately. If you are not sure how to check the strings, we can help you. Meanwhile, use condoms.  Your IUD does not protect against sexually transmitted infections including the HIV virus, genital warts (HPV), gonorrhea, chlamydia, trichomonas, syphilis and herpes. Condoms should be used to decrease the risk of sexually transmitted infections. If you think that you have been exposed to a sexually transmitted infection, please call the clinic. Most infections can be treated WITHOUT removing your IUD.  If you had your IUD placed for birth control, it is effective immediately if it was inserted within five days after the start of your period. If you have it inserted at any other time during your menstrual cycle, use another method of birth control, like condoms for at least 7 days.  Warning Signs Call the clinic if any of the following occurs:  You have fever (over 101F ) or chills.  The implant  comes out or you have concerns about its location.  You have a positive pregnancy test or suspect you might be pregnant.

## 2024-01-01 NOTE — Op Note (Signed)
 OPERATIVE NOTE DATE: 01/01/24  PRE-OPERATIVE DIAGNOSIS:  1) Retained IUD fragment 2) Desires contraception  POST-OPERATIVE DIAGNOSIS:  Post-Op Diagnosis Codes:    * Encounter for IUD removal [Z30.432]    * Mechanical breakdown of intrauterine contraceptive device, initial encounter [T83.31XA]  OPERATION:  Hysteroscopy w/IUD fragment removal; Mirena  IUD insertion  SURGEON(S): Surgeons and Role:    * Sofia Dunn, MD - Primary   ANESTHESIA: Choice  IVF: In: 931cc, Out 810cc, Deficit 120cc  ESTIMATED BLOOD LOSS: None/Minimal  OPERATIVE FINDINGS: Mirena  fragment and portion of string  SPECIMEN: None  COMPLICATIONS: None  DRAINS: None  DISPOSITION: Stable to recovery room  DESCRIPTION OF PROCEDURE:      The patient was prepped and draped in the dorsal lithotomy position and placed under general anesthesia. Her bladder was emptied. The cervix was grasped with a single tooth tenaculum. The uterus was sounded to 9cm. Respecting the position and curvature of her cervix, it was sequentially dilated to accommodate the hysteroscope.  The IUD fragment was visualized and brought into the hysteroscope via suction. Scope was removed and fragment came through the external os. Hysteroscope reintroduced and the grasper placed, grasped the string and was removed.  The hysteroscope was replaced and the endometrium inspected. No abnormalities were seen. The Mirena  IUD was then placed without complication, the strings were trimmed to approximately 3cm. The tenaculum was removed from the cervix and hemostasis was noted. The speculum was removed and the patient went to recovery room in stable condition.   Sofia Dunn, DO [facility] 01/01/2024 12:46 PM

## 2024-01-01 NOTE — H&P (Signed)
 Tammy Sosa is an 53 y.o. female. 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   Pertinent Gynecological History: Menses: perimenopausal Bleeding: unsure, Mirena  IUD has been in place for several years Contraception: IUD Sexually transmitted diseases: no past history Previous GYN Procedures: cervical conization   Last mammogram: normal Date: 02/25/2020 Last pap: ASCUS Date: 12/08/23 OB History: [id number] OB History  Gravida Para Term Preterm AB Living  8 5   2 5   SAB IAB Ectopic Multiple Live Births  1 1   5     # Outcome Date GA Lbr Len/2nd Weight Sex Type Anes PTL Lv  8 IAB 11/09/15          7 Para 05/07/13 [redacted]w[redacted]d  4451 g M Vag-Spont   LIV     Complications: Gestational diabetes  6 Gravida           5 SAB           4 Para     F Vag-Spont   LIV  3 Para     F Vag-Spont   LIV  2 Para     F Vag-Spont   LIV  1 Para     M Vag-Spont   LIV   Menstrual History: Patient's last menstrual period was 12/10/2023.    Past Medical History:  Diagnosis Date   Abnormal Papanicolaou smear of cervix with positive human papilloma virus (HPV) test 12/11/2014   LSIL/Mild dysplasia   Anemia    Anxiety    Arthritis    hips   Asthma    as young adult   Atypical chest pain    a. 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 08/2015 TEE:EF 60%, no rwma, large PFO w/ R->L shunt; b. 05/2019 Echo: EF 60-65%, nl RV size/fxn, nl PASP. Neg bubble study.   Plantar fasciitis    Pre-diabetes    Retained intrauterine contraceptive device (IUD)    Stroke (HCC) 2017   a. 08/2015 in setting of PFO-->ASA 81mg  daily.   Wears contact lenses     Past Surgical History:  Procedure Laterality Date   CERVICAL CONIZATION W/BX     CHOLECYSTECTOMY  04/2022   COLONOSCOPY WITH PROPOFOL  N/A 06/20/2018   Procedure: COLONOSCOPY WITH PROPOFOL ;  Surgeon: Selena Daily, MD;  Location: [facility];  Service: Endoscopy;  Laterality: N/A;   ESOPHAGOGASTRODUODENOSCOPY (EGD) WITH PROPOFOL  N/A 06/20/2018   Procedure: ESOPHAGOGASTRODUODENOSCOPY (EGD) WITH PROPOFOL ;  Surgeon: Selena Daily, MD;  Location: [facility];  Service: Endoscopy;  Laterality: N/A;   INTRAUTERINE DEVICE (IUD) INSERTION  11/25/2015   Mirena    KNEE SURGERY Right    UMBILICAL  HERNIA REPAIR  04/2022    Family History  Problem Relation Age of Onset   Breast cancer Mother 89   Ovarian cancer Mother 65   Diabetes Mother    COPD Mother    Hypertension Mother    Skin cancer Father    Stomach cancer Paternal Uncle    Stomach cancer Maternal Grandfather    Diabetes Paternal Grandmother     Social History:  reports that she has never smoked. She has never used smokeless tobacco. She reports current alcohol use of about 4.0 standard drinks of alcohol per week. She reports current drug use. Drug: Marijuana.  Allergies: No Known Allergies  Medications Prior to Admission  Medication Sig Dispense Refill Last Dose/Taking   albuterol  (VENTOLIN  HFA) 108 (90 Base) MCG/ACT inhaler Inhale 2 puffs into the lungs every 6 (six) hours as needed for wheezing or shortness of breath. 8 g 2 01/01/2024   ibuprofen  (ADVIL ) 800 MG tablet Take 1 tablet (800 mg total) by mouth every 8 (eight) hours as needed. 30 tablet 0 Past Week   Multiple Vitamin  (MULTIVITAMIN ADULT PO) Take 1 tablet by mouth See admin instructions. When pt remembers   Past Week   norethindrone  (MICRONOR ) 0.35 MG tablet Take 1 tablet (0.35 mg total) by mouth daily. 28 tablet 0 Past Week   omeprazole  (PRILOSEC) 40 MG capsule TAKE 1 CAPSULE (40 MG TOTAL) BY MOUTH DAILY. (Patient taking differently: Take 40 mg by mouth as needed.) 90 capsule 1 Past Week   sertraline  (ZOLOFT ) 100 MG tablet Take 100 mg by mouth every morning.   Past Week   tirzepatide  (ZEPBOUND ) 10 MG/0.5ML Pen Inject 10 mg into the skin once a week. On Thursdays   12/21/2023    Review of Systems  Constitutional: Negative.   HENT: Negative.    Respiratory: Negative.    Cardiovascular: Negative.   Musculoskeletal: Negative.   Neurological: Negative.     Blood pressure 117/80, pulse 69, temperature 98.1 F (36.7 C), temperature source Temporal, resp. rate 18, height 5\' 3"  (1.6 m), weight 73.9 kg, last menstrual period 12/10/2023, SpO2 99%. Physical Exam Vitals reviewed.  Constitutional:      Appearance: Normal appearance.  HENT:     Head: Normocephalic and atraumatic.  Eyes:     Extraocular Movements: Extraocular movements intact.  Cardiovascular:     Rate and Rhythm: Normal rate.  Pulmonary:     Effort: Pulmonary effort is normal.  Musculoskeletal:        General: Normal range of motion.  Neurological:     General: No focal deficit present.     Mental Status: She is alert.  Psychiatric:        Mood and Affect: Mood normal.     Results for orders placed or performed during the hospital encounter of 01/01/24 (from the past 24 hours)  Pregnancy, urine POC     Status: None   Collection Time: 01/01/24 10:50 AM  Result Value Ref Range   Preg Test, Ur NEGATIVE NEGATIVE    No results found.  Assessment/Plan: 53 y.o. with a retained Mirena  IUD fragment in situ after removal in office. Plan for hysteroscopic removal and new Mirena  IUD insertion.   Tammy Sosa 01/01/2024, 12:01 PM

## 2024-01-01 NOTE — Transfer of Care (Signed)
 Immediate Anesthesia Transfer of Care Note  Patient: Tammy Sosa  Procedure(s) Performed: HYSTEROSCOPY WITH IMPACTED FOREIGN BODY REMOVAL REMOVAL, INTRAUTERINE DEVICE INSERTION, INTRAUTERINE DEVICE  Patient Location: PACU  Anesthesia Type:General  Level of Consciousness: awake, drowsy, and patient cooperative  Airway & Oxygen Therapy: Patient Spontanous Breathing and Patient connected to face mask oxygen  Post-op Assessment: Report given to RN and Post -op Vital signs reviewed and stable  Post vital signs: Reviewed and stable  Last Vitals:  Vitals Value Taken Time  BP 89/60 01/01/24 1249  Temp 36.5 C 01/01/24 1249  Pulse 80 01/01/24 1251  Resp 19 01/01/24 1251  SpO2 100 % 01/01/24 1251  Vitals shown include unfiled device data.  Last Pain:  Vitals:   01/01/24 1043  TempSrc: Temporal  PainSc: 0-No pain         Complications: No notable events documented.

## 2024-01-01 NOTE — Anesthesia Procedure Notes (Addendum)
 Procedure Name: General with mask airway Date/Time: 01/01/2024 12:10 PM  Performed by: Niki Barter, CRNAPre-anesthesia Checklist: Patient identified, Emergency Drugs available, Suction available and Patient being monitored Patient Re-evaluated:Patient Re-evaluated prior to induction Oxygen Delivery Method: Simple face mask Induction Type: IV induction Airway Equipment and Method: Oral airway Placement Confirmation: positive ETCO2 and breath sounds checked- equal and bilateral Dental Injury: Teeth and Oropharynx as per pre-operative assessment

## 2024-01-02 ENCOUNTER — Encounter: Payer: Self-pay | Admitting: Obstetrics

## 2024-01-02 NOTE — Anesthesia Postprocedure Evaluation (Signed)
 Anesthesia Post Note  Patient: Gracieann R Dawe  Procedure(s) Performed: HYSTEROSCOPY WITH IMPACTED FOREIGN BODY REMOVAL REMOVAL, INTRAUTERINE DEVICE INSERTION, INTRAUTERINE DEVICE  Patient location during evaluation: PACU Anesthesia Type: General Level of consciousness: awake and alert Pain management: pain level controlled Vital Signs Assessment: post-procedure vital signs reviewed and stable Respiratory status: spontaneous breathing, nonlabored ventilation and respiratory function stable Cardiovascular status: blood pressure returned to baseline and stable Postop Assessment: no apparent nausea or vomiting Anesthetic complications: no   No notable events documented.   Last Vitals:  Vitals:   01/01/24 1345 01/01/24 1358  BP: (!) 92/55 (!) 102/59  Pulse: 66 63  Resp: 16 15  Temp: (!) 36.3 C   SpO2: 95% 100%    Last Pain:  Vitals:   01/01/24 1358  TempSrc:   PainSc: 0-No pain                 Baltazar Bonier

## 2024-01-04 DIAGNOSIS — E663 Overweight: Secondary | ICD-10-CM | POA: Diagnosis not present

## 2024-01-04 DIAGNOSIS — M150 Primary generalized (osteo)arthritis: Secondary | ICD-10-CM | POA: Diagnosis not present

## 2024-01-04 DIAGNOSIS — D509 Iron deficiency anemia, unspecified: Secondary | ICD-10-CM | POA: Diagnosis not present

## 2024-01-04 DIAGNOSIS — R609 Edema, unspecified: Secondary | ICD-10-CM | POA: Diagnosis not present

## 2024-01-09 ENCOUNTER — Other Ambulatory Visit: Payer: Self-pay | Admitting: Obstetrics

## 2024-01-09 DIAGNOSIS — Z30011 Encounter for initial prescription of contraceptive pills: Secondary | ICD-10-CM

## 2024-01-19 DIAGNOSIS — Z3202 Encounter for pregnancy test, result negative: Secondary | ICD-10-CM | POA: Diagnosis not present

## 2024-01-19 DIAGNOSIS — M150 Primary generalized (osteo)arthritis: Secondary | ICD-10-CM | POA: Diagnosis not present

## 2024-01-19 DIAGNOSIS — D509 Iron deficiency anemia, unspecified: Secondary | ICD-10-CM | POA: Diagnosis not present

## 2024-01-19 DIAGNOSIS — R609 Edema, unspecified: Secondary | ICD-10-CM | POA: Diagnosis not present

## 2024-01-19 DIAGNOSIS — E663 Overweight: Secondary | ICD-10-CM | POA: Diagnosis not present

## 2024-01-25 DIAGNOSIS — R5383 Other fatigue: Secondary | ICD-10-CM | POA: Diagnosis not present

## 2024-01-25 DIAGNOSIS — D509 Iron deficiency anemia, unspecified: Secondary | ICD-10-CM | POA: Diagnosis not present

## 2024-01-25 DIAGNOSIS — R609 Edema, unspecified: Secondary | ICD-10-CM | POA: Diagnosis not present

## 2024-01-25 DIAGNOSIS — E663 Overweight: Secondary | ICD-10-CM | POA: Diagnosis not present

## 2024-01-29 NOTE — Progress Notes (Unsigned)
    OBSTETRICS/GYNECOLOGY POST-OPERATIVE CLINIC VISIT  Subjective:     Tammy Sosa is a 53 y.o. female who presents to the clinic 4 weeks status post [facility] w/IUD fragment removal and new Mirena  IUD insertion. Eating a regular diet without difficulty. Bowel movements are normal. The patient is not having any pain.  The following portions of the patient's history were reviewed and updated as appropriate: allergies, current medications, past family history, past medical history, past social history, past surgical history, and problem list.  Review of Systems Pertinent items are noted in HPI.   Objective:   BP 90/60   Pulse 71   Ht 5' 2 (1.575 m)   Wt 157 lb (71.2 kg)   BMI 28.72 kg/m  Body mass index is 28.72 kg/m.  General:  alert and no distress  Abdomen: soft, bowel sounds active, non-tender  GU:   IUD strings visualized    Pathology:  N/A  Assessment:   Patient s/p HSC w/IUD fragment removal, new Mirena  IUD insertion, doing well and strings correct today. Also requesting STI screening, including BV and yeast; swab obtained.   Plan:  1. Continue any current medications as instructed by provider. 2. Wound care discussed. 3. Operative findings again reviewed, images shown to patient.  4. Activity restrictions: none 5. Anticipated return to work: not applicable. 6. Will MyChart swab and lab results  Follow up: colposcopy when able; 1year for annual, sooner prn  Estil Mangle, DO [facility]

## 2024-01-31 ENCOUNTER — Ambulatory Visit (INDEPENDENT_AMBULATORY_CARE_PROVIDER_SITE_OTHER): Admitting: Obstetrics

## 2024-01-31 ENCOUNTER — Other Ambulatory Visit (HOSPITAL_COMMUNITY)
Admission: RE | Admit: 2024-01-31 | Discharge: 2024-01-31 | Disposition: A | Discharge status: Home / Self Care | Source: Ambulatory Visit | Attending: Obstetrics | Admitting: Obstetrics

## 2024-01-31 ENCOUNTER — Encounter: Payer: Self-pay | Admitting: Obstetrics

## 2024-01-31 VITALS — BP 90/60 | HR 71 | Ht 62.0 in | Wt 157.0 lb

## 2024-01-31 DIAGNOSIS — Z30431 Encounter for routine checking of intrauterine contraceptive device: Secondary | ICD-10-CM | POA: Diagnosis not present

## 2024-01-31 DIAGNOSIS — Z113 Encounter for screening for infections with a predominantly sexual mode of transmission: Secondary | ICD-10-CM | POA: Diagnosis not present

## 2024-02-01 LAB — CERVICOVAGINAL ANCILLARY ONLY
Bacterial Vaginitis (gardnerella): POSITIVE — AB
Candida Glabrata: NEGATIVE
Candida Vaginitis: NEGATIVE
Chlamydia: NEGATIVE
Comment: NEGATIVE
Comment: NEGATIVE
Comment: NEGATIVE
Comment: NEGATIVE
Comment: NEGATIVE
Comment: NORMAL
Neisseria Gonorrhea: NEGATIVE
Trichomonas: NEGATIVE

## 2024-02-01 LAB — HEP, RPR, HIV PANEL
HIV Screen 4th Generation wRfx: NONREACTIVE
Hepatitis B Surface Ag: NEGATIVE
RPR Ser Ql: NONREACTIVE

## 2024-02-05 ENCOUNTER — Ambulatory Visit: Payer: Self-pay | Admitting: Obstetrics

## 2024-02-05 DIAGNOSIS — N760 Acute vaginitis: Secondary | ICD-10-CM

## 2024-02-05 MED ORDER — METRONIDAZOLE 500 MG PO TABS: 500.0000 mg | ORAL_TABLET | Freq: Two times a day (BID) | ORAL | 0 refills | Status: AC

## 2024-02-06 DIAGNOSIS — L03116 Cellulitis of left lower limb: Secondary | ICD-10-CM | POA: Diagnosis not present

## 2024-02-06 DIAGNOSIS — L02416 Cutaneous abscess of left lower limb: Secondary | ICD-10-CM | POA: Diagnosis not present

## 2024-03-14 ENCOUNTER — Emergency Department
Admission: EM | Admit: 2024-03-14 | Discharge: 2024-03-14 | Disposition: A | Discharge status: Home / Self Care | Attending: Emergency Medicine | Admitting: Emergency Medicine

## 2024-03-14 ENCOUNTER — Other Ambulatory Visit: Payer: Self-pay

## 2024-03-14 ENCOUNTER — Emergency Department

## 2024-03-14 ENCOUNTER — Ambulatory Visit: Payer: Self-pay | Admitting: *Deleted

## 2024-03-14 DIAGNOSIS — R079 Chest pain, unspecified: Secondary | ICD-10-CM

## 2024-03-14 DIAGNOSIS — R202 Paresthesia of skin: Secondary | ICD-10-CM | POA: Diagnosis not present

## 2024-03-14 DIAGNOSIS — J45909 Unspecified asthma, uncomplicated: Secondary | ICD-10-CM | POA: Diagnosis not present

## 2024-03-14 DIAGNOSIS — R0789 Other chest pain: Secondary | ICD-10-CM | POA: Insufficient documentation

## 2024-03-14 DIAGNOSIS — R200 Anesthesia of skin: Secondary | ICD-10-CM | POA: Insufficient documentation

## 2024-03-14 DIAGNOSIS — M542 Cervicalgia: Secondary | ICD-10-CM | POA: Diagnosis not present

## 2024-03-14 LAB — CBC
HCT: 38.9 % (ref 36.0–46.0)
Hemoglobin: 12.5 g/dL (ref 12.0–15.0)
MCH: 29.6 pg (ref 26.0–34.0)
MCHC: 32.1 g/dL (ref 30.0–36.0)
MCV: 92 fL (ref 80.0–100.0)
Platelets: 279 K/uL (ref 150–400)
RBC: 4.23 MIL/uL (ref 3.87–5.11)
RDW: 13.8 % (ref 11.5–15.5)
WBC: 8.8 K/uL (ref 4.0–10.5)
nRBC: 0 % (ref 0.0–0.2)

## 2024-03-14 LAB — BASIC METABOLIC PANEL WITH GFR
Anion gap: 7 (ref 5–15)
BUN: 12 mg/dL (ref 6–20)
CO2: 26 mmol/L (ref 22–32)
Calcium: 9.1 mg/dL (ref 8.9–10.3)
Chloride: 106 mmol/L (ref 98–111)
Creatinine, Ser: 0.81 mg/dL (ref 0.44–1.00)
GFR, Estimated: >60 mL/min (ref >60)
Glucose, Bld: 115 mg/dL — ABNORMAL HIGH (ref 70–99)
Potassium: 3.5 mmol/L (ref 3.5–5.1)
Sodium: 139 mmol/L (ref 135–145)

## 2024-03-14 LAB — TROPONIN I (HIGH SENSITIVITY): Troponin I (High Sensitivity): 2 ng/L (ref <18)

## 2024-03-14 NOTE — Telephone Encounter (Signed)
 FYI Only or Action Required?: FYI only for provider.  Patient was last seen in primary care on 10/31/2023 by Tammy Lauraine SAILOR, DO.  Called Nurse Triage reporting Chest Pain.  Symptoms began several days ago.  Interventions attempted: Nothing.  Symptoms are: gradually worsening.  Triage Disposition: Call EMS 911 Now  Patient/caregiver understands and will follow disposition?: Yes              Copied from CRM [id number]. Topic: Clinical - Red Word Triage >> Mar 14, 2024  3:14 PM [name] wrote: [facility] that prompted transfer to Nurse Triage: chest pain, lt arm heaviness, SOB Reason for Disposition  Sounds like a life-threatening emergency to the triager  Answer Assessment - Initial Assessment Questions Recommended to call 911 offered to call 911 for patient and patient reports she will call. Instructed patient do not wait.     1. LOCATION: Where does it hurt?       Chest pain 2. RADIATION: Does the pain go anywhere else? (e.g., into neck, jaw, arms, back)     Left arm heavy  3. ONSET: When did the chest pain begin? (Minutes, hours or days)      Couple of days ago  4. PATTERN: Does the pain come and go, or has it been constant since it started?  Does it get worse with exertion?      Constant  5. DURATION: How long does it last (e.g., seconds, minutes, hours)     Greater than 5 minutes  6. SEVERITY: How bad is the pain?  (e.g., Scale 1-10; mild, moderate, or severe)     Na  7. CARDIAC RISK FACTORS: Do you have any history of heart problems or risk factors for heart disease? (e.g., angina, prior heart attack; diabetes, high blood pressure, high cholesterol, smoker, or strong family history of heart disease)     Hx stroke  8. PULMONARY RISK FACTORS: Do you have any history of lung disease?  (e.g., blood clots in lung, asthma, emphysema, birth control pills)     Na  9. CAUSE: What do you think is causing the chest pain?     Na  10. OTHER SYMPTOMS:  Do you have any other symptoms? (e.g., dizziness, nausea, vomiting, sweating, fever, difficulty breathing, cough)       Headache , chest pain left arm heaviness, SOB 11. PREGNANCY: Is there any chance you are pregnant? When was your last menstrual period?       na  Protocols used: Chest Pain-A-AH

## 2024-03-14 NOTE — ED Provider Notes (Signed)
 [facility] Provider Note   Event Date/Time   First MD Initiated Contact with Patient 03/14/24 2009     (approximate) History  Chest Pain  HPI Tammy Sosa is a 53 y.o. female with a past medical history of of asthma prediabetes, CVA, and herniated cervical disc who presents complaining of chest pain and left arm numbness that began 3 days prior to arrival has been intermittent since onset.  Patient states that she did have an episode of diaphoresis yesterday that resolved spontaneously and lasted approximately 5 minutes.  Patient denies any exertional worsening of this pain.  Patient states that this pain comes and goes but has no exacerbating or relieving factors.  Patient states the numbness in the left arm has resolved at this time and only lasted approximately 3 hours.  Patient also endorses left neck pain ROS: Patient currently denies any vision changes, tinnitus, difficulty speaking, facial droop, sore throat, shortness of breath, abdominal pain, nausea/vomiting/diarrhea, dysuria, or weakness/numbness/paresthesias in any extremity   Physical Exam  Triage Vital Signs: ED Triage Vitals  Encounter Vitals Group     BP 03/14/24 1704 117/68     Girls Systolic BP Percentile --      Girls Diastolic BP Percentile --      Boys Systolic BP Percentile --      Boys Diastolic BP Percentile --      Pulse Rate 03/14/24 1704 78     Resp 03/14/24 1704 18     Temp 03/14/24 1704 98.2 F (36.8 C)     Temp Source 03/14/24 1704 Oral     SpO2 03/14/24 1704 98 %     Weight 03/14/24 1702 160 lb (72.6 kg)     Height 03/14/24 1702 5' 3 (1.6 m)     Head Circumference --      Peak Flow --      Pain Score 03/14/24 1702 6     Pain Loc --      Pain Education --      Exclude from Growth Chart --    Most recent vital signs: Vitals:   03/14/24 2130 03/14/24 2150  BP: 128/66 128/68  Pulse: 69 69  Resp: (!) 26 18  Temp:  98.2 F (36.8 C)  SpO2: 100% 100%   General: Awake,  oriented x4. CV:  Good peripheral perfusion. Resp:  Normal effort. Abd:  No distention. Other:  Middle-aged overweight Caucasian female resting comfortably in no acute distress ED Results / Procedures / Treatments  Labs (all labs ordered are listed, but only abnormal results are displayed) Labs Reviewed  BASIC METABOLIC PANEL WITH GFR - Abnormal; Notable for the following components:      Result Value   Glucose, Bld 115 (*)    All other components within normal limits  CBC  POC URINE PREG, ED  TROPONIN I (HIGH SENSITIVITY)   EKG ED ECG REPORT I, Artist MARLA Kerns, the attending physician, personally viewed and interpreted this ECG. Date: 03/14/2024 EKG Time: 1809 Rate: 58 Rhythm: Bradycardic sinus rhythm QRS Axis: normal Intervals: normal ST/T Wave abnormalities: normal Narrative Interpretation: Bradycardic sinus rhythm.  No evidence of acute ischemia RADIOLOGY ED MD interpretation: 2 view chest x-ray interpreted by me shows no evidence of acute abnormalities including no pneumonia, pneumothorax, or widened mediastinum - All radiology independently interpreted and agree with radiology assessment Official radiology report(s): DG Chest 2 View Result Date: 03/14/2024 EXAM: 2 VIEW(S) XRAY OF THE CHEST 03/14/2024 05:38:00 PM COMPARISON: PA and  lateral radiographs of the chest dated 11/19/2022. CLINICAL HISTORY: Chest pain. Patient reports chest pain x 3 days and today she started to have left arm numbness. Yesterday was clammy and sweaty. Denies SOB, denies n/v/d. Reports previous hx of stroke in 2017. FINDINGS: LUNGS AND PLEURA: No focal pulmonary opacity. No pulmonary edema. No pleural effusion. No pneumothorax. HEART AND MEDIASTINUM: No acute abnormality of the cardiac and mediastinal silhouettes. BONES AND SOFT TISSUES: No acute osseous abnormality. IMPRESSION: 1. No acute process. Electronically signed by: evalene coho 03/14/2024 06:36 PM EDT RP Workstation: [id number]    PROCEDURES: Critical Care performed: No Procedures MEDICATIONS ORDERED IN ED: Medications - No data to display IMPRESSION / MDM / ASSESSMENT AND PLAN / ED COURSE  I reviewed the triage vital signs and the nursing notes.                             The patient is on the cardiac monitor to evaluate for evidence of arrhythmia and/or significant heart rate changes. Patient's presentation is most consistent with acute presentation with potential threat to life or bodily function. This patient presents with atypical chest pain, most likely secondary to musculoskeletal injury. Differential diagnosis includes rib fracture, costochondritis, sternal fracture. Low suspicion for ACS, acute PE (PERC negative), pericarditis / myocarditis, thoracic aortic dissection, pneumothorax, pneumonia or other acute infectious process. Presentation not consistent with other acute, emergent causes of chest pain at this time.  Troponin negative x 1. Plan to order CXR to evaluate for acute cardiopulmonary causes.  Plan: EKG, CXR, pain control  Dispo: Discharge home with home care   FINAL CLINICAL IMPRESSION(S) / ED DIAGNOSES   Final diagnoses:  Chest pain, unspecified type  Left arm numbness  Neck pain on left side   Rx / DC Orders   ED Discharge Orders          Ordered    Ambulatory referral to Cardiology       Comments: If you have not heard from the Cardiology office within the next 72 hours please call [phone].   03/14/24 2132           Note:  This document was prepared using Dragon voice recognition software and may include unintentional dictation errors.   Jossie Artist POUR, MD 03/14/24 [phone]

## 2024-03-14 NOTE — ED Triage Notes (Signed)
 Chest pain x 3 days today started to have left arm numbness. Yesterday was clammy and sweaty. Denies SOB, denies n/v/d

## 2024-03-14 NOTE — Discharge Instructions (Addendum)
 Please use ibuprofen (Motrin) up to 800 mg every 8 hours, naproxen (Naprosyn) up to 500 mg every 12 hours, and/or acetaminophen (Tylenol) up to 4 g/day for any continued pain.  Please do not use this medication regimen for longer than 7 days

## 2024-04-02 DIAGNOSIS — Z833 Family history of diabetes mellitus: Secondary | ICD-10-CM | POA: Diagnosis not present

## 2024-04-02 DIAGNOSIS — E663 Overweight: Secondary | ICD-10-CM | POA: Diagnosis not present

## 2024-04-02 DIAGNOSIS — Z8249 Family history of ischemic heart disease and other diseases of the circulatory system: Secondary | ICD-10-CM | POA: Diagnosis not present

## 2024-04-02 DIAGNOSIS — R7303 Prediabetes: Secondary | ICD-10-CM | POA: Diagnosis not present

## 2024-05-23 ENCOUNTER — Ambulatory Visit
Admission: RE | Admit: 2024-05-23 | Discharge: 2024-05-23 | Disposition: A | Discharge status: Home / Self Care | Source: Ambulatory Visit | Attending: Family Medicine | Admitting: Family Medicine

## 2024-05-23 DIAGNOSIS — Z1231 Encounter for screening mammogram for malignant neoplasm of breast: Secondary | ICD-10-CM | POA: Insufficient documentation

## 2024-05-27 ENCOUNTER — Ambulatory Visit: Payer: Self-pay | Admitting: Family Medicine
